# Patient Record
Sex: Male | Born: 1980
Health system: Southern US, Community
[De-identification: ages and names within clinical notes are randomized; demographics above are authoritative.]

## PROBLEM LIST (undated history)

## (undated) DIAGNOSIS — L409 Psoriasis, unspecified: Secondary | ICD-10-CM

## (undated) DIAGNOSIS — G8929 Other chronic pain: Secondary | ICD-10-CM

## (undated) DIAGNOSIS — D509 Iron deficiency anemia, unspecified: Secondary | ICD-10-CM

## (undated) DIAGNOSIS — D696 Thrombocytopenia, unspecified: Secondary | ICD-10-CM

## (undated) DIAGNOSIS — F419 Anxiety disorder, unspecified: Secondary | ICD-10-CM

## (undated) HISTORY — PX: GYNECOMASTIA EXCISION: SHX5170

## (undated) HISTORY — DX: Anxiety disorder, unspecified: F41.9

## (undated) HISTORY — PX: HERNIA REPAIR: SHX51

---

## 2013-07-20 ENCOUNTER — Encounter: Payer: Self-pay | Admitting: Internal Medicine

## 2013-07-20 ENCOUNTER — Ambulatory Visit (INDEPENDENT_AMBULATORY_CARE_PROVIDER_SITE_OTHER): Payer: 59 | Admitting: Internal Medicine

## 2013-07-20 VITALS — BP 120/80 | HR 84 | Temp 97.9°F | Ht 71.0 in | Wt 209.0 lb

## 2013-07-20 DIAGNOSIS — Z0000 Encounter for general adult medical examination without abnormal findings: Secondary | ICD-10-CM

## 2013-07-20 DIAGNOSIS — L723 Sebaceous cyst: Secondary | ICD-10-CM | POA: Insufficient documentation

## 2013-07-20 DIAGNOSIS — B354 Tinea corporis: Secondary | ICD-10-CM | POA: Insufficient documentation

## 2013-07-20 MED ORDER — KETOCONAZOLE 2 % EX FOAM: 1.0000 "application " | Freq: Every day | CUTANEOUS | Status: DC

## 2013-07-20 NOTE — Assessment & Plan Note (Signed)
eRx for ketaconazole cream

## 2013-07-20 NOTE — Patient Instructions (Signed)
 Preventive Care for Adults, Male A healthy lifestyle and preventive care can promote health and wellness. Preventive health guidelines for men include the following key practices:  A routine yearly physical is a good way to check with your caregiver about your health and preventative screening. It is a chance to share any concerns and updates on your health, and to receive a thorough exam.  Visit your dentist for a routine exam and preventative care every 6 months. Brush your teeth twice a day and floss once a day. Good oral hygiene prevents tooth decay and gum disease.  The frequency of eye exams is based on your age, health, family medical history, use of contact lenses, and other factors. Follow your caregiver's recommendations for frequency of eye exams.  Eat a healthy diet. Foods like vegetables, fruits, whole grains, low-fat dairy products, and lean protein foods contain the nutrients you need without too many calories. Decrease your intake of foods high in solid fats, added sugars, and salt. Eat the right amount of calories for you.Get information about a proper diet from your caregiver, if necessary.  Regular physical exercise is one of the most important things you can do for your health. Most adults should get at least 150 minutes of moderate-intensity exercise (any activity that increases your heart rate and causes you to sweat) each week. In addition, most adults need muscle-strengthening exercises on 2 or more days a week.  Maintain a healthy weight. The body mass index (BMI) is a screening tool to identify possible weight problems. It provides an estimate of body fat based on height and weight. Your caregiver can help determine your BMI, and can help you achieve or maintain a healthy weight.For adults 20 years and older:  A BMI below 18.5 is considered underweight.  A BMI of 18.5 to 24.9 is normal.  A BMI of 25 to 29.9 is considered overweight.  A BMI of 30 and above is  considered obese.  Maintain normal blood lipids and cholesterol levels by exercising and minimizing your intake of saturated fat. Eat a balanced diet with plenty of fruit and vegetables. Blood tests for lipids and cholesterol should begin at age 20 and be repeated every 5 years. If your lipid or cholesterol levels are high, you are over 50, or you are a high risk for heart disease, you may need your cholesterol levels checked more frequently.Ongoing high lipid and cholesterol levels should be treated with medicines if diet and exercise are not effective.  If you smoke, find out from your caregiver how to quit. If you do not use tobacco, do not start.  Lung cancer screening is recommended for adults aged 55 80 years who are at high risk for developing lung cancer because of a history of smoking. Yearly low-dose computed tomography (CT) is recommended for people who have at least a 30-pack-year history of smoking and are a current smoker or have quit within the past 15 years. A pack year of smoking is smoking an average of 1 pack of cigarettes a day for 1 year (for example: 1 pack a day for 30 years or 2 packs a day for 15 years). Yearly screening should continue until the smoker has stopped smoking for at least 15 years. Yearly screening should also be stopped for people who develop a health problem that would prevent them from having lung cancer treatment.  If you choose to drink alcohol, do not exceed 2 drinks per day. One drink is considered to be 12   ounces (355 mL) of beer, 5 ounces (148 mL) of wine, or 1.5 ounces (44 mL) of liquor.  Avoid use of street drugs. Do not share needles with anyone. Ask for help if you need support or instructions about stopping the use of drugs.  High blood pressure causes heart disease and increases the risk of stroke. Your blood pressure should be checked at least every 1 to 2 years. Ongoing high blood pressure should be treated with medicines, if weight loss and  exercise are not effective.  If you are 45 to 31 years old, ask your caregiver if you should take aspirin to prevent heart disease.  Diabetes screening involves taking a blood sample to check your fasting blood sugar level. This should be done once every 3 years, after age 45, if you are within normal weight and without risk factors for diabetes. Testing should be considered at a younger age or be carried out more frequently if you are overweight and have at least 1 risk factor for diabetes.  Colorectal cancer can be detected and often prevented. Most routine colorectal cancer screening begins at the age of 50 and continues through age 75. However, your caregiver may recommend screening at an earlier age if you have risk factors for colon cancer. On a yearly basis, your caregiver may provide home test kits to check for hidden blood in the stool. Use of a small camera at the end of a tube, to directly examine the colon (sigmoidoscopy or colonoscopy), can detect the earliest forms of colorectal cancer. Talk to your caregiver about this at age 50, when routine screening begins. Direct examination of the colon should be repeated every 5 to 10 years through age 75, unless early forms of pre-cancerous polyps or small growths are found.  Hepatitis C blood testing is recommended for all people born from 1945 through 1965 and any individual with known risks for hepatitis C.  Practice safe sex. Use condoms and avoid high-risk sexual practices to reduce the spread of sexually transmitted infections (STIs). STIs include gonorrhea, chlamydia, syphilis, trichomonas, herpes, HPV, and human immunodeficiency virus (HIV). Herpes, HIV, and HPV are viral illnesses that have no cure. They can result in disability, cancer, and death.  A one-time screening for abdominal aortic aneurysm (AAA) and surgical repair of large AAAs by sound wave imaging (ultrasonography) is recommended for ages 65 to 75 years who are current or  former smokers.  Healthy men should no longer receive prostate-specific antigen (PSA) blood tests as part of routine cancer screening. Consult with your caregiver about prostate cancer screening.  Testicular cancer screening is not recommended for adult males who have no symptoms. Screening includes self-exam, caregiver exam, and other screening tests. Consult with your caregiver about any symptoms you have or any concerns you have about testicular cancer.  Use sunscreen. Apply sunscreen liberally and repeatedly throughout the day. You should seek shade when your shadow is shorter than you. Protect yourself by wearing long sleeves, pants, a wide-brimmed hat, and sunglasses year round, whenever you are outdoors.  Once a month, do a whole body skin exam, using a mirror to look at the skin on your back. Notify your caregiver of new moles, moles that have irregular borders, moles that are larger than a pencil eraser, or moles that have changed in shape or color.  Stay current with required immunizations.  Influenza vaccine. All adults should be immunized every year.  Tetanus, diphtheria, and acellular pertussis (Td, Tdap) vaccine. An adult who has not   previously received Tdap or who does not know his vaccine status should receive 1 dose of Tdap. This initial dose should be followed by tetanus and diphtheria toxoids (Td) booster doses every 10 years. Adults with an unknown or incomplete history of completing a 3-dose immunization series with Td-containing vaccines should begin or complete a primary immunization series including a Tdap dose. Adults should receive a Td booster every 10 years.  Varicella vaccine. An adult without evidence of immunity to varicella should receive 2 doses or a second dose if he has previously received 1 dose.  Human papillomavirus (HPV) vaccine. Males aged 13 21 years who have not received the vaccine previously should receive the 3-dose series. Males aged 22 26 years may be  immunized. Immunization is recommended through the age of 26 years for any male who has sex with males and did not get any or all doses earlier. Immunization is recommended for any person with an immunocompromised condition through the age of 26 years if he did not get any or all doses earlier. During the 3-dose series, the second dose should be obtained 4 8 weeks after the first dose. The third dose should be obtained 24 weeks after the first dose and 16 weeks after the second dose.  Zoster vaccine. One dose is recommended for adults aged 60 years or older unless certain conditions are present.  Measles, mumps, and rubella (MMR) vaccine. Adults born before 1957 generally are considered immune to measles and mumps. Adults born in 1957 or later should have 1 or more doses of MMR vaccine unless there is a contraindication to the vaccine or there is laboratory evidence of immunity to each of the three diseases. A routine second dose of MMR vaccine should be obtained at least 28 days after the first dose for students attending postsecondary schools, health care workers, or international travelers. People who received inactivated measles vaccine or an unknown type of measles vaccine during 1963 1967 should receive 2 doses of MMR vaccine. People who received inactivated mumps vaccine or an unknown type of mumps vaccine before 1979 and are at high risk for mumps infection should consider immunization with 2 doses of MMR vaccine. Unvaccinated health care workers born before 1957 who lack laboratory evidence of measles, mumps, or rubella immunity or laboratory confirmation of disease should consider measles and mumps immunization with 2 doses of MMR vaccine or rubella immunization with 1 dose of MMR vaccine.  Pneumococcal 13-valent conjugate (PCV13) vaccine. When indicated, a person who is uncertain of his immunization history and has no record of immunization should receive the PCV13 vaccine. An adult aged 19 years or  older who has certain medical conditions and has not been previously immunized should receive 1 dose of PCV13 vaccine. This PCV13 should be followed with a dose of pneumococcal polysaccharide (PPSV23) vaccine. The PPSV23 vaccine dose should be obtained at least 8 weeks after the dose of PCV13 vaccine. An adult aged 19 years or older who has certain medical conditions and previously received 1 or more doses of PPSV23 vaccine should receive 1 dose of PCV13. The PCV13 vaccine dose should be obtained 1 or more years after the last PPSV23 vaccine dose.  Pneumococcal polysaccharide (PPSV23) vaccine. When PCV13 is also indicated, PCV13 should be obtained first. All adults aged 65 years and older should be immunized. An adult younger than age 65 years who has certain medical conditions should be immunized. Any person who resides in a nursing home or long-term care facility should be   immunized. An adult smoker should be immunized. People with an immunocompromised condition and certain other conditions should receive both PCV13 and PPSV23 vaccines. People with human immunodeficiency virus (HIV) infection should be immunized as soon as possible after diagnosis. Immunization during chemotherapy or radiation therapy should be avoided. Routine use of PPSV23 vaccine is not recommended for American Indians, [place] Natives, or people younger than 65 years unless there are medical conditions that require PPSV23 vaccine. When indicated, people who have unknown immunization and have no record of immunization should receive PPSV23 vaccine. One-time revaccination 5 years after the first dose of PPSV23 is recommended for people aged 19 64 years who have chronic kidney failure, nephrotic syndrome, asplenia, or immunocompromised conditions. People who received 1 2 doses of PPSV23 before age 65 years should receive another dose of PPSV23 vaccine at age 65 years or later if at least 5 years have passed since the previous dose. Doses of  PPSV23 are not needed for people immunized with PPSV23 at or after age 65 years.  Meningococcal vaccine. Adults with asplenia or persistent complement component deficiencies should receive 2 doses of quadrivalent meningococcal conjugate (MenACWY-D) vaccine. The doses should be obtained at least 2 months apart. Microbiologists working with certain meningococcal bacteria, military recruits, people at risk during an outbreak, and people who travel to or live in countries with a high rate of meningitis should be immunized. A first-year college student up through age 21 years who is living in a residence hall should receive a dose if he did not receive a dose on or after his 16th birthday. Adults who have certain high-risk conditions should receive one or more doses of vaccine.  Hepatitis A vaccine. Adults who wish to be protected from this disease, have certain high-risk conditions, work with hepatitis A-infected animals, work in hepatitis A research labs, or travel to or work in countries with a high rate of hepatitis A should be immunized. Adults who were previously unvaccinated and who anticipate close contact with an international adoptee during the first 60 days after arrival in the [place] from a country with a high rate of hepatitis A should be immunized.  Hepatitis B vaccine. Adults who wish to be protected from this disease, have certain high-risk conditions, may be exposed to blood or other infectious body fluids, are household contacts or sex partners of hepatitis B positive people, are clients or workers in certain care facilities, or travel to or work in countries with a high rate of hepatitis B should be immunized.  Haemophilus influenzae type b (Hib) vaccine. A previously unvaccinated person with asplenia or sickle cell disease or having a scheduled splenectomy should receive 1 dose of Hib vaccine. Regardless of previous immunization, a recipient of a hematopoietic stem cell transplant  should receive a 3-dose series 6 12 months after his successful transplant. Hib vaccine is not recommended for adults with HIV infection. Preventive Service / Frequency Ages 19 to 39  Blood pressure check.** / Every 1 to 2 years.  Lipid and cholesterol check.** / Every 5 years beginning at age 20.  Hepatitis C blood test.** / For any individual with known risks for hepatitis C.  Skin self-exam. / Monthly.  Influenza vaccine. / Every year.  Tetanus, diphtheria, and acellular pertussis (Tdap, Td) vaccine.** / Consult your caregiver. 1 dose of Td every 10 years.  Varicella vaccine.** / Consult your caregiver.  HPV vaccine. / 3 doses over 6 months, if 26 and younger.  Measles, mumps, rubella (MMR) vaccine.** /   You need at least 1 dose of MMR if you were born in 1957 or later. You may also need a 2nd dose.  Pneumococcal 13-valent conjugate (PCV13) vaccine.** / Consult your caregiver.  Pneumococcal polysaccharide (PPSV23) vaccine.** / 1 to 2 doses if you smoke cigarettes or if you have certain conditions.  Meningococcal vaccine.** / 1 dose if you are age 19 to 21 years and a first-year college student living in a residence hall, or have one of several medical conditions, you need to get vaccinated against meningococcal disease. You may also need additional booster doses.  Hepatitis A vaccine.** / Consult your caregiver.  Hepatitis B vaccine.** / Consult your caregiver.  Haemophilus influenzae type b (Hib) vaccine.** / Consult your caregiver. Ages 40 to 64  Blood pressure check.** / Every 1 to 2 years.  Lipid and cholesterol check.** / Every 5 years beginning at age 20.  Lung cancer screening. / Every year if you are aged 55 80 years and have a 30-pack-year history of smoking and currently smoke or have quit within the past 15 years. Yearly screening is stopped once you have quit smoking for at least 15 years or develop a health problem that would prevent you from having lung cancer  treatment.  Fecal occult blood test (FOBT) of stool. / Every year beginning at age 50 and continuing until age 75. You may not have to do this test if you get colonoscopy every 10 years.  Flexible sigmoidoscopy** or colonoscopy.** / Every 5 years for a flexible sigmoidoscopy or every 10 years for a colonoscopy beginning at age 50 and continuing until age 75.  Hepatitis C blood test.** / For all people born from 1945 through 1965 and any individual with known risks for hepatitis C.  Skin self-exam. / Monthly.  Influenza vaccine. / Every year.  Tetanus, diphtheria, and acellular pertussis (Tdap/Td) vaccine.** / Consult your caregiver. 1 dose of Td every 10 years.  Varicella vaccine.** / Consult your caregiver.  Zoster vaccine.** / 1 dose for adults aged 60 years or older.  Measles, mumps, rubella (MMR) vaccine.** / You need at least 1 dose of MMR if you were born in 1957 or later. You may also need a 2nd dose.  Pneumococcal 13-valent conjugate (PCV13) vaccine.** / Consult your caregiver.  Pneumococcal polysaccharide (PPSV23) vaccine.** / 1 to 2 doses if you smoke cigarettes or if you have certain conditions.  Meningococcal vaccine.** / Consult your caregiver.  Hepatitis A vaccine.** / Consult your caregiver.  Hepatitis B vaccine.** / Consult your caregiver.  Haemophilus influenzae type b (Hib) vaccine.** / Consult your caregiver. Ages 65 and over  Blood pressure check.** / Every 1 to 2 years.  Lipid and cholesterol check.**/ Every 5 years beginning at age 20.  Lung cancer screening. / Every year if you are aged 55 80 years and have a 30-pack-year history of smoking and currently smoke or have quit within the past 15 years. Yearly screening is stopped once you have quit smoking for at least 15 years or develop a health problem that would prevent you from having lung cancer treatment.  Fecal occult blood test (FOBT) of stool. / Every year beginning at age 50 and continuing until  age 75. You may not have to do this test if you get colonoscopy every 10 years.  Flexible sigmoidoscopy** or colonoscopy.** / Every 5 years for a flexible sigmoidoscopy or every 10 years for a colonoscopy beginning at age 50 and continuing until age 75.  Hepatitis C blood   test.** / For all people born from 1945 through 1965 and any individual with known risks for hepatitis C.  Abdominal aortic aneurysm (AAA) screening.** / A one-time screening for ages 65 to 75 years who are current or former smokers.  Skin self-exam. / Monthly.  Influenza vaccine. / Every year.  Tetanus, diphtheria, and acellular pertussis (Tdap/Td) vaccine.** / 1 dose of Td every 10 years.  Varicella vaccine.** / Consult your caregiver.  Zoster vaccine.** / 1 dose for adults aged 60 years or older.  Pneumococcal 13-valent conjugate (PCV13) vaccine.** / Consult your caregiver.  Pneumococcal polysaccharide (PPSV23) vaccine.** / 1 dose for all adults aged 65 years and older.  Meningococcal vaccine.** / Consult your caregiver.  Hepatitis A vaccine.** / Consult your caregiver.  Hepatitis B vaccine.** / Consult your caregiver.  Haemophilus influenzae type b (Hib) vaccine.** / Consult your caregiver. **Family history and personal history of risk and conditions may change your caregiver's recommendations. Document Released: 09/24/2001 Document Revised: 11/23/2012 Document Reviewed: 12/24/2010 [employer] Patient Information 2014 [employer].  

## 2013-07-20 NOTE — Progress Notes (Signed)
Subjective:    Patient ID: Patrick Hahn, male    DOB: Aug 10, 1981, 32 y.o.   MRN: [id number]  HPI Pt presents to the clinic today to establish care. He does not have a PCP. He does have some concerns today about a rash around his neck. It has been there for a few months. It does not itch or hurt. He also has a lump on his upper left chest. It has been there for years. It does not hurt. He would like to know what it is.  Flu: never Tetanus: around 2008 Dentist: as needed  Review of Systems      History reviewed. No pertinent past medical history.  No current outpatient prescriptions on file.   No current facility-administered medications for this visit.    No Known Allergies  Family History  Problem Relation Age of Onset  . Cancer Maternal Uncle     throat  . Diabetes Neg Hx   . Early death Neg Hx   . Heart disease Neg Hx   . Stroke Neg Hx     History   Social History  . Marital Status: Single    Spouse Name: N/A    Number of Children: N/A  . Years of Education: N/A   Occupational History  . Not on file.   Social History Main Topics  . Smoking status: Never Smoker   . Smokeless tobacco: Never Used  . Alcohol Use: 0.6 oz/week    1 Shots of liquor per week  . Drug Use: No  . Sexual Activity: Yes   Other Topics Concern  . Not on file   Social History Narrative  . No narrative on file     Constitutional: Denies fever, malaise, fatigue, headache or abrupt weight changes.  HEENT: Denies eye pain, eye redness, ear pain, ringing in the ears, wax buildup, runny nose, nasal congestion, bloody nose, or sore throat. Respiratory: Denies difficulty breathing, shortness of breath, cough or sputum production.   Cardiovascular: Denies chest pain, chest tightness, palpitations or swelling in the hands or feet.  Gastrointestinal: Denies abdominal pain, bloating, constipation, diarrhea or blood in the stool.  GU: Denies urgency, frequency, pain with urination, burning  sensation, blood in urine, odor or discharge. Musculoskeletal: Denies decrease in range of motion, difficulty with gait, muscle pain or joint pain and swelling.  Skin: Denies redness, lesions or ulcercations.  Neurological: Denies dizziness, difficulty with memory, difficulty with speech or problems with balance and coordination.   No other specific complaints in a complete review of systems (except as listed in HPI above).  Objective:   Physical Exam   BP 120/80  Pulse 84  Temp(Src) 97.9 F (36.6 C) (Tympanic)  Ht 5\' 11"  (1.803 m)  Wt 209 lb (94.802 kg)  BMI 29.16 kg/m2  SpO2 99% Wt Readings from Last 3 Encounters:  07/20/13 209 lb (94.802 kg)    General: Appears his stated age, well developed, well nourished in NAD. Skin: Warm, dry and intact. Fungal infection noted around neck, dime sized sebaceous cyst noted on left upper chest. HEENT: Head: normal shape and size; Eyes: sclera white, no icterus, conjunctiva pink, PERRLA and EOMs intact; Ears: Tm's gray and intact, normal light reflex; Nose: mucosa pink and moist, septum midline; Throat/Mouth: Teeth present, mucosa pink and moist, no exudate, lesions or ulcerations noted.  Neck: Normal range of motion. Neck supple, trachea midline. No massses, lumps or thyromegaly present.  Cardiovascular: Normal rate and rhythm. S1,S2 noted.  No murmur, rubs  or gallops noted. No JVD or BLE edema. No carotid bruits noted. Pulmonary/Chest: Normal effort and positive vesicular breath sounds. No respiratory distress. No wheezes, rales or ronchi noted.  Abdomen: Soft and nontender. Normal bowel sounds, no bruits noted. No distention or masses noted. Liver, spleen and kidneys non palpable. Musculoskeletal: Normal range of motion. No signs of joint swelling. No difficulty with gait.  Neurological: Alert and oriented. Cranial nerves II-XII intact. Coordination normal. +DTRs bilaterally. Psychiatric: Mood and affect normal. Behavior is normal. Judgment and  thought content normal.         Assessment & Plan:   Preventative Health Maintenance:  Pt declines flu vaccine today Will obtain basic screening labs Pt advised to work on diet and exercise

## 2013-07-20 NOTE — Progress Notes (Signed)
Pre-visit discussion using our clinic review tool. No additional management support is needed unless otherwise documented below in the visit note.  

## 2013-07-20 NOTE — Assessment & Plan Note (Signed)
Not infected Continue to monitor

## 2013-07-21 LAB — COMPREHENSIVE METABOLIC PANEL
Alkaline Phosphatase: 57 U/L (ref 39–117)
BUN: 11 mg/dL (ref 6–23)
Creatinine, Ser: 0.9 mg/dL (ref 0.4–1.5)
Glucose, Bld: 93 mg/dL (ref 70–99)
Sodium: 139 mEq/L (ref 135–145)
Total Bilirubin: 1.4 mg/dL — ABNORMAL HIGH (ref 0.3–1.2)

## 2013-07-21 LAB — LIPID PANEL
Cholesterol: 103 mg/dL (ref 0–200)
HDL: 40.1 mg/dL (ref >39.00)
LDL Cholesterol: 53 mg/dL (ref 0–99)
Triglycerides: 50 mg/dL (ref 0.0–149.0)
VLDL: 10 mg/dL (ref 0.0–40.0)

## 2013-07-21 LAB — CBC
HCT: 36.3 % — ABNORMAL LOW (ref 39.0–52.0)
MCV: 86.4 fl (ref 78.0–100.0)
Platelets: 153 10*3/uL (ref 150.0–400.0)
RBC: 4.2 Mil/uL — ABNORMAL LOW (ref 4.22–5.81)
RDW: 16.5 % — ABNORMAL HIGH (ref 11.5–14.6)

## 2013-07-23 ENCOUNTER — Encounter: Payer: Self-pay | Admitting: *Deleted

## 2013-09-09 ENCOUNTER — Ambulatory Visit (INDEPENDENT_AMBULATORY_CARE_PROVIDER_SITE_OTHER): Payer: 59 | Admitting: Internal Medicine

## 2013-09-09 ENCOUNTER — Encounter: Payer: Self-pay | Admitting: Internal Medicine

## 2013-09-09 VITALS — BP 129/80 | HR 91 | Temp 97.9°F | Wt 205.2 lb

## 2013-09-09 DIAGNOSIS — IMO0002 Reserved for concepts with insufficient information to code with codable children: Secondary | ICD-10-CM

## 2013-09-09 DIAGNOSIS — M7051 Other bursitis of knee, right knee: Secondary | ICD-10-CM

## 2013-09-09 MED ORDER — KETOCONAZOLE 2 % EX CREA
1.0000 | TOPICAL_CREAM | Freq: Every day | CUTANEOUS | Status: DC
Start: 2013-09-09 — End: 2014-11-29

## 2013-09-09 NOTE — Patient Instructions (Signed)
Bursitis Bursitis is a swelling and soreness (inflammation) of a fluid-filled sac (bursa) that overlies and protects a joint. It can be caused by injury, overuse of the joint, arthritis or infection. The joints most likely to be affected are the elbows, shoulders, hips and knees. HOME CARE INSTRUCTIONS   Apply ice to the affected area for 15-20 minutes each hour while awake for 2 days. Put the ice in a plastic bag and place a towel between the bag of ice and your skin.  Rest the injured joint as much as possible, but continue to put the joint through a full range of motion, 4 times per day. (The shoulder joint especially becomes rapidly "frozen" if not used.) When the pain lessens, begin normal slow movements and usual activities.  Only take over-the-counter or prescription medicines for pain, discomfort or fever as directed by your caregiver.  Your caregiver may recommend draining the bursa and injecting medicine into the bursa. This may help the healing process.  Follow all instructions for follow-up with your caregiver. This includes any orthopedic referrals, physical therapy and rehabilitation. Any delay in obtaining necessary care could result in a delay or failure of the bursitis to heal and chronic pain. SEEK IMMEDIATE MEDICAL CARE IF:   Your pain increases even during treatment.  You develop an oral temperature above 102 F (38.9 C) and have heat and inflammation over the involved bursa. MAKE SURE YOU:   Understand these instructions.  Will watch your condition.  Will get help right away if you are not doing well or get worse. Document Released: 07/26/2000 Document Revised: 10/21/2011 Document Reviewed: 06/30/2009 Exit[employer] Patient Information 2014 [employer].  

## 2013-09-09 NOTE — Progress Notes (Signed)
Pre-visit discussion using our clinic review tool. No additional management support is needed unless otherwise documented below in the visit note.  

## 2013-09-09 NOTE — Progress Notes (Signed)
Subjective:    Patient ID: Patrick Hahn, male    DOB: 08-29-1980, 33 y.o.   MRN: [id number]  HPI  Pt presents to the clinic today with c/o right knee pain. He reports this started 1 week ago. He did start working out at [employer]. He had been run/walking on the treadmill and playing basketball at that time. He did notice some soreness at that time. Then yesterday his pain had significantly increased. He did have pain with bending the knee. He denies any specific injury. He did use icy hot without much relief. He denies numbness or tingling in the leg, redness or swelling.  Review of Systems      History reviewed. No pertinent past medical history.  Current Outpatient Prescriptions  Medication Sig Dispense Refill  . Ketoconazole 2 % FOAM Apply 1 application topically daily.  100 g  0   No current facility-administered medications for this visit.    No Known Allergies  Family History  Problem Relation Age of Onset  . Cancer Maternal Uncle     throat  . Diabetes Neg Hx   . Early death Neg Hx   . Heart disease Neg Hx   . Stroke Neg Hx     History   Social History  . Marital Status: Single    Spouse Name: N/A    Number of Children: N/A  . Years of Education: N/A   Occupational History  . Not on file.   Social History Main Topics  . Smoking status: Never Smoker   . Smokeless tobacco: Never Used  . Alcohol Use: 0.6 oz/week    1 Shots of liquor per week  . Drug Use: No  . Sexual Activity: Yes   Other Topics Concern  . Not on file   Social History Narrative  . No narrative on file     Constitutional: Denies fever, malaise, fatigue, headache or abrupt weight changes.  Musculoskeletal: Pt reports right knee pain. Denies muscle pain or joint pain and swelling.  Skin: Denies redness, rashes, lesions or ulcercations.    No other specific complaints in a complete review of systems (except as listed in HPI above).  Objective:   Physical Exam   BP 129/80   Pulse 91  Temp(Src) 97.9 F (36.6 C) (Oral)  Wt 205 lb 4 oz (93.101 kg)  SpO2 98% Wt Readings from Last 3 Encounters:  09/09/13 205 lb 4 oz (93.101 kg)  07/20/13 209 lb (94.802 kg)    General: Appears his stated age, well developed, well nourished in NAD. Cardiovascular: Normal rate and rhythm. S1,S2 noted.  No murmur, rubs or gallops noted. No JVD or BLE edema. No carotid bruits noted. Pulmonary/Chest: Normal effort and positive vesicular breath sounds. No respiratory distress. No wheezes, rales or ronchi noted.  Musculoskeletal: Normal flexion and extension of the right knee. Pain noted at the medial bursa of the right knee. Strength 5/5 BLE. Negative Lachman's, negative Mcmurry.  BMET    Component Value Date/Time   NA 139 07/20/2013 1539   K 3.7 07/20/2013 1539   CL 105 07/20/2013 1539   CO2 26 07/20/2013 1539   GLUCOSE 93 07/20/2013 1539   BUN 11 07/20/2013 1539   CREATININE 0.9 07/20/2013 1539   CALCIUM 8.8 07/20/2013 1539    Lipid Panel     Component Value Date/Time   CHOL 103 07/20/2013 1539   TRIG 50.0 07/20/2013 1539   HDL 40.10 07/20/2013 1539   CHOLHDL 3 07/20/2013 1539  VLDL 10.0 07/20/2013 1539   LDLCALC 53 07/20/2013 1539    CBC    Component Value Date/Time   WBC 5.7 07/20/2013 1539   RBC 4.20* 07/20/2013 1539   HGB 12.2* 07/20/2013 1539   HCT 36.3* 07/20/2013 1539   PLT 153.0 07/20/2013 1539   MCV 86.4 07/20/2013 1539   MCHC 33.6 07/20/2013 1539   RDW 16.5* 07/20/2013 1539    Hgb A1C No results found for this basename: HGBA1C        Assessment & Plan:   Pes Bursitis of right knee:  Discussed the different options He would like to try 600 mg Ibuprofen BID x 1 week- take with food Advised him to not walk/run on the treadmill this week If no improvement, will consider steroid injection  RTC as needed or if symptoms persist or worsen

## 2014-01-25 ENCOUNTER — Encounter: Payer: Self-pay | Admitting: Internal Medicine

## 2014-01-25 ENCOUNTER — Ambulatory Visit (INDEPENDENT_AMBULATORY_CARE_PROVIDER_SITE_OTHER): Payer: 59 | Admitting: Internal Medicine

## 2014-01-25 ENCOUNTER — Encounter (INDEPENDENT_AMBULATORY_CARE_PROVIDER_SITE_OTHER): Payer: Self-pay

## 2014-01-25 VITALS — BP 118/76 | HR 89 | Temp 98.1°F | Wt 207.0 lb

## 2014-01-25 DIAGNOSIS — M545 Low back pain, unspecified: Secondary | ICD-10-CM

## 2014-01-25 MED ORDER — MELOXICAM 15 MG PO TABS: 15.0000 mg | ORAL_TABLET | Freq: Every day | ORAL | Status: DC

## 2014-01-25 NOTE — Patient Instructions (Addendum)
Back Exercises These exercises may help you when beginning to rehabilitate your injury. Your symptoms may resolve with or without further involvement from your physician, physical therapist or athletic trainer. While completing these exercises, remember:   Restoring tissue flexibility helps normal motion to return to the joints. This allows healthier, less painful movement and activity.  An effective stretch should be held for at least 30 seconds.  A stretch should never be painful. You should only feel a gentle lengthening or release in the stretched tissue. STRETCH  Extension, Prone on Elbows   Lie on your stomach on the floor, a bed will be too soft. Place your palms about shoulder width apart and at the height of your head.  Place your elbows under your shoulders. If this is too painful, stack pillows under your chest.  Allow your body to relax so that your hips drop lower and make contact more completely with the floor.  Hold this position for __________ seconds.  Slowly return to lying flat on the floor. Repeat __________ times. Complete this exercise __________ times per day.  RANGE OF MOTION  Extension, Prone Press Ups   Lie on your stomach on the floor, a bed will be too soft. Place your palms about shoulder width apart and at the height of your head.  Keeping your back as relaxed as possible, slowly straighten your elbows while keeping your hips on the floor. You may adjust the placement of your hands to maximize your comfort. As you gain motion, your hands will come more underneath your shoulders.  Hold this position __________ seconds.  Slowly return to lying flat on the floor. Repeat __________ times. Complete this exercise __________ times per day.  RANGE OF MOTION- Quadruped, Neutral Spine   Assume a hands and knees position on a firm surface. Keep your hands under your shoulders and your knees under your hips. You may place padding under your knees for comfort.  Drop  your head and point your tail bone toward the ground below you. This will round out your low back like an angry cat. Hold this position for __________ seconds.  Slowly lift your head and release your tail bone so that your back sags into a large arch, like an old horse.  Hold this position for __________ seconds.  Repeat this until you feel limber in your low back.  Now, find your "sweet spot." This will be the most comfortable position somewhere between the two previous positions. This is your neutral spine. Once you have found this position, tense your stomach muscles to support your low back.  Hold this position for __________ seconds. Repeat __________ times. Complete this exercise __________ times per day.  STRETCH  Flexion, Single Knee to Chest   Lie on a firm bed or floor with both legs extended in front of you.  Keeping one leg in contact with the floor, bring your opposite knee to your chest. Hold your leg in place by either grabbing behind your thigh or at your knee.  Pull until you feel a gentle stretch in your low back. Hold __________ seconds.  Slowly release your grasp and repeat the exercise with the opposite side. Repeat __________ times. Complete this exercise __________ times per day.  STRETCH - Hamstrings, Standing  Stand or sit and extend your right / left leg, placing your foot on a chair or foot stool  Keeping a slight arch in your low back and your hips straight forward.  Lead with your chest and   lean forward at the waist until you feel a gentle stretch in the back of your right / left knee or thigh. (When done correctly, this exercise requires leaning only a small distance.)  Hold this position for __________ seconds. Repeat __________ times. Complete this stretch __________ times per day. STRENGTHENING  Deep Abdominals, Pelvic Tilt   Lie on a firm bed or floor. Keeping your legs in front of you, bend your knees so they are both pointed toward the ceiling and  your feet are flat on the floor.  Tense your lower abdominal muscles to press your low back into the floor. This motion will rotate your pelvis so that your tail bone is scooping upwards rather than pointing at your feet or into the floor.  With a gentle tension and even breathing, hold this position for __________ seconds. Repeat __________ times. Complete this exercise __________ times per day.  STRENGTHENING  Abdominals, Crunches   Lie on a firm bed or floor. Keeping your legs in front of you, bend your knees so they are both pointed toward the ceiling and your feet are flat on the floor. Cross your arms over your chest.  Slightly tip your chin down without bending your neck.  Tense your abdominals and slowly lift your trunk high enough to just clear your shoulder blades. Lifting higher can put excessive stress on the low back and does not further strengthen your abdominal muscles.  Control your return to the starting position. Repeat __________ times. Complete this exercise __________ times per day.  STRENGTHENING  Quadruped, Opposite UE/LE Lift   Assume a hands and knees position on a firm surface. Keep your hands under your shoulders and your knees under your hips. You may place padding under your knees for comfort.  Find your neutral spine and gently tense your abdominal muscles so that you can maintain this position. Your shoulders and hips should form a rectangle that is parallel with the floor and is not twisted.  Keeping your trunk steady, lift your right hand no higher than your shoulder and then your left leg no higher than your hip. Make sure you are not holding your breath. Hold this position __________ seconds.  Continuing to keep your abdominal muscles tense and your back steady, slowly return to your starting position. Repeat with the opposite arm and leg. Repeat __________ times. Complete this exercise __________ times per day. Document Released: 08/16/2005 Document  Revised: 10/21/2011 Document Reviewed: 11/10/2008 Exit[employer] Patient Information 2014 [employer].  

## 2014-01-25 NOTE — Progress Notes (Signed)
Subjective:    Patient ID: Patrick Hahn, male    DOB: 01/23/1981, 33 y.o.   MRN: [id number]  HPI  Pt presents to the clinic today with c/o low back pain. He reports this started 4 days ago. It has been intermittent over the last year. He describes the pain as sharp and stiff. It does not radiate. He denies any specific injury to the area. He was playing basketball when it occurred. He has tried OTC tylenol and tried a heating pad with some relief. He denies loss of bowel or bladder.  Review of Systems      No past medical history on file.  Current Outpatient Prescriptions  Medication Sig Dispense Refill  . ketoconazole (NIZORAL) 2 % cream Apply 1 application topically daily.  15 g  0   No current facility-administered medications for this visit.    No Known Allergies  Family History  Problem Relation Age of Onset  . Cancer Maternal Uncle     throat  . Diabetes Neg Hx   . Early death Neg Hx   . Heart disease Neg Hx   . Stroke Neg Hx     History   Social History  . Marital Status: Single    Spouse Name: N/A    Number of Children: N/A  . Years of Education: N/A   Occupational History  . Not on file.   Social History Main Topics  . Smoking status: Never Smoker   . Smokeless tobacco: Never Used  . Alcohol Use: 0.6 oz/week    1 Shots of liquor per week     Comment: occasional  . Drug Use: No  . Sexual Activity: Yes   Other Topics Concern  . Not on file   Social History Narrative  . No narrative on file     Constitutional: Denies fever, malaise, fatigue, headache or abrupt weight changes.  Musculoskeletal: Pt reports low back pain. Denies decrease in range of motion, difficulty with gait,  or joint pain and swelling.   Neurological: Denies  problems with balance and coordination.   No other specific complaints in a complete review of systems (except as listed in HPI above).  Objective:   Physical Exam  BP 118/76  Pulse 89  Temp(Src) 98.1 F (36.7  C) (Oral)  Wt 207 lb (93.895 kg)  SpO2 98% Wt Readings from Last 3 Encounters:  01/25/14 207 lb (93.895 kg)  09/09/13 205 lb 4 oz (93.101 kg)  07/20/13 209 lb (94.802 kg)    General: Appears his stated age, well developed, well nourished in NAD. Cardiovascular: Normal rate and rhythm. S1,S2 noted.  No murmur, rubs or gallops noted. No JVD or BLE edema. No carotid bruits noted. Pulmonary/Chest: Normal effort and positive vesicular breath sounds. No respiratory distress. No wheezes, rales or ronchi noted.  Musculoskeletal: Normal flexion, extension and lateral rotation. Strength 5/5 BLE. Tender to palpation on the right lower back. Negative straight leg raise. Neurological: Alert and oriented.  Coordination normal. +DTRs bilaterally.   BMET    Component Value Date/Time   NA 139 07/20/2013 1539   K 3.7 07/20/2013 1539   CL 105 07/20/2013 1539   CO2 26 07/20/2013 1539   GLUCOSE 93 07/20/2013 1539   BUN 11 07/20/2013 1539   CREATININE 0.9 07/20/2013 1539   CALCIUM 8.8 07/20/2013 1539    Lipid Panel     Component Value Date/Time   CHOL 103 07/20/2013 1539   TRIG 50.0 07/20/2013 1539   HDL 40.10  07/20/2013 1539   CHOLHDL 3 07/20/2013 1539   VLDL 10.0 07/20/2013 1539   LDLCALC 53 07/20/2013 1539    CBC    Component Value Date/Time   WBC 5.7 07/20/2013 1539   RBC 4.20* 07/20/2013 1539   HGB 12.2* 07/20/2013 1539   HCT 36.3* 07/20/2013 1539   PLT 153.0 07/20/2013 1539   MCV 86.4 07/20/2013 1539   MCHC 33.6 07/20/2013 1539   RDW 16.5* 07/20/2013 1539    Hgb A1C No results found for this basename: HGBA1C         Assessment & Plan:   Muscle Strain of back:  eRx for Mobic daily-advised not to take with ibuprofen or aleve Stretching exercises given Continue tylenol and heating pad  RTC as needed or if symptoms persist or worsen

## 2014-01-25 NOTE — Progress Notes (Signed)
Pre visit review using our clinic review tool, if applicable. No additional management support is needed unless otherwise documented below in the visit note. 

## 2014-11-16 ENCOUNTER — Telehealth: Payer: Self-pay | Admitting: Internal Medicine

## 2014-11-16 NOTE — Telephone Encounter (Signed)
Have him cancel lab appt prior to physical appt, will do all labs same day

## 2014-11-16 NOTE — Telephone Encounter (Signed)
Pt called he has appointment 11/23/14 for cpx labs.  He would like to be tested for STD also

## 2014-11-18 ENCOUNTER — Other Ambulatory Visit: Payer: Self-pay | Admitting: Internal Medicine

## 2014-11-18 DIAGNOSIS — Z0000 Encounter for general adult medical examination without abnormal findings: Secondary | ICD-10-CM

## 2014-11-18 NOTE — Telephone Encounter (Signed)
Pt called back Patrick Hahn  Canceled lab appointment

## 2014-11-18 NOTE — Telephone Encounter (Signed)
Left message for pt to call office

## 2014-11-23 ENCOUNTER — Other Ambulatory Visit: Payer: Self-pay

## 2014-11-29 ENCOUNTER — Ambulatory Visit (INDEPENDENT_AMBULATORY_CARE_PROVIDER_SITE_OTHER): Payer: 59 | Admitting: Internal Medicine

## 2014-11-29 ENCOUNTER — Encounter: Payer: Self-pay | Admitting: Internal Medicine

## 2014-11-29 VITALS — BP 116/78 | HR 81 | Temp 98.1°F | Ht 71.0 in | Wt 199.0 lb

## 2014-11-29 DIAGNOSIS — R229 Localized swelling, mass and lump, unspecified: Secondary | ICD-10-CM | POA: Diagnosis not present

## 2014-11-29 DIAGNOSIS — M545 Low back pain, unspecified: Secondary | ICD-10-CM

## 2014-11-29 DIAGNOSIS — Z113 Encounter for screening for infections with a predominantly sexual mode of transmission: Secondary | ICD-10-CM

## 2014-11-29 DIAGNOSIS — Z0000 Encounter for general adult medical examination without abnormal findings: Secondary | ICD-10-CM | POA: Diagnosis not present

## 2014-11-29 DIAGNOSIS — M25511 Pain in right shoulder: Secondary | ICD-10-CM | POA: Diagnosis not present

## 2014-11-29 LAB — COMPREHENSIVE METABOLIC PANEL
ALBUMIN: 4 g/dL (ref 3.5–5.2)
ALK PHOS: 49 U/L (ref 39–117)
ALT: 15 U/L (ref 0–53)
AST: 20 U/L (ref 0–37)
BUN: 9 mg/dL (ref 6–23)
CHLORIDE: 102 meq/L (ref 96–112)
CO2: 30 meq/L (ref 19–32)
Calcium: 9.1 mg/dL (ref 8.4–10.5)
Creatinine, Ser: 0.94 mg/dL (ref 0.40–1.50)
GFR: 119 mL/min (ref >60.00)
Glucose, Bld: 88 mg/dL (ref 70–99)
Potassium: 3.9 mEq/L (ref 3.5–5.1)
SODIUM: 136 meq/L (ref 135–145)
Total Bilirubin: 2.1 mg/dL — ABNORMAL HIGH (ref 0.2–1.2)
Total Protein: 7 g/dL (ref 6.0–8.3)

## 2014-11-29 LAB — LIPID PANEL
Cholesterol: 88 mg/dL (ref 0–200)
HDL: 39.5 mg/dL (ref >39.00)
LDL CALC: 43 mg/dL (ref 0–99)
NonHDL: 48.5
TRIGLYCERIDES: 28 mg/dL (ref 0.0–149.0)
Total CHOL/HDL Ratio: 2
VLDL: 5.6 mg/dL (ref 0.0–40.0)

## 2014-11-29 LAB — CBC
HCT: 35.5 % — ABNORMAL LOW (ref 39.0–52.0)
Hemoglobin: 12 g/dL — ABNORMAL LOW (ref 13.0–17.0)
MCHC: 33.9 g/dL (ref 30.0–36.0)
MCV: 84.8 fl (ref 78.0–100.0)
PLATELETS: 148 10*3/uL — AB (ref 150.0–400.0)
RBC: 4.18 Mil/uL — ABNORMAL LOW (ref 4.22–5.81)
RDW: 16.8 % — AB (ref 11.5–15.5)
WBC: 4.6 10*3/uL (ref 4.0–10.5)

## 2014-11-29 NOTE — Patient Instructions (Signed)

## 2014-11-29 NOTE — Addendum Note (Signed)
Addended by: Alvina ChouWALSH, [place] on: 11/29/2014 09:54 AM   Modules accepted: Orders

## 2014-11-29 NOTE — Progress Notes (Signed)
Pre visit review using our clinic review tool, if applicable. No additional management support is needed unless otherwise documented below in the visit note. 

## 2014-11-29 NOTE — Progress Notes (Signed)
Subjective:    Patient ID: Patrick Hahn, male    DOB: 25-Nov-1980, 34 y.o.   MRN: [id number]  HPI  Pt presents to the clinic today for his annual exam.  Flu: never Tetanus: He thinks it may have been in 2008. Dentist: annually  Diet: He eats cereal for breakfast, yogurt, granola, raisins, salad and water Exercise: He is walking every day of the week. He has a fit bit.  He does have some concerns today about right shoulder pain. This started 4 days ago. He describes the pain as achy. It is worse with certain movements. He was at work when this happened, lifting a heavy box. He denies numbness or tingling is his right arm. He does not feel in weakness in the right arm. He has not tried anything OTC.  He still continues to have low back pain. This started around 2009 when he strained a muscle while playing basketball. Since that time he has had intermittent back pain. It flares occasionally. The pain is worse on the sides, left worse than right. He reports it feels tight. He does have some associated muscle spasms. The pain does not radiate down his legs. He denies numbness or tingling. When it flares up, he is usually out of work for 2 days. He treats with Ibuprofen, heat and stretching and the pain usually eases off after a few days. He has FMLA forms he would like to have filled out in case this flares.  He continues to be concerned about this subcutaneous mass on his left chest. It has not grown in size. It is not painful. He is concerned because he reports cancer runs in his family, although he cannot specify what type. He would like further evaluation today.  Review of Systems      No past medical history on file.  Current Outpatient Prescriptions  Medication Sig Dispense Refill  . ketoconazole (NIZORAL) 2 % cream Apply 1 application topically daily. 15 g 0  . meloxicam (MOBIC) 15 MG tablet Take 1 tablet (15 mg total) by mouth daily. 30 tablet 0   No current facility-administered  medications for this visit.    No Known Allergies  Family History  Problem Relation Age of Onset  . Cancer Maternal Uncle     throat  . Diabetes Neg Hx   . Early death Neg Hx   . Heart disease Neg Hx   . Stroke Neg Hx     History   Social History  . Marital Status: Single    Spouse Name: N/A  . Number of Children: N/A  . Years of Education: N/A   Occupational History  . Not on file.   Social History Main Topics  . Smoking status: Never Smoker   . Smokeless tobacco: Never Used  . Alcohol Use: 0.6 oz/week    1 Shots of liquor per week     Comment: occasional  . Drug Use: No  . Sexual Activity: Yes   Other Topics Concern  . Not on file   Social History Narrative  . No narrative on file     Constitutional: Denies fever, malaise, fatigue, headache or abrupt weight changes.  HEENT: Denies eye pain, eye redness, ear pain, ringing in the ears, wax buildup, runny nose, nasal congestion, bloody nose, or sore throat. Respiratory: Denies difficulty breathing, shortness of breath, cough or sputum production.   Cardiovascular: Denies chest pain, chest tightness, palpitations or swelling in the hands or feet.  Gastrointestinal: Denies abdominal pain,  bloating, constipation, diarrhea or blood in the stool.  GU: Denies urgency, frequency, pain with urination, burning sensation, blood in urine, odor or discharge. Musculoskeletal: Pt reports right shoulder pain. Denies decrease in range of motion, difficulty with gait, muscle pain or joint swelling.  Skin: Pt reports mass of left chest wall. Denies redness, rashes, lesions or ulcercations.  Neurological: Denies dizziness, difficulty with memory, difficulty with speech or problems with balance and coordination.  Psych: Pt denies anxiety, depression, SI/HI.  No other specific complaints in a complete review of systems (except as listed in HPI above).  Objective:   Physical Exam  BP 116/78 mmHg  Pulse 81  Temp(Src) 98.1 F  (36.7 C) (Oral)  Ht  (1.803 m)  Wt 199 lb (90.266 kg)  BMI 27.77 kg/m2  SpO2 99% Wt Readings from Last 3 Encounters:  11/29/14 199 lb (90.266 kg)  01/25/14 207 lb (93.895 kg)  09/09/13 205 lb 4 oz (93.101 kg)    General: Appears his stated age, well developed, well nourished in NAD. Skin: Warm, dry and intact. Small, round 1 cm, mobile subcutaneous mass located about 1 cm below the left clavicle, next to the sternal border. HEENT: Head: normal shape and size; Eyes: sclera white, no icterus, conjunctiva pink, PERRLA and EOMs intact; Ears: Tm's gray and intact, normal light reflex; Nose: mucosa pink and moist, septum midline; Throat/Mouth: Teeth present, mucosa pink and moist, no exudate, lesions or ulcerations noted.  Neck:  Neck supple, trachea midline. No masses, lumps or thyromegaly present.  Cardiovascular: Normal rate and rhythm. S1,S2 noted.  No murmur, rubs or gallops noted. No BLE edema.  Pulmonary/Chest: Normal effort and positive vesicular breath sounds. No respiratory distress. No wheezes, rales or ronchi noted.  Abdomen: Soft and nontender. Normal bowel sounds, no bruits noted. No distention or masses noted. Liver, spleen and kidneys non palpable. Musculoskeletal: Normal internal and external rotation of the right shoulder. No pain with palpation. No joint swelling noted. Negative drop can test. Strength 5/5 BUE. Normal flexion, extension and rotation of the spine. No pain with palpation of the paraspinal muscles. Strength 5/5 BLE. No difficulty with gait. Neurological: Alert and oriented. Cranial nerves II-XII grossly intact. Coordination normal.  Psychiatric: Mood and affect normal. Behavior is normal. Judgment and thought content normal.     BMET    Component Value Date/Time   NA 139 07/20/2013 1539   K 3.7 07/20/2013 1539   CL 105 07/20/2013 1539   CO2 26 07/20/2013 1539   GLUCOSE 93 07/20/2013 1539   BUN 11 07/20/2013 1539   CREATININE 0.9 07/20/2013 1539    CALCIUM 8.8 07/20/2013 1539    Lipid Panel     Component Value Date/Time   CHOL 103 07/20/2013 1539   TRIG 50.0 07/20/2013 1539   HDL 40.10 07/20/2013 1539   CHOLHDL 3 07/20/2013 1539   VLDL 10.0 07/20/2013 1539   LDLCALC 53 07/20/2013 1539    CBC    Component Value Date/Time   WBC 5.7 07/20/2013 1539   RBC 4.20* 07/20/2013 1539   HGB 12.2* 07/20/2013 1539   HCT 36.3* 07/20/2013 1539   PLT 153.0 07/20/2013 1539   MCV 86.4 07/20/2013 1539   MCHC 33.6 07/20/2013 1539   RDW 16.5* 07/20/2013 1539    Hgb A1C No results found for: HGBA1C      Assessment & Plan:   Preventative Health Maintenance:  Will obtain CBC, CMET, and Lipid Profile today Encouraged him to work on diet and exercise He  would like STD screening, HIV, RPR, Hep C, urine GC probe  Right shoulder pain:  Likely strained Exam normal today  Discussed proper lifting mechanics Ibuprofen as needed for joint pain If it flares up again, ice may be helpful  Subcutaneous mass:  Likely a sebaceous cyst He is concerned, will obtain ultrasound of soft tissue RTC in 1 year or sooner if needed  Low back pain:  Muscular in nature Probably related to poor ergonomics Discussed proper lifting mechanics Stretching exercises given when it flares, focus on core strengthening Ibuprofen as needed for pain FMLA forms will be filled out, we will call you when these are ready  RTC in 1 year or sooner if needed

## 2014-11-30 ENCOUNTER — Telehealth: Payer: Self-pay | Admitting: Internal Medicine

## 2014-11-30 DIAGNOSIS — Z7689 Persons encountering health services in other specified circumstances: Secondary | ICD-10-CM

## 2014-11-30 LAB — HEPATITIS C ANTIBODY: HCV Ab: NEGATIVE

## 2014-11-30 LAB — GC/CHLAMYDIA PROBE AMP, URINE
Chlamydia, Swab/Urine, PCR: NEGATIVE
GC Probe Amp, Urine: NEGATIVE

## 2014-11-30 LAB — RPR

## 2014-11-30 LAB — HIV ANTIBODY (ROUTINE TESTING W REFLEX): HIV 1&2 Ab, 4th Generation: NONREACTIVE

## 2014-11-30 NOTE — Telephone Encounter (Signed)
fmla paperwork In [name] IN BOX To be reviewed and signed

## 2014-11-30 NOTE — Telephone Encounter (Signed)
Forms signed and in outbox to give to robin for review

## 2014-11-30 NOTE — Telephone Encounter (Signed)
FMLA forms to be filled out for low back pain, given to Patrick Hahn

## 2014-11-30 NOTE — Telephone Encounter (Signed)
Left message asking pt to call office  °

## 2014-12-01 NOTE — Telephone Encounter (Signed)
Left message for pt to call office.  Please let pt know his FMLA paper work is ready for him to pick up  Copy for patient Copy  For charge Copy for care  Copy for file

## 2014-12-01 NOTE — Telephone Encounter (Signed)
Pt aware paperwork ready for pick up 

## 2014-12-05 ENCOUNTER — Ambulatory Visit
Admit: 2014-12-05 | Disposition: A | Discharge status: Home / Self Care | Payer: Self-pay | Attending: Internal Medicine | Admitting: Internal Medicine

## 2014-12-06 ENCOUNTER — Encounter: Payer: Self-pay | Admitting: Internal Medicine

## 2014-12-15 ENCOUNTER — Other Ambulatory Visit: Payer: Self-pay | Admitting: Internal Medicine

## 2015-04-18 ENCOUNTER — Encounter: Payer: Self-pay | Admitting: Internal Medicine

## 2015-04-18 ENCOUNTER — Ambulatory Visit (INDEPENDENT_AMBULATORY_CARE_PROVIDER_SITE_OTHER): Payer: 59 | Admitting: Internal Medicine

## 2015-04-18 VITALS — BP 118/70 | HR 98 | Temp 98.5°F | Wt 207.0 lb

## 2015-04-18 DIAGNOSIS — M545 Low back pain, unspecified: Secondary | ICD-10-CM

## 2015-04-18 MED ORDER — METHOCARBAMOL 500 MG PO TABS: 500.0000 mg | ORAL_TABLET | Freq: Three times a day (TID) | ORAL | Status: DC

## 2015-04-18 MED ORDER — IBUPROFEN 600 MG PO TABS: 600.0000 mg | ORAL_TABLET | Freq: Three times a day (TID) | ORAL | Status: DC | PRN

## 2015-04-18 NOTE — Progress Notes (Signed)
Subjective:    Patient ID: Patrick Hahn, male    DOB: February 28, 1981, 34 y.o.   MRN: [id number]  HPI  Pt presents to the clinic today with c/o low back pain. This started 2-3 days ago. He describes the pain as tightness. The pain is worse with standing. The pain does not radiate. He denies numbness or tingling. He denies loss of bowel or bladder. He does have an old injury from 5-6 years ago that flares up every now and again. He has tried Tylenol without much relief. He has also been wearing a back brace and been stretching. He is concerned because he has to do a lot of heavy lifting for work.  Review of Systems  No past medical history on file.  No current outpatient prescriptions on file.   No current facility-administered medications for this visit.    No Known Allergies  Family History  Problem Relation Age of Onset  . Cancer Maternal Uncle     throat  . Diabetes Neg Hx   . Early death Neg Hx   . Heart disease Neg Hx   . Stroke Neg Hx     Social History   Social History  . Marital Status: Single    Spouse Name: N/A  . Number of Children: N/A  . Years of Education: N/A   Occupational History  . Not on file.   Social History Main Topics  . Smoking status: Never Smoker   . Smokeless tobacco: Never Used  . Alcohol Use: 0.6 oz/week    1 Shots of liquor per week     Comment: occasional  . Drug Use: No  . Sexual Activity: Yes   Other Topics Concern  . Not on file   Social History Narrative     Constitutional: Denies fever, malaise, fatigue, headache or abrupt weight changes.  Respiratory: Denies difficulty breathing, shortness of breath, cough or sputum production.   Cardiovascular: Denies chest pain, chest tightness, palpitations or swelling in the hands or feet.  Gastrointestinal: Denies abdominal pain, bloating, constipation, diarrhea or blood in the stool.  GU: Denies urgency, frequency, pain with urination, burning sensation, blood in urine, odor or  discharge. Musculoskeletal: Pt reports back pain. Denies difficulty with gait, or joint pain and swelling.   Neurological: Denies dizziness, difficulty with memory, difficulty with speech or problems with balance and coordination.    No other specific complaints in a complete review of systems (except as listed in HPI above).     Objective:   Physical Exam   BP 118/70 mmHg  Pulse 98  Temp(Src) 98.5 F (36.9 C) (Oral)  Wt 207 lb (93.895 kg)  SpO2 98% Wt Readings from Last 3 Encounters:  04/18/15 207 lb (93.895 kg)  11/29/14 199 lb (90.266 kg)  01/25/14 207 lb (93.895 kg)    General: Appears his stated age, well developed, well nourished in NAD. Cardiovascular: Normal rate and rhythm. S1,S2 noted.  No murmur, rubs or gallops noted.  Pulmonary/Chest: Normal effort and positive vesicular breath sounds. No respiratory distress. No wheezes, rales or ronchi noted.  Musculoskeletal: Normal flexion, extension and rotation of the spine. No pain with palpation of the lumbar spine. Pain with palpation of the right paralumbar muscles. Strength 5/5 BLE. No difficulty with gait.  Neurological: Alert and oriented. Sensation intact to BLE.   BMET    Component Value Date/Time   NA 136 11/29/2014 0953   K 3.9 11/29/2014 0953   CL 102 11/29/2014 0953   CO2  30 11/29/2014 0953   GLUCOSE 88 11/29/2014 0953   BUN 9 11/29/2014 0953   CREATININE 0.94 11/29/2014 0953   CALCIUM 9.1 11/29/2014 0953    Lipid Panel     Component Value Date/Time   CHOL 88 11/29/2014 0953   TRIG 28.0 11/29/2014 0953   HDL 39.50 11/29/2014 0953   CHOLHDL 2 11/29/2014 0953   VLDL 5.6 11/29/2014 0953   LDLCALC 43 11/29/2014 0953    CBC    Component Value Date/Time   WBC 4.6 11/29/2014 0953   RBC 4.18* 11/29/2014 0953   HGB 12.0* 11/29/2014 0953   HCT 35.5* 11/29/2014 0953   PLT 148.0* 11/29/2014 0953   MCV 84.8 11/29/2014 0953   MCHC 33.9 11/29/2014 0953   RDW 16.8* 11/29/2014 0953    Hgb A1C No  results found for: HGBA1C      Assessment & Plan:   Low back pain, right side, without sciatica:  Stop Tylenol Start Ibuprofen, RX given eRx for Robaxin 500 mg TID prn Work note to return Monday Handout given for stretching exercises Reassurance that this should resolve with time  RTC as needed or if symptoms persist or worsen

## 2015-04-18 NOTE — Progress Notes (Signed)
Pre visit review using our clinic review tool, if applicable. No additional management support is needed unless otherwise documented below in the visit note. 

## 2015-04-18 NOTE — Patient Instructions (Signed)
Back Exercises These exercises may help you when beginning to rehabilitate your injury. Your symptoms may resolve with or without further involvement from your physician, physical therapist or athletic trainer. While completing these exercises, remember:   Restoring tissue flexibility helps normal motion to return to the joints. This allows healthier, less painful movement and activity.  An effective stretch should be held for at least 30 seconds.  A stretch should never be painful. You should only feel a gentle lengthening or release in the stretched tissue. STRETCH - Extension, Prone on Elbows   Lie on your stomach on the floor, a bed will be too soft. Place your palms about shoulder width apart and at the height of your head.  Place your elbows under your shoulders. If this is too painful, stack pillows under your chest.  Allow your body to relax so that your hips drop lower and make contact more completely with the floor.  Hold this position for __________ seconds.  Slowly return to lying flat on the floor. Repeat __________ times. Complete this exercise __________ times per day.  RANGE OF MOTION - Extension, Prone Press Ups   Lie on your stomach on the floor, a bed will be too soft. Place your palms about shoulder width apart and at the height of your head.  Keeping your back as relaxed as possible, slowly straighten your elbows while keeping your hips on the floor. You may adjust the placement of your hands to maximize your comfort. As you gain motion, your hands will come more underneath your shoulders.  Hold this position __________ seconds.  Slowly return to lying flat on the floor. Repeat __________ times. Complete this exercise __________ times per day.  RANGE OF MOTION- Quadruped, Neutral Spine   Assume a hands and knees position on a firm surface. Keep your hands under your shoulders and your knees under your hips. You may place padding under your knees for  comfort.  Drop your head and point your tail bone toward the ground below you. This will round out your low back like an angry cat. Hold this position for __________ seconds.  Slowly lift your head and release your tail bone so that your back sags into a large arch, like an old horse.  Hold this position for __________ seconds.  Repeat this until you feel limber in your low back.  Now, find your "sweet spot." This will be the most comfortable position somewhere between the two previous positions. This is your neutral spine. Once you have found this position, tense your stomach muscles to support your low back.  Hold this position for __________ seconds. Repeat __________ times. Complete this exercise __________ times per day.  STRETCH - Flexion, Single Knee to Chest   Lie on a firm bed or floor with both legs extended in front of you.  Keeping one leg in contact with the floor, bring your opposite knee to your chest. Hold your leg in place by either grabbing behind your thigh or at your knee.  Pull until you feel a gentle stretch in your low back. Hold __________ seconds.  Slowly release your grasp and repeat the exercise with the opposite side. Repeat __________ times. Complete this exercise __________ times per day.  STRETCH - Hamstrings, Standing  Stand or sit and extend your right / left leg, placing your foot on a chair or foot stool  Keeping a slight arch in your low back and your hips straight forward.  Lead with your chest and   lean forward at the waist until you feel a gentle stretch in the back of your right / left knee or thigh. (When done correctly, this exercise requires leaning only a small distance.)  Hold this position for __________ seconds. Repeat __________ times. Complete this stretch __________ times per day. STRENGTHENING - Deep Abdominals, Pelvic Tilt   Lie on a firm bed or floor. Keeping your legs in front of you, bend your knees so they are both pointed  toward the ceiling and your feet are flat on the floor.  Tense your lower abdominal muscles to press your low back into the floor. This motion will rotate your pelvis so that your tail bone is scooping upwards rather than pointing at your feet or into the floor.  With a gentle tension and even breathing, hold this position for __________ seconds. Repeat __________ times. Complete this exercise __________ times per day.  STRENGTHENING - Abdominals, Crunches   Lie on a firm bed or floor. Keeping your legs in front of you, bend your knees so they are both pointed toward the ceiling and your feet are flat on the floor. Cross your arms over your chest.  Slightly tip your chin down without bending your neck.  Tense your abdominals and slowly lift your trunk high enough to just clear your shoulder blades. Lifting higher can put excessive stress on the low back and does not further strengthen your abdominal muscles.  Control your return to the starting position. Repeat __________ times. Complete this exercise __________ times per day.  STRENGTHENING - Quadruped, Opposite UE/LE Lift   Assume a hands and knees position on a firm surface. Keep your hands under your shoulders and your knees under your hips. You may place padding under your knees for comfort.  Find your neutral spine and gently tense your abdominal muscles so that you can maintain this position. Your shoulders and hips should form a rectangle that is parallel with the floor and is not twisted.  Keeping your trunk steady, lift your right hand no higher than your shoulder and then your left leg no higher than your hip. Make sure you are not holding your breath. Hold this position __________ seconds.  Continuing to keep your abdominal muscles tense and your back steady, slowly return to your starting position. Repeat with the opposite arm and leg. Repeat __________ times. Complete this exercise __________ times per day. Document Released:  08/16/2005 Document Revised: 10/21/2011 Document Reviewed: 11/10/2008 [employer] Patient Information 2015 [employer]. This information is not intended to replace advice given to you by your health care provider. Make sure you discuss any questions you have with your health care provider.  

## 2015-05-24 ENCOUNTER — Ambulatory Visit (INDEPENDENT_AMBULATORY_CARE_PROVIDER_SITE_OTHER): Payer: 59 | Admitting: Internal Medicine

## 2015-05-24 ENCOUNTER — Encounter: Payer: Self-pay | Admitting: Internal Medicine

## 2015-05-24 VITALS — BP 108/64 | HR 100 | Temp 98.0°F | Wt 204.0 lb

## 2015-05-24 DIAGNOSIS — H01003 Unspecified blepharitis right eye, unspecified eyelid: Secondary | ICD-10-CM | POA: Diagnosis not present

## 2015-05-24 NOTE — Progress Notes (Signed)
Subjective:    Patient ID: Patrick Hahn, male    DOB: 1981/04/18, 34 y.o.   MRN: [id number]  HPI  Pt presents to the clinic today with c/o swelling in his right eye. He noticed this yesterday. It is painful. He denies blurred vision or eye drainage. He has not had any recent illness. He has not taken anything OTC. He has not had sick contacts that he is aware of.  Review of Systems      No past medical history on file.  Current Outpatient Prescriptions  Medication Sig Dispense Refill  . ibuprofen (ADVIL,MOTRIN) 600 MG tablet Take 1 tablet (600 mg total) by mouth every 8 (eight) hours as needed. 30 tablet 0  . methocarbamol (ROBAXIN) 500 MG tablet Take 1 tablet (500 mg total) by mouth 3 (three) times daily. 30 tablet 0   No current facility-administered medications for this visit.    No Known Allergies  Family History  Problem Relation Age of Onset  . Cancer Maternal Uncle     throat  . Diabetes Neg Hx   . Early death Neg Hx   . Heart disease Neg Hx   . Stroke Neg Hx     Social History   Social History  . Marital Status: Single    Spouse Name: N/A  . Number of Children: N/A  . Years of Education: N/A   Occupational History  . Not on file.   Social History Main Topics  . Smoking status: Never Smoker   . Smokeless tobacco: Never Used  . Alcohol Use: 0.6 oz/week    1 Shots of liquor per week     Comment: occasional  . Drug Use: No  . Sexual Activity: Yes   Other Topics Concern  . Not on file   Social History Narrative     Constitutional: Denies fever, malaise, fatigue, headache or abrupt weight changes.  HEENT: Pt reports eye swelling. Denies eye pain, eye redness, ear pain, ringing in the ears, wax buildup, runny nose, nasal congestion, bloody nose, or sore throat. Respiratory: Denies difficulty breathing, shortness of breath, cough or sputum production.   Cardiovascular: Denies chest pain, chest tightness, palpitations or swelling in the hands or feet.    No other specific complaints in a complete review of systems (except as listed in HPI above).  Objective:   Physical Exam  BP 108/64 mmHg  Pulse 100  Temp(Src) 98 F (36.7 C) (Oral)  Wt 204 lb (92.534 kg)  SpO2 98% Wt Readings from Last 3 Encounters:  05/24/15 204 lb (92.534 kg)  04/18/15 207 lb (93.895 kg)  11/29/14 199 lb (90.266 kg)    General: Appears his stated age, well developed, well nourished in NAD. Skin: Warm, dry and intact. No rashes, lesions or ulcerations noted. HEENT: Head: normal shape and size; Eyes: sclera white, no icterus, conjunctiva pink, PERRLA and EOMs intact; right lower lid swelling noted. Ears: Tm's gray and intact, normal light reflex;  Neck:  No adenopathy noted. Cardiovascular: Normal rate and rhythm. S1,S2 noted.   Pulmonary/Chest: Normal effort and positive vesicular breath sounds. No respiratory distress. No wheezes, rales or ronchi noted.    BMET    Component Value Date/Time   NA 136 11/29/2014 0953   K 3.9 11/29/2014 0953   CL 102 11/29/2014 0953   CO2 30 11/29/2014 0953   GLUCOSE 88 11/29/2014 0953   BUN 9 11/29/2014 0953   CREATININE 0.94 11/29/2014 0953   CALCIUM 9.1 11/29/2014 0953  Lipid Panel     Component Value Date/Time   CHOL 88 11/29/2014 0953   TRIG 28.0 11/29/2014 0953   HDL 39.50 11/29/2014 0953   CHOLHDL 2 11/29/2014 0953   VLDL 5.6 11/29/2014 0953   LDLCALC 43 11/29/2014 0953    CBC    Component Value Date/Time   WBC 4.6 11/29/2014 0953   RBC 4.18* 11/29/2014 0953   HGB 12.0* 11/29/2014 0953   HCT 35.5* 11/29/2014 0953   PLT 148.0* 11/29/2014 0953   MCV 84.8 11/29/2014 0953   MCHC 33.9 11/29/2014 0953   RDW 16.8* 11/29/2014 0953    Hgb A1C No results found for: HGBA1C       Assessment & Plan:   Blepharitis of right lower lid:  Reassurance given that this is self limiting and should resolve on its own Warm compresses TID Ibuprofen as needed for pain and swelling Return precautions  given  RTC as needed or if symptoms persist or worsen

## 2015-05-24 NOTE — Patient Instructions (Signed)
Blepharitis Blepharitis is inflammation of the eyelids. Blepharitis may happen with:  Reddish, scaly skin around the scalp and eyebrows.  Burning or itching of the eyelids.  Eye discharge at night that causes the eyelashes to stick together in the morning.  Eyelashes that fall out.  Sensitivity to light. HOME CARE INSTRUCTIONS Pay attention to any changes in how you look or feel. Follow these instructions to help with your condition: Keeping Clean  Wash your hands often.  Wash your eyelids with warm water or with warm water that is mixed with a small amount of baby shampoo. Do this two times per day or as often as needed.  Wash your face and eyebrows at least once a day.  Use a clean towel each time you dry your eyelids. Do not use this towel to clean or dry other areas of your body. Do not share your towel with anyone. General Instructions  Avoid wearing makeup until you get better. Do not share makeup with anyone.  Avoid rubbing your eyes.  Apply warm compresses to your eyes 2 times per day for 10 minutes at a time, or as told by your health care provider.  If you were prescribed an antibiotic ointment or steroid drops, apply or use the medicine as told by your health care provider. Do not stop using the medicine even if you feel better.  Keep all follow-up visits as told by your health care provider. This is important. SEEK MEDICAL CARE IF:  Your eyelids feel hot.  You have blisters or a rash on your eyelids.  The condition does not go away in 2-4 days.  The inflammation gets worse. SEEK IMMEDIATE MEDICAL CARE IF:  You have pain or redness that gets worse or spreads to other parts of your face.  Your vision changes.  You have pain when looking at lights or moving objects.  You have a fever.   This information is not intended to replace advice given to you by your health care provider. Make sure you discuss any questions you have with your health care  provider.   Document Released: 07/26/2000 Document Revised: 04/19/2015 Document Reviewed: 11/21/2014 Elsevier Interactive Patient Education 2016 [employer]  

## 2015-05-24 NOTE — Progress Notes (Signed)
Pre visit review using our clinic review tool, if applicable. No additional management support is needed unless otherwise documented below in the visit note. 

## 2015-07-27 ENCOUNTER — Ambulatory Visit (INDEPENDENT_AMBULATORY_CARE_PROVIDER_SITE_OTHER): Payer: 59 | Admitting: Internal Medicine

## 2015-07-27 ENCOUNTER — Encounter: Payer: Self-pay | Admitting: Internal Medicine

## 2015-07-27 VITALS — BP 120/70 | HR 86 | Temp 97.9°F | Wt 203.0 lb

## 2015-07-27 DIAGNOSIS — M545 Low back pain, unspecified: Secondary | ICD-10-CM

## 2015-07-27 MED ORDER — METHOCARBAMOL 500 MG PO TABS: 500.0000 mg | ORAL_TABLET | Freq: Three times a day (TID) | ORAL | Status: DC

## 2015-07-27 NOTE — Progress Notes (Signed)
Subjective:    Patient ID: Patrick Hahn, male    DOB: 1981-03-26, 34 y.o.   MRN: [id number]  HPI  Pt presents to the clinic today with c/o low back pain. This started 2-3 days ago. He describes the pain as tightness. The pain is worse with standing. The pain does not radiate. He denies numbness or tingling. He denies loss of bowel or bladder. He does have an old injury from 5-6 years ago that flares up every now and again. He has tried Tylenol and Robaxin without good relief. He is concerned because he has to do a lot of heavy lifting for work.  Review of Systems  History reviewed. No pertinent past medical history.  Current Outpatient Prescriptions  Medication Sig Dispense Refill  . ibuprofen (ADVIL,MOTRIN) 600 MG tablet Take 1 tablet (600 mg total) by mouth every 8 (eight) hours as needed. 30 tablet 0  . methocarbamol (ROBAXIN) 500 MG tablet Take 1 tablet (500 mg total) by mouth 3 (three) times daily. 30 tablet 0   No current facility-administered medications for this visit.    No Known Allergies  Family History  Problem Relation Age of Onset  . Cancer Maternal Uncle     throat  . Diabetes Neg Hx   . Early death Neg Hx   . Heart disease Neg Hx   . Stroke Neg Hx     Social History   Social History  . Marital Status: Single    Spouse Name: N/A  . Number of Children: N/A  . Years of Education: N/A   Occupational History  . Not on file.   Social History Main Topics  . Smoking status: Never Smoker   . Smokeless tobacco: Never Used  . Alcohol Use: 0.6 oz/week    1 Shots of liquor per week     Comment: occasional  . Drug Use: No  . Sexual Activity: Yes   Other Topics Concern  . Not on file   Social History Narrative     Constitutional: Denies fever, malaise, fatigue, headache or abrupt weight changes.  Respiratory: Denies difficulty breathing, shortness of breath, cough or sputum production.   Cardiovascular: Denies chest pain, chest tightness, palpitations  or swelling in the hands or feet.  Gastrointestinal: Denies abdominal pain, bloating, constipation, diarrhea or blood in the stool.  GU: Denies urgency, frequency, pain with urination, burning sensation, blood in urine, odor or discharge. Musculoskeletal: Pt reports back pain. Denies difficulty with gait, or joint pain and swelling.   Neurological: Denies dizziness, difficulty with memory, difficulty with speech or problems with balance and coordination.    No other specific complaints in a complete review of systems (except as listed in HPI above).     Objective:   Physical Exam   BP 120/70 mmHg  Pulse 86  Temp(Src) 97.9 F (36.6 C) (Oral)  Wt 203 lb (92.08 kg)  SpO2 98% Wt Readings from Last 3 Encounters:  07/27/15 203 lb (92.08 kg)  05/24/15 204 lb (92.534 kg)  04/18/15 207 lb (93.895 kg)    General: Appears his stated age, well developed, well nourished in NAD. Cardiovascular: Normal rate and rhythm. S1,S2 noted.  No murmur, rubs or gallops noted.  Pulmonary/Chest: Normal effort and positive vesicular breath sounds. No respiratory distress. No wheezes, rales or ronchi noted.  Musculoskeletal: Normal flexion, extension and rotation of the spine. No pain with palpation of the lumbar spine. Pain with palpation of the right paralumbar muscles. Strength 5/5 BLE. No difficulty with  gait.  Neurological: Alert and oriented. Sensation intact to BLE.   BMET    Component Value Date/Time   NA 136 11/29/2014 0953   K 3.9 11/29/2014 0953   CL 102 11/29/2014 0953   CO2 30 11/29/2014 0953   GLUCOSE 88 11/29/2014 0953   BUN 9 11/29/2014 0953   CREATININE 0.94 11/29/2014 0953   CALCIUM 9.1 11/29/2014 0953    Lipid Panel     Component Value Date/Time   CHOL 88 11/29/2014 0953   TRIG 28.0 11/29/2014 0953   HDL 39.50 11/29/2014 0953   CHOLHDL 2 11/29/2014 0953   VLDL 5.6 11/29/2014 0953   LDLCALC 43 11/29/2014 0953    CBC    Component Value Date/Time   WBC 4.6 11/29/2014  0953   RBC 4.18* 11/29/2014 0953   HGB 12.0* 11/29/2014 0953   HCT 35.5* 11/29/2014 0953   PLT 148.0* 11/29/2014 0953   MCV 84.8 11/29/2014 0953   MCHC 33.9 11/29/2014 0953   RDW 16.8* 11/29/2014 0953    Hgb A1C No results found for: HGBA1C      Assessment & Plan:   Low back pain, right side, without sciatica:  Stop Tylenol Start Ibuprofen and Robaxin Work note to return Monday Handout given for stretching exercises   RTC as needed or if symptoms persist or worsen

## 2015-07-27 NOTE — Patient Instructions (Signed)

## 2015-07-27 NOTE — Progress Notes (Signed)
Pre visit review using our clinic review tool, if applicable. No additional management support is needed unless otherwise documented below in the visit note. 

## 2015-08-30 ENCOUNTER — Encounter: Payer: Self-pay | Admitting: Primary Care

## 2015-08-30 ENCOUNTER — Ambulatory Visit (INDEPENDENT_AMBULATORY_CARE_PROVIDER_SITE_OTHER): Payer: 59 | Admitting: Primary Care

## 2015-08-30 VITALS — BP 116/76 | HR 106 | Temp 97.9°F | Ht 71.0 in | Wt 200.8 lb

## 2015-08-30 DIAGNOSIS — R05 Cough: Secondary | ICD-10-CM | POA: Diagnosis not present

## 2015-08-30 DIAGNOSIS — R059 Cough, unspecified: Secondary | ICD-10-CM

## 2015-08-30 MED ORDER — BENZONATATE 200 MG PO CAPS: 200.0000 mg | ORAL_CAPSULE | Freq: Three times a day (TID) | ORAL | Status: DC | PRN

## 2015-08-30 NOTE — Patient Instructions (Signed)
You may take Benzonatate capsules for cough. Take 1 capsule by mouth three times daily as needed for cough.  Chest Congestion: Continue Mucinex for congestion.  Nasal Congestion: Try using Flonase (fluticasone) nasal spray. Instill 2 sprays in each nostril once daily.   Increase consumption of fluids and rest.  Please call me Tuesday next week if no improvement or if symptoms become worse.   It was a pleasure meeting you!  Upper Respiratory Infection, Adult Most upper respiratory infections (URIs) are a viral infection of the air passages leading to the lungs. A URI affects the nose, throat, and upper air passages. The most common type of URI is nasopharyngitis and is typically referred to as "the common cold." URIs run their course and usually go away on their own. Most of the time, a URI does not require medical attention, but sometimes a bacterial infection in the upper airways can follow a viral infection. This is called a secondary infection. Sinus and middle ear infections are common types of secondary upper respiratory infections. Bacterial pneumonia can also complicate a URI. A URI can worsen asthma and chronic obstructive pulmonary disease (COPD). Sometimes, these complications can require emergency medical care and may be life threatening.  CAUSES Almost all URIs are caused by viruses. A virus is a type of germ and can spread from one person to another.  RISKS FACTORS You may be at risk for a URI if:   You smoke.   You have chronic heart or lung disease.  You have a weakened defense (immune) system.   You are very young or very old.   You have nasal allergies or asthma.  You work in crowded or poorly ventilated areas.  You work in health care facilities or schools. SIGNS AND SYMPTOMS  Symptoms typically develop 2-3 days after you come in contact with a cold virus. Most viral URIs last 7-10 days. However, viral URIs from the influenza virus (flu virus) can last 14-18  days and are typically more severe. Symptoms may include:   Runny or stuffy (congested) nose.   Sneezing.   Cough.   Sore throat.   Headache.   Fatigue.   Fever.   Loss of appetite.   Pain in your forehead, behind your eyes, and over your cheekbones (sinus pain).  Muscle aches.  DIAGNOSIS  Your health care provider may diagnose a URI by:  Physical exam.  Tests to check that your symptoms are not due to another condition such as:  Strep throat.  Sinusitis.  Pneumonia.  Asthma. TREATMENT  A URI goes away on its own with time. It cannot be cured with medicines, but medicines may be prescribed or recommended to relieve symptoms. Medicines may help:  Reduce your fever.  Reduce your cough.  Relieve nasal congestion. HOME CARE INSTRUCTIONS   Take medicines only as directed by your health care provider.   Gargle warm saltwater or take cough drops to comfort your throat as directed by your health care provider.  Use a warm mist humidifier or inhale steam from a shower to increase air moisture. This may make it easier to breathe.  Drink enough fluid to keep your urine clear or pale yellow.   Eat soups and other clear broths and maintain good nutrition.   Rest as needed.   Return to work when your temperature has returned to normal or as your health care provider advises. You may need to stay home longer to avoid infecting others. You can also use a face  mask and careful hand washing to prevent spread of the virus.  Increase the usage of your inhaler if you have asthma.   Do not use any tobacco products, including cigarettes, chewing tobacco, or electronic cigarettes. If you need help quitting, ask your health care provider. PREVENTION  The best way to protect yourself from getting a cold is to practice good hygiene.   Avoid oral or hand contact with people with cold symptoms.   Wash your hands often if contact occurs.  There is no clear  evidence that vitamin C, vitamin E, echinacea, or exercise reduces the chance of developing a cold. However, it is always recommended to get plenty of rest, exercise, and practice good nutrition.  SEEK MEDICAL CARE IF:   You are getting worse rather than better.   Your symptoms are not controlled by medicine.   You have chills.  You have worsening shortness of breath.  You have brown or red mucus.  You have yellow or brown nasal discharge.  You have pain in your face, especially when you bend forward.  You have a fever.  You have swollen neck glands.  You have pain while swallowing.  You have white areas in the back of your throat. SEEK IMMEDIATE MEDICAL CARE IF:   You have severe or persistent:  Headache.  Ear pain.  Sinus pain.  Chest pain.  You have chronic lung disease and any of the following:  Wheezing.  Prolonged cough.  Coughing up blood.  A change in your usual mucus.  You have a stiff neck.  You have changes in your:  Vision.  Hearing.  Thinking.  Mood. MAKE SURE YOU:   Understand these instructions.  Will watch your condition.  Will get help right away if you are not doing well or get worse.   This information is not intended to replace advice given to you by your health care provider. Make sure you discuss any questions you have with your health care provider.   Document Released: 01/22/2001 Document Revised: 12/13/2014 Document Reviewed: 11/03/2013 Elsevier Interactive Patient Education [employer]

## 2015-08-30 NOTE — Progress Notes (Signed)
   Subjective:    Patient ID: Patrick Hahn, male    DOB: Jan 28, 1981, 35 y.o.   MRN: [id number]  HPI  Patrick Hahn is a 35 year old male who presents today with a chief complaint of cough. He also reports fever on Monday, headache, nausea, chills. His symptoms have been present since Monday this week. He's taken Mucinex without relief. He denies fevers today, sick contacts.   Review of Systems  Constitutional: Positive for fever. Negative for chills and fatigue.  HENT: Positive for congestion. Negative for ear pain, sinus pressure and sore throat.   Respiratory: Positive for cough.   Gastrointestinal: Positive for nausea. Negative for vomiting.       No past medical history on file.  Social History   Social History  . Marital Status: Single    Spouse Name: N/A  . Number of Children: N/A  . Years of Education: N/A   Occupational History  . Not on file.   Social History Main Topics  . Smoking status: Never Smoker   . Smokeless tobacco: Never Used  . Alcohol Use: 0.6 oz/week    1 Shots of liquor per week     Comment: occasional  . Drug Use: No  . Sexual Activity: Yes   Other Topics Concern  . Not on file   Social History Narrative    No past surgical history on file.  Family History  Problem Relation Age of Onset  . Cancer Maternal Uncle     throat  . Diabetes Neg Hx   . Early death Neg Hx   . Heart disease Neg Hx   . Stroke Neg Hx     No Known Allergies  Current Outpatient Prescriptions on File Prior to Visit  Medication Sig Dispense Refill  . ibuprofen (ADVIL,MOTRIN) 600 MG tablet Take 1 tablet (600 mg total) by mouth every 8 (eight) hours as needed. (Patient not taking: Reported on 08/30/2015) 30 tablet 0  . methocarbamol (ROBAXIN) 500 MG tablet Take 1 tablet (500 mg total) by mouth 3 (three) times daily. (Patient not taking: Reported on 08/30/2015) 30 tablet 0   No current facility-administered medications on file prior to visit.    BP 116/76 mmHg  Pulse  106  Temp(Src) 97.9 F (36.6 C) (Oral)  Ht  (1.803 m)  Wt 200 lb 12.8 oz (91.082 kg)  BMI 28.02 kg/m2  SpO2 98%    Objective:   Physical Exam  Constitutional: He appears well-nourished.  HENT:  Right Ear: Tympanic membrane and ear canal normal.  Left Ear: Tympanic membrane and ear canal normal.  Nose: Nose normal. Right sinus exhibits no maxillary sinus tenderness and no frontal sinus tenderness. Left sinus exhibits no maxillary sinus tenderness and no frontal sinus tenderness.  Mouth/Throat: Oropharynx is clear and moist.  Eyes: Conjunctivae are normal.  Neck: Neck supple.  Cardiovascular: Normal rate and regular rhythm.   Pulmonary/Chest: Effort normal and breath sounds normal. He has no wheezes. He has no rales.  Lymphadenopathy:    He has no cervical adenopathy.  Skin: Skin is warm and dry.          Assessment & Plan:  URI:  Cough, nasal congestion, chills, low grade fever.  Symptoms began 2 days ago, no fevers today. Exam with clear lungs and normal HENT, unremarkable. Suspect viral URI at this point and will treat with supportive measures. Tessalon pearls, Mucinex, flonase, fluids, rest. Work note provided.  Return precautions provided.

## 2015-10-23 ENCOUNTER — Encounter: Payer: Self-pay | Admitting: Internal Medicine

## 2015-10-23 ENCOUNTER — Ambulatory Visit (INDEPENDENT_AMBULATORY_CARE_PROVIDER_SITE_OTHER)
Admission: RE | Admit: 2015-10-23 | Discharge: 2015-10-23 | Disposition: A | Discharge status: Home / Self Care | Payer: 59 | Source: Ambulatory Visit | Attending: Internal Medicine | Admitting: Internal Medicine

## 2015-10-23 ENCOUNTER — Ambulatory Visit (INDEPENDENT_AMBULATORY_CARE_PROVIDER_SITE_OTHER): Payer: 59 | Admitting: Internal Medicine

## 2015-10-23 VITALS — BP 116/68 | HR 76 | Temp 97.7°F | Wt 205.5 lb

## 2015-10-23 DIAGNOSIS — M545 Low back pain, unspecified: Secondary | ICD-10-CM

## 2015-10-23 MED ORDER — TRAMADOL HCL 50 MG PO TABS: 50.0000 mg | ORAL_TABLET | Freq: Three times a day (TID) | ORAL | Status: DC | PRN

## 2015-10-23 NOTE — Patient Instructions (Signed)

## 2015-10-23 NOTE — Progress Notes (Signed)
Pre visit review using our clinic review tool, if applicable. No additional management support is needed unless otherwise documented below in the visit note. 

## 2015-10-23 NOTE — Progress Notes (Signed)
Subjective:    Patient ID: Patrick Hahn, male    DOB: Apr 18, 1981, 35 y.o.   MRN: [id number]  HPI  Pt presents to the clinic today with c/o back pain. He reports this started 1 week ago. The pain is located in the mid lower back and radiates to the right side. He describes the pain as sharp. It is intermittent. The pain does not radiate into his leg. He denies numbness, tingling, loss of bowel or bladder. He was playing basketball when he noticed the pain. He has tried Ibuprofen, Robaxin, heat and stretching with minimal relief. He reports this pain is different from his usual back pain.  Review of Systems      History reviewed. No pertinent past medical history.  Current Outpatient Prescriptions  Medication Sig Dispense Refill  . ibuprofen (ADVIL,MOTRIN) 600 MG tablet Take 1 tablet (600 mg total) by mouth every 8 (eight) hours as needed. 30 tablet 0  . methocarbamol (ROBAXIN) 500 MG tablet Take 1 tablet (500 mg total) by mouth 3 (three) times daily. 30 tablet 0  . traMADol (ULTRAM) 50 MG tablet Take 1 tablet (50 mg total) by mouth every 8 (eight) hours as needed. 30 tablet 0   No current facility-administered medications for this visit.    No Known Allergies  Family History  Problem Relation Age of Onset  . Cancer Maternal Uncle     throat  . Diabetes Neg Hx   . Early death Neg Hx   . Heart disease Neg Hx   . Stroke Neg Hx     Social History   Social History  . Marital Status: Single    Spouse Name: N/A  . Number of Children: N/A  . Years of Education: N/A   Occupational History  . Not on file.   Social History Main Topics  . Smoking status: Never Smoker   . Smokeless tobacco: Never Used  . Alcohol Use: 0.6 oz/week    1 Shots of liquor per week     Comment: occasional  . Drug Use: No  . Sexual Activity: Yes   Other Topics Concern  . Not on file   Social History Narrative     Constitutional: Denies fever, malaise, fatigue, headache or abrupt weight  changes.  Respiratory: Denies difficulty breathing, shortness of breath, cough or sputum production.   Cardiovascular: Denies chest pain, chest tightness, palpitations or swelling in the hands or feet.  Gastrointestinal: Denies abdominal pain, bloating, constipation, diarrhea or blood in the stool.  GU: Denies urgency, frequency, pain with urination, burning sensation, blood in urine, odor or discharge. Musculoskeletal: Pt reports back pain. Denies decrease in range of motion, difficulty with gait, or joint swelling.  Skin: Denies redness, rashes, lesions or ulcercations.  Neurological: Denies dizziness, difficulty with memory, difficulty with speech or problems with balance and coordination.   No other specific complaints in a complete review of systems (except as listed in HPI above).  Objective:   Physical Exam  BP 116/68 mmHg  Pulse 76  Temp(Src) 97.7 F (36.5 C) (Oral)  Wt 205 lb 8 oz (93.214 kg)  SpO2 98% Wt Readings from Last 3 Encounters:  10/23/15 205 lb 8 oz (93.214 kg)  08/30/15 200 lb 12.8 oz (91.082 kg)  07/27/15 203 lb (92.08 kg)    General: Appears his stated age, well developed, well nourished in NAD. Cardiovascular: Normal rate and rhythm. S1,S2 noted.  No murmur, rubs or gallops noted.  Pulmonary/Chest: Normal effort and positive vesicular breath  sounds. No respiratory distress. No wheezes, rales or ronchi noted.  Musculoskeletal: Normal flexion, extension and rotation of the cervical spine. Bony tenderness noted over the lumbar spine. Strength 5/5 BLE. He is very stiff when walking, unable to straighten up completely. Neurological: Alert and oriented. Sensation intact to BLE.  BMET    Component Value Date/Time   NA 136 11/29/2014 0953   K 3.9 11/29/2014 0953   CL 102 11/29/2014 0953   CO2 30 11/29/2014 0953   GLUCOSE 88 11/29/2014 0953   BUN 9 11/29/2014 0953   CREATININE 0.94 11/29/2014 0953   CALCIUM 9.1 11/29/2014 0953    Lipid Panel     Component  Value Date/Time   CHOL 88 11/29/2014 0953   TRIG 28.0 11/29/2014 0953   HDL 39.50 11/29/2014 0953   CHOLHDL 2 11/29/2014 0953   VLDL 5.6 11/29/2014 0953   LDLCALC 43 11/29/2014 0953    CBC    Component Value Date/Time   WBC 4.6 11/29/2014 0953   RBC 4.18* 11/29/2014 0953   HGB 12.0* 11/29/2014 0953   HCT 35.5* 11/29/2014 0953   PLT 148.0* 11/29/2014 0953   MCV 84.8 11/29/2014 0953   MCHC 33.9 11/29/2014 0953   RDW 16.8* 11/29/2014 0953    Hgb A1C No results found for: HGBA1C       Assessment & Plan:   Low back pain:  Continue Ibuprofen, Robaxin, heat and stretching Will obtain xray of lumbar spine today RX for Tramadol for severe pain  Will follow up after xray, return precautions provided

## 2015-10-25 ENCOUNTER — Other Ambulatory Visit: Payer: Self-pay | Admitting: Internal Medicine

## 2015-10-25 DIAGNOSIS — M545 Low back pain, unspecified: Secondary | ICD-10-CM

## 2016-01-19 ENCOUNTER — Encounter: Payer: Self-pay | Admitting: Internal Medicine

## 2016-01-19 ENCOUNTER — Ambulatory Visit (INDEPENDENT_AMBULATORY_CARE_PROVIDER_SITE_OTHER): Payer: 59 | Admitting: Internal Medicine

## 2016-01-19 VITALS — BP 102/58 | HR 97 | Temp 98.3°F | Wt 206.0 lb

## 2016-01-19 DIAGNOSIS — B354 Tinea corporis: Secondary | ICD-10-CM | POA: Diagnosis not present

## 2016-01-19 MED ORDER — CICLOPIROX 0.77 % EX GEL: 1.0000 "application " | Freq: Two times a day (BID) | CUTANEOUS | Status: DC

## 2016-01-19 NOTE — Progress Notes (Signed)
Subjective:    Patient ID: Patrick Hahn, male    DOB: Aug 29, 1980, 35 y.o.   MRN: [id number]  HPI  Pt presents to the clinic today with c/o a rash on his arms. He noticed this months ago, but he reports it has gotten worse. It has not spread onto his body. It is itchy. He has tried Hydrocortisone cream without any relief. No one in the home has a similar rash. There are no pets in the home. He is not sure if there has been a change in soap, lotions or detergents.   Review of Systems      No past medical history on file.  Current Outpatient Prescriptions  Medication Sig Dispense Refill  . ibuprofen (ADVIL,MOTRIN) 600 MG tablet Take 1 tablet (600 mg total) by mouth every 8 (eight) hours as needed. 30 tablet 0  . methocarbamol (ROBAXIN) 500 MG tablet Take 1 tablet (500 mg total) by mouth 3 (three) times daily. 30 tablet 0  . traMADol (ULTRAM) 50 MG tablet Take 1 tablet (50 mg total) by mouth every 8 (eight) hours as needed. 30 tablet 0   No current facility-administered medications for this visit.    No Known Allergies  Family History  Problem Relation Age of Onset  . Cancer Maternal Uncle     throat  . Diabetes Neg Hx   . Early death Neg Hx   . Heart disease Neg Hx   . Stroke Neg Hx     Social History   Social History  . Marital Status: Single    Spouse Name: N/A  . Number of Children: N/A  . Years of Education: N/A   Occupational History  . Not on file.   Social History Main Topics  . Smoking status: Never Smoker   . Smokeless tobacco: Never Used  . Alcohol Use: 0.6 oz/week    1 Shots of liquor per week     Comment: occasional  . Drug Use: No  . Sexual Activity: Yes   Other Topics Concern  . Not on file   Social History Narrative     Constitutional: Denies fever, malaise, fatigue, headache or abrupt weight changes.  Skin: Pt reports rash. Denies lesions or ulcercations.    No other specific complaints in a complete review of systems (except as listed  in HPI above).  Objective:   Physical Exam  BP 102/58 mmHg  Pulse 97  Temp(Src) 98.3 F (36.8 C) (Oral)  Wt 206 lb (93.441 kg)  SpO2 97% Wt Readings from Last 3 Encounters:  01/19/16 206 lb (93.441 kg)  10/23/15 205 lb 8 oz (93.214 kg)  08/30/15 200 lb 12.8 oz (91.082 kg)    General: Appears his stated age, well developed, well nourished in NAD. Skin: 3 distinct round, scaly lesions, with central clearing noted on bilateral forearms.  BMET    Component Value Date/Time   NA 136 11/29/2014 0953   K 3.9 11/29/2014 0953   CL 102 11/29/2014 0953   CO2 30 11/29/2014 0953   GLUCOSE 88 11/29/2014 0953   BUN 9 11/29/2014 0953   CREATININE 0.94 11/29/2014 0953   CALCIUM 9.1 11/29/2014 0953    Lipid Panel     Component Value Date/Time   CHOL 88 11/29/2014 0953   TRIG 28.0 11/29/2014 0953   HDL 39.50 11/29/2014 0953   CHOLHDL 2 11/29/2014 0953   VLDL 5.6 11/29/2014 0953   LDLCALC 43 11/29/2014 0953    CBC    Component Value Date/Time  WBC 4.6 11/29/2014 0953   RBC 4.18* 11/29/2014 0953   HGB 12.0* 11/29/2014 0953   HCT 35.5* 11/29/2014 0953   PLT 148.0* 11/29/2014 0953   MCV 84.8 11/29/2014 0953   MCHC 33.9 11/29/2014 0953   RDW 16.8* 11/29/2014 0953    Hgb A1C No results found for: HGBA1C       Assessment & Plan:   Tinea Corporis:  eRx for Ciclopirox cream BID x 2-4 weeks  Make an appt for your annual exam

## 2016-01-19 NOTE — Patient Instructions (Signed)

## 2016-01-19 NOTE — Progress Notes (Signed)
Pre visit review using our clinic review tool, if applicable. No additional management support is needed unless otherwise documented below in the visit note. 

## 2016-01-24 ENCOUNTER — Telehealth: Payer: Self-pay | Admitting: Internal Medicine

## 2016-01-24 DIAGNOSIS — Z7689 Persons encountering health services in other specified circumstances: Secondary | ICD-10-CM

## 2016-01-24 NOTE — Telephone Encounter (Signed)
Done, given to Robin 

## 2016-01-24 NOTE — Telephone Encounter (Signed)
Pt dropped off [facility] paperwork Same as last year In [facility] in box for review and signature

## 2016-01-24 NOTE — Telephone Encounter (Signed)
Pt aware paperwork ready for pick up Pt aware he will need to turn in paperwork himself  Copy for pt Copy for scan Copy for file Copy for billing

## 2016-02-12 ENCOUNTER — Ambulatory Visit (INDEPENDENT_AMBULATORY_CARE_PROVIDER_SITE_OTHER): Payer: 59 | Admitting: Family Medicine

## 2016-02-12 ENCOUNTER — Encounter: Payer: Self-pay | Admitting: Family Medicine

## 2016-02-12 VITALS — BP 102/60 | HR 84 | Temp 98.3°F | Wt 202.2 lb

## 2016-02-12 DIAGNOSIS — M545 Low back pain, unspecified: Secondary | ICD-10-CM

## 2016-02-12 NOTE — Patient Instructions (Addendum)
You should get better with the medicine, heat, stretching and rest.   Take care.  Glad to see you.

## 2016-02-12 NOTE — Progress Notes (Signed)
Pre visit review using our clinic review tool, if applicable. No additional management support is needed unless otherwise documented below in the visit note.  Back pain.  Had been playing basketball, then had pain afterward.  Tried his baseline meds, with stretching and heat.  Out of work 6/28 and 6/29, 7/2 and 7/3.  No specific trauma.    L lower back pain.  Not midline pain.  No radiation into the legs.  No other trauma.  H/o similar back pain flares in the past.  No B/B sx.  No leg weakness.    832 month old son at home.  I offered congrats.    Meds, vitals, and allergies reviewed.   ROS: Per HPI unless specifically indicated in ROS section   nad ncat Neck supple rrr ctab Abd soft, back not ttp in midlinle but L lower back ttp w/o bruising or rash.  No SI pain on testing.  SLR neg B.  S/s grossly wnl BLE

## 2016-02-14 DIAGNOSIS — M549 Dorsalgia, unspecified: Secondary | ICD-10-CM | POA: Insufficient documentation

## 2016-02-14 NOTE — Assessment & Plan Note (Signed)
Some better.  Work note given.  Likely benign strain.  Should resolve with baseline meds, heat, stretching and rest.  He agrees.

## 2016-02-23 ENCOUNTER — Ambulatory Visit (INDEPENDENT_AMBULATORY_CARE_PROVIDER_SITE_OTHER): Payer: 59 | Admitting: Internal Medicine

## 2016-02-23 ENCOUNTER — Encounter: Payer: Self-pay | Admitting: Internal Medicine

## 2016-02-23 VITALS — BP 106/70 | HR 82 | Temp 98.2°F | Ht 71.0 in | Wt 204.0 lb

## 2016-02-23 DIAGNOSIS — Z113 Encounter for screening for infections with a predominantly sexual mode of transmission: Secondary | ICD-10-CM

## 2016-02-23 DIAGNOSIS — Z0000 Encounter for general adult medical examination without abnormal findings: Secondary | ICD-10-CM

## 2016-02-23 DIAGNOSIS — Z23 Encounter for immunization: Secondary | ICD-10-CM | POA: Diagnosis not present

## 2016-02-23 LAB — CBC
HEMATOCRIT: 36 % — AB (ref 39.0–52.0)
HEMOGLOBIN: 11.9 g/dL — AB (ref 13.0–17.0)
MCHC: 33.1 g/dL (ref 30.0–36.0)
MCV: 85.4 fl (ref 78.0–100.0)
PLATELETS: 159 10*3/uL (ref 150.0–400.0)
RBC: 4.22 Mil/uL (ref 4.22–5.81)
RDW: 15.9 % — ABNORMAL HIGH (ref 11.5–15.5)
WBC: 5.3 10*3/uL (ref 4.0–10.5)

## 2016-02-23 LAB — COMPREHENSIVE METABOLIC PANEL
ALBUMIN: 4.2 g/dL (ref 3.5–5.2)
ALT: 17 U/L (ref 0–53)
AST: 19 U/L (ref 0–37)
Alkaline Phosphatase: 57 U/L (ref 39–117)
BUN: 13 mg/dL (ref 6–23)
CALCIUM: 9 mg/dL (ref 8.4–10.5)
CHLORIDE: 105 meq/L (ref 96–112)
CO2: 27 meq/L (ref 19–32)
CREATININE: 0.99 mg/dL (ref 0.40–1.50)
GFR: 111.26 mL/min (ref >60.00)
Glucose, Bld: 89 mg/dL (ref 70–99)
POTASSIUM: 4.1 meq/L (ref 3.5–5.1)
Sodium: 139 mEq/L (ref 135–145)
Total Bilirubin: 1.5 mg/dL — ABNORMAL HIGH (ref 0.2–1.2)
Total Protein: 7.6 g/dL (ref 6.0–8.3)

## 2016-02-23 LAB — LIPID PANEL
CHOL/HDL RATIO: 2
CHOLESTEROL: 93 mg/dL (ref 0–200)
HDL: 39.5 mg/dL (ref >39.00)
LDL CALC: 44 mg/dL (ref 0–99)
NonHDL: 53
TRIGLYCERIDES: 45 mg/dL (ref 0.0–149.0)
VLDL: 9 mg/dL (ref 0.0–40.0)

## 2016-02-23 NOTE — Addendum Note (Signed)
Addended by: Roena MaladyEVONTENNO, Tegan Burnside Y on: 02/23/2016 04:41 PM   Modules accepted: Orders, SmartSet

## 2016-02-23 NOTE — Progress Notes (Signed)
Subjective:    Patient ID: Patrick Hahn, male    DOB: 1981/07/17, 35 y.o.   MRN: [id number]  HPI  Pt presents to the clinic today for his annual exam.  Flu: never Tetanus: ?  2008 Dentist: annually  Diet:  He eats mostly fast food.  He does eat meat, some fruits and veggies. He does eat some fried food. He drinks water throughout the day. Exercise: Basketball once every other week   Review of Systems      No past medical history on file.  Current Outpatient Prescriptions  Medication Sig Dispense Refill  . Ciclopirox 0.77 % gel Apply 1 application topically 2 (two) times daily. 45 g 0  . ibuprofen (ADVIL,MOTRIN) 600 MG tablet Take 1 tablet (600 mg total) by mouth every 8 (eight) hours as needed. 30 tablet 0  . methocarbamol (ROBAXIN) 500 MG tablet Take 1 tablet (500 mg total) by mouth 3 (three) times daily. 30 tablet 0  . traMADol (ULTRAM) 50 MG tablet Take 1 tablet (50 mg total) by mouth every 8 (eight) hours as needed. 30 tablet 0   No current facility-administered medications for this visit.    No Known Allergies  Family History  Problem Relation Age of Onset  . Cancer Maternal Uncle     throat  . Diabetes Neg Hx   . Early death Neg Hx   . Heart disease Neg Hx   . Stroke Neg Hx     Social History   Social History  . Marital Status: Single    Spouse Name: N/A  . Number of Children: N/A  . Years of Education: N/A   Occupational History  . Not on file.   Social History Main Topics  . Smoking status: Never Smoker   . Smokeless tobacco: Never Used  . Alcohol Use: 0.6 oz/week    1 Shots of liquor per week     Comment: occasional  . Drug Use: No  . Sexual Activity: Yes   Other Topics Concern  . Not on file   Social History Narrative     Constitutional: Denies fever, malaise, fatigue, headache or abrupt weight changes.  HEENT: Denies eye pain, eye redness, ear pain, ringing in the ears, wax buildup, runny nose, nasal congestion, bloody nose, or sore  throat. Respiratory: Denies difficulty breathing, shortness of breath, cough or sputum production.   Cardiovascular: Denies chest pain, chest tightness, palpitations or swelling in the hands or feet.  Gastrointestinal: Denies abdominal pain, bloating, constipation, diarrhea or blood in the stool.  GU: Denies urgency, frequency, pain with urination, burning sensation, blood in urine, odor or discharge. Musculoskeletal: Denies decrease in range of motion, difficulty with gait, muscle pain or joint pain and swelling.  Skin: Pt reports skin lesion of right hand. Denies redness, rashes, or ulcercations.  Neurological: Denies dizziness, difficulty with memory, difficulty with speech or problems with balance and coordination.  Psych: Denies anxiety, depression, SI/HI.  No other specific complaints in a complete review of systems (except as listed in HPI above).  Objective:   Physical Exam  BP 106/70 mmHg  Pulse 82  Temp(Src) 98.2 F (36.8 C) (Oral)  Ht  (1.803 m)  Wt 204 lb (92.534 kg)  BMI 28.46 kg/m2  SpO2 98% Wt Readings from Last 3 Encounters:  02/23/16 204 lb (92.534 kg)  02/12/16 202 lb 4 oz (91.74 kg)  01/19/16 206 lb (93.441 kg)    General: Appears his stated age, well developed, well nourished in NAD.  Skin: Warm, dry and intact. Small callous at base of right middle finger. HEENT: Head: normal shape and size; Eyes: sclera white, no icterus, conjunctiva pink, PERRLA and EOMs intact; Ears: Tm's gray and intact, normal light reflex; Throat/Mouth: Teeth present, mucosa pink and moist, no exudate, lesions or ulcerations noted.  Neck:  Neck supple, trachea midline. No masses, lumps or thyromegaly present.  Cardiovascular: Normal rate and rhythm. S1,S2 noted.  No murmur, rubs or gallops noted. No JVD or BLE edema. Pulmonary/Chest: Normal effort and positive vesicular breath sounds. No respiratory distress. No wheezes, rales or ronchi noted.  Abdomen: Soft and nontender. Normal  bowel sounds. No distention or masses noted. Liver, spleen and kidneys non palpable. Musculoskeletal: Normal range of motion. Strength 5/5 BUE/BLE. No difficulty with gait.  Neurological: Alert and oriented. Cranial nerves II-XII grossly intact. Coordination normal.  Psychiatric: Mood and affect normal. Behavior is normal. Judgment and thought content normal.    BMET    Component Value Date/Time   NA 136 11/29/2014 0953   K 3.9 11/29/2014 0953   CL 102 11/29/2014 0953   CO2 30 11/29/2014 0953   GLUCOSE 88 11/29/2014 0953   BUN 9 11/29/2014 0953   CREATININE 0.94 11/29/2014 0953   CALCIUM 9.1 11/29/2014 0953    Lipid Panel     Component Value Date/Time   CHOL 88 11/29/2014 0953   TRIG 28.0 11/29/2014 0953   HDL 39.50 11/29/2014 0953   CHOLHDL 2 11/29/2014 0953   VLDL 5.6 11/29/2014 0953   LDLCALC 43 11/29/2014 0953    CBC    Component Value Date/Time   WBC 4.6 11/29/2014 0953   RBC 4.18* 11/29/2014 0953   HGB 12.0* 11/29/2014 0953   HCT 35.5* 11/29/2014 0953   PLT 148.0* 11/29/2014 0953   MCV 84.8 11/29/2014 0953   MCHC 33.9 11/29/2014 0953   RDW 16.8* 11/29/2014 0953    Hgb A1C No results found for: HGBA1C       Assessment & Plan:   Preventative Health Maintenance:  Encouraged him to get a flu shot in the fall Tetanus booster today Encouraged him to see a dentist annually Encouraged him to consume a balanced diet and exercise regimen Will check CBC, CMET, Lipid, HIV, RPR, HSV today Will check urine GC today  RTC in 1 year, sooner if needed Nicki ReaperBAITY, Matylda Fehring, NP

## 2016-02-23 NOTE — Progress Notes (Signed)
Pre visit review using our clinic review tool, if applicable. No additional management support is needed unless otherwise documented below in the visit note. 

## 2016-02-23 NOTE — Patient Instructions (Signed)

## 2016-02-24 LAB — HIV ANTIBODY (ROUTINE TESTING W REFLEX): HIV: NONREACTIVE

## 2016-02-24 LAB — RPR

## 2016-02-26 LAB — HSV(HERPES SMPLX)ABS-I+II(IGG+IGM)-BLD
HSV 1 Glycoprotein G Ab, IgG: <0.9 Index (ref <0.90)
HSV 2 Glycoprotein G Ab, IgG: <0.9 Index (ref <0.90)
Herpes Simplex Vrs I&II-IgM Ab (EIA): 0.72 INDEX

## 2016-02-26 LAB — GC/CHLAMYDIA PROBE AMP
CT Probe RNA: NOT DETECTED
GC Probe RNA: NOT DETECTED

## 2016-06-07 ENCOUNTER — Encounter: Payer: Self-pay | Admitting: Internal Medicine

## 2016-06-07 ENCOUNTER — Ambulatory Visit (INDEPENDENT_AMBULATORY_CARE_PROVIDER_SITE_OTHER): Payer: 59 | Admitting: Internal Medicine

## 2016-06-07 VITALS — BP 110/74 | HR 76 | Temp 98.2°F | Wt 213.0 lb

## 2016-06-07 DIAGNOSIS — M25561 Pain in right knee: Secondary | ICD-10-CM

## 2016-06-07 NOTE — Patient Instructions (Signed)
Generic Knee Exercises EXERCISES RANGE OF MOTION (ROM) AND STRETCHING EXERCISES These exercises may help you when beginning to rehabilitate your injury. Your symptoms may resolve with or without further involvement from your physician, physical therapist, or athletic trainer. While completing these exercises, remember:   Restoring tissue flexibility helps normal motion to return to the joints. This allows healthier, less painful movement and activity.  An effective stretch should be held for at least 30 seconds.  A stretch should never be painful. You should only feel a gentle lengthening or release in the stretched tissue. STRETCH - Knee Extension, Prone  Lie on your stomach on a firm surface, such as a bed or countertop. Place your right / left knee and leg just beyond the edge of the surface. You may wish to place a towel under the far end of your right / left thigh for comfort.  Relax your leg muscles and allow gravity to straighten your knee. Your clinician may advise you to add an ankle weight if more resistance is helpful for you.  You should feel a stretch in the back of your right / left knee. Hold this position for __________ seconds. Repeat __________ times. Complete this stretch __________ times per day. * Your physician, physical therapist, or athletic trainer may ask you to add ankle weight to enhance your stretch.  RANGE OF MOTION - Knee Flexion, Active  Lie on your back with both knees straight. (If this causes back discomfort, bend your opposite knee, placing your foot flat on the floor.)  Slowly slide your heel back toward your buttocks until you feel a gentle stretch in the front of your knee or thigh.  Hold for __________ seconds. Slowly slide your heel back to the starting position. Repeat __________ times. Complete this exercise __________ times per day.  STRETCH - Quadriceps, Prone   Lie on your stomach on a firm surface, such as a bed or padded floor.  Bend your  right / left knee and grasp your ankle. If you are unable to reach your ankle or pant leg, use a belt around your foot to lengthen your reach.  Gently pull your heel toward your buttocks. Your knee should not slide out to the side. You should feel a stretch in the front of your thigh and/or knee.  Hold this position for __________ seconds. Repeat __________ times. Complete this stretch __________ times per day.  STRETCH - Hamstrings, Supine   Lie on your back. Loop a belt or towel over the ball of your right / left foot.  Straighten your right / left knee and slowly pull on the belt to raise your leg. Do not allow the right / left knee to bend. Keep your opposite leg flat on the floor.  Raise the leg until you feel a gentle stretch behind your right / left knee or thigh. Hold this position for __________ seconds. Repeat __________ times. Complete this stretch __________ times per day.  STRENGTHENING EXERCISES These exercises may help you when beginning to rehabilitate your injury. They may resolve your symptoms with or without further involvement from your physician, physical therapist, or athletic trainer. While completing these exercises, remember:   Muscles can gain both the endurance and the strength needed for everyday activities through controlled exercises.  Complete these exercises as instructed by your physician, physical therapist, or athletic trainer. Progress the resistance and repetitions only as guided.  You may experience muscle soreness or fatigue, but the pain or discomfort you are trying to   eliminate should never worsen during these exercises. If this pain does worsen, stop and make certain you are following the directions exactly. If the pain is still present after adjustments, discontinue the exercise until you can discuss the trouble with your clinician. STRENGTH - Quadriceps, Isometrics  Lie on your back with your right / left leg extended and your opposite knee  bent.  Gradually tense the muscles in the front of your right / left thigh. You should see either your knee cap slide up toward your hip or increased dimpling just above the knee. This motion will push the back of the knee down toward the floor/mat/bed on which you are lying.  Hold the muscle as tight as you can without increasing your pain for __________ seconds.  Relax the muscles slowly and completely in between each repetition. Repeat __________ times. Complete this exercise __________ times per day.  STRENGTH - Quadriceps, Short Arcs   Lie on your back. Place a __________ inch towel roll under your knee so that the knee slightly bends.  Raise only your lower leg by tightening the muscles in the front of your thigh. Do not allow your thigh to rise.  Hold this position for __________ seconds. Repeat __________ times. Complete this exercise __________ times per day.  OPTIONAL ANKLE WEIGHTS: Begin with ____________________, but DO NOT exceed ____________________. Increase in 1 pound/0.5 kilogram increments.  STRENGTH - Quadriceps, Straight Leg Raises  Quality counts! Watch for signs that the quadriceps muscle is working to insure you are strengthening the correct muscles and not "cheating" by substituting with healthier muscles.  Lay on your back with your right / left leg extended and your opposite knee bent.  Tense the muscles in the front of your right / left thigh. You should see either your knee cap slide up or increased dimpling just above the knee. Your thigh may even quiver.  Tighten these muscles even more and raise your leg 4 to 6 inches off the floor. Hold for __________ seconds.  Keeping these muscles tense, lower your leg.  Relax the muscles slowly and completely in between each repetition. Repeat __________ times. Complete this exercise __________ times per day.  STRENGTH - Hamstring, Curls  Lay on your stomach with your legs extended. (If you lay on a bed, your feet  may hang over the edge.)  Tighten the muscles in the back of your thigh to bend your right / left knee up to 90 degrees. Keep your hips flat on the bed/floor.  Hold this position for __________ seconds.  Slowly lower your leg back to the starting position. Repeat __________ times. Complete this exercise __________ times per day.  OPTIONAL ANKLE WEIGHTS: Begin with ____________________, but DO NOT exceed ____________________. Increase in 1 pound/0.5 kilogram increments.  STRENGTH - Quadriceps, Squats  Stand in a door frame so that your feet and knees are in line with the frame.  Use your hands for balance, not support, on the frame.  Slowly lower your weight, bending at the hips and knees. Keep your lower legs upright so that they are parallel with the door frame. Squat only within the range that does not increase your knee pain. Never let your hips drop below your knees.  Slowly return upright, pushing with your legs, not pulling with your hands. Repeat __________ times. Complete this exercise __________ times per day.  STRENGTH - Quadriceps, Wall Slides  Follow guidelines for form closely. Increased knee pain often results from poorly placed feet or knees.    Lean against a smooth wall or door and walk your feet out 18-24 inches. Place your feet hip-width apart.  Slowly slide down the wall or door until your knees bend __________ degrees.* Keep your knees over your heels, not your toes, and in line with your hips, not falling to either side.  Hold for __________ seconds. Stand up to rest for __________ seconds in between each repetition. Repeat __________ times. Complete this exercise __________ times per day. * Your physician, physical therapist, or athletic trainer will alter this angle based on your symptoms and progress.   This information is not intended to replace advice given to you by your health care provider. Make sure you discuss any questions you have with your health care  provider.   Document Released: 06/12/2005 Document Revised: 08/19/2014 Document Reviewed: 11/10/2008 Elsevier Interactive Patient Education 2016 [employer] Inc.  

## 2016-06-10 ENCOUNTER — Encounter: Payer: Self-pay | Admitting: Internal Medicine

## 2016-06-10 NOTE — Progress Notes (Signed)
Subjective:    Patient ID: Patrick Hahn, male    DOB: [birth year], 35 y.o.   MRN: [id number]  HPI  Pt presents to the clinic today with c/o right knee pain. He reports this started 1 week ago. The pain is located in the kneecap. He describes the pain as sharp and stabbing. It only hurts when direct pressure is applied to it. He denies numbness, tingling, bruising or swelling. He denies any injury to the area. He has put ice on it but has not tried anything OTC.  Review of Systems      History reviewed. No pertinent past medical history.  No current outpatient prescriptions on file.   No current facility-administered medications for this visit.     No Known Allergies  Family History  Problem Relation Age of Onset  . Cancer Maternal Uncle     throat  . Diabetes Neg Hx   . Early death Neg Hx   . Heart disease Neg Hx   . Stroke Neg Hx     Social History   Social History  . Marital status: Single    Spouse name: N/A  . Number of children: N/A  . Years of education: N/A   Occupational History  . Not on file.   Social History Main Topics  . Smoking status: Never Smoker  . Smokeless tobacco: Never Used  . Alcohol use 0.6 oz/week    1 Shots of liquor per week     Comment: occasional  . Drug use: No  . Sexual activity: Yes   Other Topics Concern  . Not on file   Social History Narrative  . No narrative on file     Constitutional: Denies fever, malaise, fatigue, headache or abrupt weight changes.  Musculoskeletal: Pt reports right knee pain. Denies decrease in range of motion, difficulty with gait, muscle pain or joint swelling.  Skin: Denies redness, rashes, lesions or ulcercations.  Neurological: Denies dizziness, difficulty with memory, difficulty with speech or problems with balance and coordination.    No other specific complaints in a complete review of systems (except as listed in HPI above).  Objective:   Physical Exam  BP 110/74   Pulse 76    Temp 98.2 F (36.8 C) (Oral)   Wt 213 lb (96.6 kg)   SpO2 98%   BMI 29.71 kg/m  Wt Readings from Last 3 Encounters:  06/07/16 213 lb (96.6 kg)  02/23/16 204 lb (92.5 kg)  02/12/16 202 lb 4 oz (91.7 kg)    General: Appears his stated age, well developed, well nourished in NAD. Skin: Warm, dry and intact.  Musculoskeletal: Normal flexion, extension of the knee. No joint swelling noted. Pain with palpation over the inferior aspect of the patella. No pain with palpation of the tendons, joint space or bursa. No difficulty with gait.  BMET    Component Value Date/Time   NA 139 02/23/2016 1420   K 4.1 02/23/2016 1420   CL 105 02/23/2016 1420   CO2 27 02/23/2016 1420   GLUCOSE 89 02/23/2016 1420   BUN 13 02/23/2016 1420   CREATININE 0.99 02/23/2016 1420   CALCIUM 9.0 02/23/2016 1420    Lipid Panel     Component Value Date/Time   CHOL 93 02/23/2016 1420   TRIG 45.0 02/23/2016 1420   HDL 39.50 02/23/2016 1420   CHOLHDL 2 02/23/2016 1420   VLDL 9.0 02/23/2016 1420   LDLCALC 44 02/23/2016 1420    CBC    Component Value  Date/Time   WBC 5.3 02/23/2016 1420   RBC 4.22 02/23/2016 1420   HGB 11.9 (L) 02/23/2016 1420   HCT 36.0 (L) 02/23/2016 1420   PLT 159.0 02/23/2016 1420   MCV 85.4 02/23/2016 1420   MCHC 33.1 02/23/2016 1420   RDW 15.9 (H) 02/23/2016 1420    Hgb A1C No results found for: HGBA1C      Assessment & Plan:   Right knee pain:  ? If he has a small tear in the patellar-tibial tendon Advised him to try Ibuprofen OTC Ice as needed He can wear a knee brace for support If pain persist or worsens, can workup further with xray, MRI vs physical therapy Knee exercised given  RTC as needed or if symptoms persist or worsen. Nicki ReaperBAITY, Patrick Ausley, NP

## 2017-01-22 ENCOUNTER — Ambulatory Visit (INDEPENDENT_AMBULATORY_CARE_PROVIDER_SITE_OTHER): Payer: 59 | Admitting: Internal Medicine

## 2017-01-22 ENCOUNTER — Ambulatory Visit: Payer: 59 | Admitting: Internal Medicine

## 2017-01-22 ENCOUNTER — Encounter: Payer: Self-pay | Admitting: Internal Medicine

## 2017-01-22 VITALS — BP 108/68 | HR 97 | Temp 98.2°F | Wt 197.8 lb

## 2017-01-22 DIAGNOSIS — L731 Pseudofolliculitis barbae: Secondary | ICD-10-CM | POA: Diagnosis not present

## 2017-01-22 DIAGNOSIS — F419 Anxiety disorder, unspecified: Secondary | ICD-10-CM | POA: Diagnosis not present

## 2017-01-22 DIAGNOSIS — L309 Dermatitis, unspecified: Secondary | ICD-10-CM | POA: Diagnosis not present

## 2017-01-22 MED ORDER — TRIAMCINOLONE ACETONIDE 0.1 % EX CREA: 1.0000 "application " | TOPICAL_CREAM | Freq: Two times a day (BID) | CUTANEOUS | 0 refills | Status: DC

## 2017-01-22 NOTE — Patient Instructions (Signed)

## 2017-01-22 NOTE — Progress Notes (Signed)
Subjective:    Patient ID: Patrick Hahn, male    DOB: 1980-10-08, 36 y.o.   MRN: [id number]  HPI  Pt presents to the clinic today with c/o a rash. He reports he noticed this 1 year ago. The rash looks like little white scaly bumps. It is located on his elbows, knees, stomach. It can be itchy at times. He has tried Vaseline lotion with minimal relief.  He also c/o a lump in his left axillary region. He noticed this 1 month ago. He reports it has actually gotten smaller in size. It is not tender, red or warm to the touch. He has not noticed any drainage in the area. He has tried warm compresses with good relief.  He also c/o a feeling of jitteriness, nausea and paranoia. He reports this occurs while on planes, riding in cars, in movie theaters and restaurants. He is not sure what is causing this. He thinks it may be anxiety but is not sure. He has never had anxiety in the past.  Review of Systems      No past medical history on file.  No current outpatient prescriptions on file.   No current facility-administered medications for this visit.     No Known Allergies  Family History  Problem Relation Age of Onset  . Cancer Maternal Uncle        throat  . Diabetes Neg Hx   . Early death Neg Hx   . Heart disease Neg Hx   . Stroke Neg Hx     Social History   Social History  . Marital status: Single    Spouse name: N/A  . Number of children: N/A  . Years of education: N/A   Occupational History  . Not on file.   Social History Main Topics  . Smoking status: Never Smoker  . Smokeless tobacco: Never Used  . Alcohol use 0.6 oz/week    1 Shots of liquor per week     Comment: occasional  . Drug use: No  . Sexual activity: Yes   Other Topics Concern  . Not on file   Social History Narrative  . No narrative on file     Constitutional: Denies fever, malaise, fatigue, headache or abrupt weight changes.   Skin: Pt reports rash and mass in left armpit. Denies redness or  ulcercations.  Psych: Pt reports anxiety. Denies depression, SI/HI.  No other specific complaints in a complete review of systems (except as listed in HPI above).  Objective:   Physical Exam   Pulse 97   Temp 98.2 F (36.8 C) (Oral)   Wt 197 lb 12 oz (89.7 kg)   SpO2 98%   BMI 27.58 kg/m  Wt Readings from Last 3 Encounters:  01/22/17 197 lb 12 oz (89.7 kg)  06/07/16 213 lb (96.6 kg)  02/23/16 204 lb (92.5 kg)    General: Appears his stated age, well developed, well nourished in NAD. Skin: Eczema noted on bilateral elbows and knees. Ingrown hair noted in left armpit. Psych: Pt is slightly anxious appearing today.   BMET    Component Value Date/Time   NA 139 02/23/2016 1420   K 4.1 02/23/2016 1420   CL 105 02/23/2016 1420   CO2 27 02/23/2016 1420   GLUCOSE 89 02/23/2016 1420   BUN 13 02/23/2016 1420   CREATININE 0.99 02/23/2016 1420   CALCIUM 9.0 02/23/2016 1420    Lipid Panel     Component Value Date/Time   CHOL 93  02/23/2016 1420   TRIG 45.0 02/23/2016 1420   HDL 39.50 02/23/2016 1420   CHOLHDL 2 02/23/2016 1420   VLDL 9.0 02/23/2016 1420   LDLCALC 44 02/23/2016 1420    CBC    Component Value Date/Time   WBC 5.3 02/23/2016 1420   RBC 4.22 02/23/2016 1420   HGB 11.9 (L) 02/23/2016 1420   HCT 36.0 (L) 02/23/2016 1420   PLT 159.0 02/23/2016 1420   MCV 85.4 02/23/2016 1420   MCHC 33.1 02/23/2016 1420   RDW 15.9 (H) 02/23/2016 1420    Hgb A1C No results found for: HGBA1C         Assessment & Plan:   Eczema:  Avoid hot showers eRx for Triamcinolone cream BID  Ingrown Hair:  Continue warm compresses Avoid wearing deodorant for now  Anxiety:  Support offered today He declines referral for psych for CBT at this time  Return precautions discussed Nicki Reaper, NP

## 2017-02-25 ENCOUNTER — Ambulatory Visit (INDEPENDENT_AMBULATORY_CARE_PROVIDER_SITE_OTHER): Payer: 59 | Admitting: Internal Medicine

## 2017-02-25 ENCOUNTER — Encounter: Payer: Self-pay | Admitting: Internal Medicine

## 2017-02-25 VITALS — BP 110/78 | HR 87 | Temp 98.1°F | Wt 192.0 lb

## 2017-02-25 DIAGNOSIS — K5900 Constipation, unspecified: Secondary | ICD-10-CM

## 2017-02-25 NOTE — Progress Notes (Signed)
Subjective:    Patient ID: Patrick Hahn, male    DOB: 11-01-1980, 36 y.o.   MRN: [id number]  HPI  Pt presents to the clinic today with c/o constipation. He reports this started 3 days ago. He had been having abdominal bloating and gas but no BM. He reports he just had a BM right before the appt and he feels much better. He did not see any blood in his stool. He denies nausea or vomiting. He did not taken anything OTC. He reports he recently has been eating a lot of cheese and not drinking a lot of water. He has not really had any issues with constipation in the past.  Review of Systems      No past medical history on file.  Current Outpatient Prescriptions  Medication Sig Dispense Refill  . triamcinolone cream (KENALOG) 0.1 % Apply 1 application topically 2 (two) times daily. 30 g 0   No current facility-administered medications for this visit.     No Known Allergies  Family History  Problem Relation Age of Onset  . Cancer Maternal Uncle        throat  . Diabetes Neg Hx   . Early death Neg Hx   . Heart disease Neg Hx   . Stroke Neg Hx     Social History   Social History  . Marital status: Single    Spouse name: N/A  . Number of children: N/A  . Years of education: N/A   Occupational History  . Not on file.   Social History Main Topics  . Smoking status: Never Smoker  . Smokeless tobacco: Never Used  . Alcohol use 0.6 oz/week    1 Shots of liquor per week     Comment: occasional  . Drug use: No  . Sexual activity: Yes   Other Topics Concern  . Not on file   Social History Narrative  . No narrative on file     Constitutional: Denies fever, malaise, fatigue, headache or abrupt weight changes.  Gastrointestinal: Pt reports constipation. Denies abdominal pain, bloating, diarrhea or blood in the stool.    No other specific complaints in a complete review of systems (except as listed in HPI above).  Objective:   Physical Exam   BP 110/78   Pulse 87    Temp 98.1 F (36.7 C) (Oral)   Wt 192 lb (87.1 kg)   SpO2 98%   BMI 26.78 kg/m  Wt Readings from Last 3 Encounters:  02/25/17 192 lb (87.1 kg)  01/22/17 197 lb 12 oz (89.7 kg)  06/07/16 213 lb (96.6 kg)    General: Appears his stated age, well developed, well nourished in NAD. Abdomen: Soft and nontender. Normal bowel sounds. No distention or masses noted.   BMET    Component Value Date/Time   NA 139 02/23/2016 1420   K 4.1 02/23/2016 1420   CL 105 02/23/2016 1420   CO2 27 02/23/2016 1420   GLUCOSE 89 02/23/2016 1420   BUN 13 02/23/2016 1420   CREATININE 0.99 02/23/2016 1420   CALCIUM 9.0 02/23/2016 1420    Lipid Panel     Component Value Date/Time   CHOL 93 02/23/2016 1420   TRIG 45.0 02/23/2016 1420   HDL 39.50 02/23/2016 1420   CHOLHDL 2 02/23/2016 1420   VLDL 9.0 02/23/2016 1420   LDLCALC 44 02/23/2016 1420    CBC    Component Value Date/Time   WBC 5.3 02/23/2016 1420   RBC 4.22 02/23/2016  1420   HGB 11.9 (L) 02/23/2016 1420   HCT 36.0 (L) 02/23/2016 1420   PLT 159.0 02/23/2016 1420   MCV 85.4 02/23/2016 1420   MCHC 33.1 02/23/2016 1420   RDW 15.9 (H) 02/23/2016 1420    Hgb A1C No results found for: HGBA1C         Assessment & Plan:   Constipation:  Resolved right before his appt Advised him to drink plenty of water Consume a high fiber diet Can take Mirilax OTC as needed  Return precautions discussed Nicki ReaperBAITY, [name], NP

## 2017-02-25 NOTE — Patient Instructions (Signed)
Constipation, Adult °Constipation is when a person: °· Poops (has a bowel movement) fewer times in a week than normal. °· Has a hard time pooping. °· Has poop that is dry, hard, or bigger than normal. ° °Follow these instructions at home: °Eating and drinking ° °· Eat foods that have a lot of fiber, such as: °? Fresh fruits and vegetables. °? Whole grains. °? Beans. °· Eat less of foods that are high in fat, low in fiber, or overly processed, such as: °? French fries. °? Hamburgers. °? Cookies. °? Candy. °? Soda. °· Drink enough fluid to keep your pee (urine) clear or pale yellow. °General instructions °· Exercise regularly or as told by your doctor. °· Go to the restroom when you feel like you need to poop. Do not hold it in. °· Take over-the-counter and prescription medicines only as told by your doctor. These include any fiber supplements. °· Do pelvic floor retraining exercises, such as: °? Doing deep breathing while relaxing your lower belly (abdomen). °? Relaxing your pelvic floor while pooping. °· Watch your condition for any changes. °· Keep all follow-up visits as told by your doctor. This is important. °Contact a doctor if: °· You have pain that gets worse. °· You have a fever. °· You have not pooped for 4 days. °· You throw up (vomit). °· You are not hungry. °· You lose weight. °· You are bleeding from the anus. °· You have thin, pencil-like poop (stool). °Get help right away if: °· You have a fever, and your symptoms suddenly get worse. °· You leak poop or have blood in your poop. °· Your belly feels hard or bigger than normal (is bloated). °· You have very bad belly pain. °· You feel dizzy or you faint. °This information is not intended to replace advice given to you by your health care provider. Make sure you discuss any questions you have with your health care provider. °Document Released: 01/15/2008 Document Revised: 02/16/2016 Document Reviewed: 01/17/2016 °Elsevier Interactive Patient Education ©  2017 [employer] Inc. ° °

## 2017-03-24 ENCOUNTER — Ambulatory Visit (INDEPENDENT_AMBULATORY_CARE_PROVIDER_SITE_OTHER): Payer: 59 | Admitting: Internal Medicine

## 2017-03-24 ENCOUNTER — Encounter: Payer: Self-pay | Admitting: Internal Medicine

## 2017-03-24 VITALS — BP 116/78 | HR 92 | Temp 97.8°F | Wt 198.0 lb

## 2017-03-24 DIAGNOSIS — F419 Anxiety disorder, unspecified: Secondary | ICD-10-CM

## 2017-03-24 MED ORDER — TRIAMCINOLONE ACETONIDE 0.1 % EX CREA: 1.0000 "application " | TOPICAL_CREAM | Freq: Two times a day (BID) | CUTANEOUS | 1 refills | Status: DC

## 2017-03-24 MED ORDER — LORAZEPAM 0.5 MG PO TABS: 0.5000 mg | ORAL_TABLET | Freq: Every day | ORAL | 0 refills | Status: DC | PRN

## 2017-03-24 NOTE — Progress Notes (Signed)
Subjective:    Patient ID: Patrick Hahn, male    DOB: 1981-01-13, 36 y.o.   MRN: [id number]  HPI  Pt presents to the clinic today to follow up anxiety. He was seen 2 months ago for the same. He has noticed that it seems to be triggered by tight spaces and social situations. He declined referral to psych for CBT or treatment with medication at that time. He reports he is traveling to [facility] on Thursday and is having a lot of airplane anxiety. He would like to have something to help him just while he travels.  Review of Systems      No past medical history on file.  Current Outpatient Prescriptions  Medication Sig Dispense Refill  . triamcinolone cream (KENALOG) 0.1 % Apply 1 application topically 2 (two) times daily. 30 g 0   No current facility-administered medications for this visit.     No Known Allergies  Family History  Problem Relation Age of Onset  . Cancer Maternal Uncle        throat  . Diabetes Neg Hx   . Early death Neg Hx   . Heart disease Neg Hx   . Stroke Neg Hx     Social History   Social History  . Marital status: Single    Spouse name: N/A  . Number of children: N/A  . Years of education: N/A   Occupational History  . Not on file.   Social History Main Topics  . Smoking status: Never Smoker  . Smokeless tobacco: Never Used  . Alcohol use 0.6 oz/week    1 Shots of liquor per week     Comment: occasional  . Drug use: No  . Sexual activity: Yes   Other Topics Concern  . Not on file   Social History Narrative  . No narrative on file     Constitutional: Denies fever, malaise, fatigue, headache or abrupt weight changes.  Psych: Pt reports anxiety. Denies depression, SI/HI.  No other specific complaints in a complete review of systems (except as listed in HPI above).  Objective:   Physical Exam   BP 116/78   Pulse 92   Temp 97.8 F (36.6 C) (Oral)   Wt 198 lb (89.8 kg)   SpO2 99%   BMI 27.62 kg/m  Wt Readings from Last 3  Encounters:  03/24/17 198 lb (89.8 kg)  02/25/17 192 lb (87.1 kg)  01/22/17 197 lb 12 oz (89.7 kg)    General: Appears his stated age, well developed, well nourished in NAD. Neurological: Alert and oriented.  Psychiatric: He is mildly anxious appearing. Judgment and thought content normal.     BMET    Component Value Date/Time   NA 139 02/23/2016 1420   K 4.1 02/23/2016 1420   CL 105 02/23/2016 1420   CO2 27 02/23/2016 1420   GLUCOSE 89 02/23/2016 1420   BUN 13 02/23/2016 1420   CREATININE 0.99 02/23/2016 1420   CALCIUM 9.0 02/23/2016 1420    Lipid Panel     Component Value Date/Time   CHOL 93 02/23/2016 1420   TRIG 45.0 02/23/2016 1420   HDL 39.50 02/23/2016 1420   CHOLHDL 2 02/23/2016 1420   VLDL 9.0 02/23/2016 1420   LDLCALC 44 02/23/2016 1420    CBC    Component Value Date/Time   WBC 5.3 02/23/2016 1420   RBC 4.22 02/23/2016 1420   HGB 11.9 (L) 02/23/2016 1420   HCT 36.0 (L) 02/23/2016 1420   PLT  159.0 02/23/2016 1420   MCV 85.4 02/23/2016 1420   MCHC 33.1 02/23/2016 1420   RDW 15.9 (H) 02/23/2016 1420    Hgb A1C No results found for: HGBA1C         Assessment & Plan:   Travel Anxiety:  eRx for Ativan 0.5 mg daily prn Advised him to take 30 minutes before boarding Advised him no to take on a daily basis, if he is feeling anxious that often, we need to use a SSRI  Return precautions discussed Nicki Reaper, NP

## 2017-03-24 NOTE — Patient Instructions (Signed)

## 2017-05-05 ENCOUNTER — Ambulatory Visit (INDEPENDENT_AMBULATORY_CARE_PROVIDER_SITE_OTHER): Payer: 59 | Admitting: Internal Medicine

## 2017-05-05 ENCOUNTER — Encounter: Payer: Self-pay | Admitting: Internal Medicine

## 2017-05-05 ENCOUNTER — Encounter: Payer: 59 | Admitting: Internal Medicine

## 2017-05-05 VITALS — BP 120/78 | HR 98 | Temp 97.8°F | Ht 71.0 in | Wt 201.5 lb

## 2017-05-05 DIAGNOSIS — M549 Dorsalgia, unspecified: Secondary | ICD-10-CM

## 2017-05-05 DIAGNOSIS — G8929 Other chronic pain: Secondary | ICD-10-CM

## 2017-05-05 DIAGNOSIS — M545 Low back pain, unspecified: Secondary | ICD-10-CM

## 2017-05-05 DIAGNOSIS — Z0001 Encounter for general adult medical examination with abnormal findings: Secondary | ICD-10-CM

## 2017-05-05 DIAGNOSIS — Z113 Encounter for screening for infections with a predominantly sexual mode of transmission: Secondary | ICD-10-CM

## 2017-05-05 DIAGNOSIS — Z0000 Encounter for general adult medical examination without abnormal findings: Secondary | ICD-10-CM

## 2017-05-05 LAB — COMPREHENSIVE METABOLIC PANEL
ALK PHOS: 54 U/L (ref 39–117)
ALT: 17 U/L (ref 0–53)
AST: 19 U/L (ref 0–37)
Albumin: 4.2 g/dL (ref 3.5–5.2)
BILIRUBIN TOTAL: 1.8 mg/dL — AB (ref 0.2–1.2)
BUN: 9 mg/dL (ref 6–23)
CALCIUM: 9.2 mg/dL (ref 8.4–10.5)
CO2: 30 mEq/L (ref 19–32)
Chloride: 105 mEq/L (ref 96–112)
Creatinine, Ser: 0.9 mg/dL (ref 0.40–1.50)
GFR: 123.33 mL/min (ref >60.00)
GLUCOSE: 91 mg/dL (ref 70–99)
Potassium: 3.7 mEq/L (ref 3.5–5.1)
Sodium: 141 mEq/L (ref 135–145)
TOTAL PROTEIN: 7.6 g/dL (ref 6.0–8.3)

## 2017-05-05 LAB — CBC
HCT: 38 % — ABNORMAL LOW (ref 39.0–52.0)
Hemoglobin: 13.2 g/dL (ref 13.0–17.0)
MCHC: 34.7 g/dL (ref 30.0–36.0)
MCV: 82.8 fl (ref 78.0–100.0)
Platelets: 135 10*3/uL — ABNORMAL LOW (ref 150.0–400.0)
RBC: 4.58 Mil/uL (ref 4.22–5.81)
RDW: 16.1 % — ABNORMAL HIGH (ref 11.5–15.5)
WBC: 4.7 10*3/uL (ref 4.0–10.5)

## 2017-05-05 LAB — LIPID PANEL
CHOLESTEROL: 97 mg/dL (ref 0–200)
HDL: 39.8 mg/dL (ref >39.00)
LDL Cholesterol: 42 mg/dL (ref 0–99)
NONHDL: 56.85
Total CHOL/HDL Ratio: 2
Triglycerides: 72 mg/dL (ref 0.0–149.0)
VLDL: 14.4 mg/dL (ref 0.0–40.0)

## 2017-05-05 NOTE — Assessment & Plan Note (Signed)
Refilled Tramadol, Robaxin and Meloxicam today

## 2017-05-05 NOTE — Patient Instructions (Signed)

## 2017-05-05 NOTE — Progress Notes (Signed)
Subjective:    Patient ID: Patrick Hahn, male    DOB: 1981/04/22, 36 y.o.   MRN: [id number]  HPI  Pt presents to the clinic today for his annual exam. He is also due to follow up chronic conditions.  Chronic Back Pain: Intermittent. He would like refills of his Tramadol, Meloxicam and Robaxin to take if needed in case his back pain flares up.  Flu: never Tetanus: 02/2016 Dentist: annually  Diet: He does eat meat. He consumes fruits and veggies. He does eat fried foods. He drinks mostly juice and sweet tea. Exercise: None  Review of Systems  No past medical history on file.  Current Outpatient Prescriptions  Medication Sig Dispense Refill  . LORazepam (ATIVAN) 0.5 MG tablet Take 1 tablet (0.5 mg total) by mouth daily as needed for anxiety. 10 tablet 0  . triamcinolone cream (KENALOG) 0.1 % Apply 1 application topically 2 (two) times daily. 45 g 1   No current facility-administered medications for this visit.     No Known Allergies  Family History  Problem Relation Age of Onset  . Cancer Maternal Uncle        throat  . Diabetes Neg Hx   . Early death Neg Hx   . Heart disease Neg Hx   . Stroke Neg Hx     Social History   Social History  . Marital status: Single    Spouse name: N/A  . Number of children: N/A  . Years of education: N/A   Occupational History  . Not on file.   Social History Main Topics  . Smoking status: Never Smoker  . Smokeless tobacco: Never Used  . Alcohol use 0.6 oz/week    1 Shots of liquor per week     Comment: occasional  . Drug use: No  . Sexual activity: Yes   Other Topics Concern  . Not on file   Social History Narrative  . No narrative on file     Constitutional: Denies fever, malaise, fatigue, headache or abrupt weight changes.  HEENT: Denies eye pain, eye redness, ear pain, ringing in the ears, wax buildup, runny nose, nasal congestion, bloody nose, or sore throat. Respiratory: Denies difficulty breathing, shortness of  breath, cough or sputum production.   Cardiovascular: Denies chest pain, chest tightness, palpitations or swelling in the hands or feet.  Gastrointestinal: Denies abdominal pain, bloating, constipation, diarrhea or blood in the stool.  GU: Denies urgency, frequency, pain with urination, burning sensation, blood in urine, odor or discharge. Musculoskeletal: Pt reports intermittent low back pain. Denies decrease in range of motion, difficulty with gait, or joint  swelling.  Skin: Denies redness, rashes, lesions or ulcercations.  Neurological: Denies dizziness, difficulty with memory, difficulty with speech or problems with balance and coordination.  Psych: Denies anxiety, depression, SI/HI.  No other specific complaints in a complete review of systems (except as listed in HPI above).     Objective:   Physical Exam  BP 120/78   Pulse 98   Temp 97.8 F (36.6 C) (Oral)   Ht  (1.803 m)   Wt 201 lb 8 oz (91.4 kg)   SpO2 98%   BMI 28.10 kg/m  Wt Readings from Last 3 Encounters:  05/05/17 201 lb 8 oz (91.4 kg)  03/24/17 198 lb (89.8 kg)  02/25/17 192 lb (87.1 kg)    General: Appears his stated age, well developed, well nourished in NAD. Skin: Warm, very dry and intact.  HEENT: Head: normal shape  and size; Eyes: sclera white, no icterus, conjunctiva pink, PERRLA and EOMs intact; Ears: Tm's gray and intact, normal light reflex; Throat/Mouth: Teeth present, mucosa pink and moist, no exudate, lesions or ulcerations noted.  Neck:  Neck supple, trachea midline. No masses, lumps or thyromegaly present.  Cardiovascular: Normal rate and rhythm. S1,S2 noted.  No murmur, rubs or gallops noted. No JVD or BLE edema.  Pulmonary/Chest: Normal effort and positive vesicular breath sounds. No respiratory distress. No wheezes, rales or ronchi noted.  Abdomen: Soft and nontender. Normal bowel sounds. No distention or masses noted. Liver, spleen and kidneys non palpable. Musculoskeletal: Strength 5/5  BUE/BLE. No difficulty with gait.  Neurological: Alert and oriented. Cranial nerves II-XII grossly intact. Coordination normal.  Psychiatric: Mood and affect normal. Behavior is normal. Judgment and thought content normal.    BMET    Component Value Date/Time   NA 139 02/23/2016 1420   K 4.1 02/23/2016 1420   CL 105 02/23/2016 1420   CO2 27 02/23/2016 1420   GLUCOSE 89 02/23/2016 1420   BUN 13 02/23/2016 1420   CREATININE 0.99 02/23/2016 1420   CALCIUM 9.0 02/23/2016 1420    Lipid Panel     Component Value Date/Time   CHOL 93 02/23/2016 1420   TRIG 45.0 02/23/2016 1420   HDL 39.50 02/23/2016 1420   CHOLHDL 2 02/23/2016 1420   VLDL 9.0 02/23/2016 1420   LDLCALC 44 02/23/2016 1420    CBC    Component Value Date/Time   WBC 5.3 02/23/2016 1420   RBC 4.22 02/23/2016 1420   HGB 11.9 (L) 02/23/2016 1420   HCT 36.0 (L) 02/23/2016 1420   PLT 159.0 02/23/2016 1420   MCV 85.4 02/23/2016 1420   MCHC 33.1 02/23/2016 1420   RDW 15.9 (H) 02/23/2016 1420    Hgb A1C No results found for: HGBA1C          Assessment & Plan:   Preventative Health Maitenance:  Encouraged him to get a flu shot in the fall Tetanus UTD Encouraged him to consume a balanced diet and exercise regimen Advised him to see an eye doctor and dentist annually Will check CBC, CMET, Lipid, HIV, RPR and urine gonorrhea, chlamydia and trichomonas  RTC in 1 year, sooner if needed Nicki Reaper, NP

## 2017-05-06 LAB — C. TRACHOMATIS/N. GONORRHOEAE RNA
C. trachomatis RNA, TMA: NOT DETECTED
N. GONORRHOEAE RNA, TMA: NOT DETECTED

## 2017-05-06 LAB — HIV ANTIBODY (ROUTINE TESTING W REFLEX): HIV: NONREACTIVE

## 2017-05-06 LAB — RPR: RPR: NONREACTIVE

## 2017-06-18 ENCOUNTER — Encounter: Payer: Self-pay | Admitting: Internal Medicine

## 2017-06-18 ENCOUNTER — Ambulatory Visit (INDEPENDENT_AMBULATORY_CARE_PROVIDER_SITE_OTHER): Payer: 59 | Admitting: Internal Medicine

## 2017-06-18 VITALS — BP 122/78 | HR 91 | Temp 98.2°F | Wt 205.0 lb

## 2017-06-18 DIAGNOSIS — A084 Viral intestinal infection, unspecified: Secondary | ICD-10-CM

## 2017-06-18 NOTE — Patient Instructions (Signed)
Food Choices to Help Relieve Diarrhea, Adult When you have diarrhea, the foods you eat and your eating habits are very important. Choosing the right foods and drinks can help:  Relieve diarrhea.  Replace lost fluids and nutrients.  Prevent dehydration.  What general guidelines should I follow? Relieving diarrhea  Choose foods with less than 2 g or .07 oz. of fiber per serving.  Limit fats to less than 8 tsp (38 g or 1.34 oz.) a day.  Avoid the following: ? Foods and beverages sweetened with high-fructose corn syrup, honey, or sugar alcohols such as xylitol, sorbitol, and mannitol. ? Foods that contain a lot of fat or sugar. ? Fried, greasy, or spicy foods. ? High-fiber grains, breads, and cereals. ? Raw fruits and vegetables.  Eat foods that are rich in probiotics. These foods include dairy products such as yogurt and fermented milk products. They help increase healthy bacteria in the stomach and intestines (gastrointestinal tract, or GI tract).  If you have lactose intolerance, avoid dairy products. These may make your diarrhea worse.  Take medicine to help stop diarrhea (antidiarrheal medicine) only as told by your health care provider. Replacing nutrients  Eat small meals or snacks every 3-4 hours.  Eat bland foods, such as white rice, toast, or baked potato, until your diarrhea starts to get better. Gradually reintroduce nutrient-rich foods as tolerated or as told by your health care provider. This includes: ? Well-cooked protein foods. ? Peeled, seeded, and soft-cooked fruits and vegetables. ? Low-fat dairy products.  Take vitamin and mineral supplements as told by your health care provider. Preventing dehydration   Start by sipping water or a special solution to prevent dehydration (oral rehydration solution, ORS). Urine that is clear or pale yellow means that you are getting enough fluid.  Try to drink at least 8-10 cups of fluid each day to help replace lost  fluids.  You may add other liquids in addition to water, such as clear juice or decaffeinated sports drinks, as tolerated or as told by your health care provider.  Avoid drinks with caffeine, such as coffee, tea, or soft drinks.  Avoid alcohol. What foods are recommended? The items listed may not be a complete list. Talk with your health care provider about what dietary choices are best for you. Grains White rice. White, French, or pita breads (fresh or toasted), including plain rolls, buns, or bagels. White pasta. Saltine, soda, or graham crackers. Pretzels. Low-fiber cereal. Cooked cereals made with water (such as cornmeal, farina, or cream cereals). Plain muffins. Matzo. Melba toast. Zwieback. Vegetables Potatoes (without the skin). Most well-cooked and canned vegetables without skins or seeds. Tender lettuce. Fruits Apple sauce. Fruits canned in juice. Cooked apricots, cherries, grapefruit, peaches, pears, or plums. Fresh bananas and cantaloupe. Meats and other protein foods Baked or boiled chicken. Eggs. Tofu. Fish. Seafood. Smooth nut butters. Ground or well-cooked tender beef, ham, veal, lamb, pork, or poultry. Dairy Plain yogurt, kefir, and unsweetened liquid yogurt. Lactose-free milk, buttermilk, skim milk, or soy milk. Low-fat or nonfat hard cheese. Beverages Water. Low-calorie sports drinks. Fruit juices without pulp. Strained tomato and vegetable juices. Decaffeinated teas. Sugar-free beverages not sweetened with sugar alcohols. Oral rehydration solutions, if approved by your health care provider. Seasoning and other foods Bouillon, broth, or soups made from recommended foods. What foods are not recommended? The items listed may not be a complete list. Talk with your health care provider about what dietary choices are best for you. Grains Whole grain, whole wheat,   bran, or rye breads, rolls, pastas, and crackers. Wild or brown rice. Whole grain or bran cereals. Barley. Oats and  oatmeal. Corn tortillas or taco shells. Granola. Popcorn. Vegetables Raw vegetables. Fried vegetables. Cabbage, broccoli, Brussels sprouts, artichokes, baked beans, beet greens, corn, kale, legumes, peas, sweet potatoes, and yams. Potato skins. Cooked spinach and cabbage. Fruits Dried fruit, including raisins and dates. Raw fruits. Stewed or dried prunes. Canned fruits with syrup. Meat and other protein foods Fried or fatty meats. Deli meats. Chunky nut butters. Nuts and seeds. Beans and lentils. Bacon. Hot dogs. Sausage. Dairy High-fat cheeses. Whole milk, chocolate milk, and beverages made with milk, such as milk shakes. Half-and-half. Cream. sour cream. Ice cream. Beverages Caffeinated beverages (such as coffee, tea, soda, or energy drinks). Alcoholic beverages. Fruit juices with pulp. Prune juice. Soft drinks sweetened with high-fructose corn syrup or sugar alcohols. High-calorie sports drinks. Fats and oils Butter. Cream sauces. Margarine. Salad oils. Plain salad dressings. Olives. Avocados. Mayonnaise. Sweets and desserts Sweet rolls, doughnuts, and sweet breads. Sugar-free desserts sweetened with sugar alcohols such as xylitol and sorbitol. Seasoning and other foods Honey. Hot sauce. Chili powder. Gravy. Cream-based or milk-based soups. Pancakes and waffles. Summary  When you have diarrhea, the foods you eat and your eating habits are very important.  Make sure you get at least 8-10 cups of fluid each day, or enough to keep your urine clear or pale yellow.  Eat bland foods and gradually reintroduce healthy, nutrient-rich foods as tolerated, or as told by your health care provider.  Avoid high-fiber, fried, greasy, or spicy foods. This information is not intended to replace advice given to you by your health care provider. Make sure you discuss any questions you have with your health care provider. Document Released: 10/19/2003 Document Revised: 07/26/2016 Document Reviewed:  07/26/2016 Elsevier Interactive Patient Education  2017 [employer]  

## 2017-06-18 NOTE — Progress Notes (Signed)
Subjective:    Patient ID: Patrick Hahn, male    DOB: May 31, 1981, 36 y.o.   MRN: [id number]  HPI  Pt presents to the clinic today with c/o nausea, vomiting and diarrhea. He reports this occurred 2 days ago. He has not had any symptoms today. He denies fever, chills or body aches. He has had contacts with people with similar symptoms. He reports his son has the same thing and he needs a work note to be off the next 2 days so that he can take care of his son.  Review of Systems      No past medical history on file.  Current Outpatient Medications  Medication Sig Dispense Refill  . LORazepam (ATIVAN) 0.5 MG tablet Take 1 tablet (0.5 mg total) by mouth daily as needed for anxiety. (Patient not taking: Reported on 05/05/2017) 10 tablet 0  . triamcinolone cream (KENALOG) 0.1 % Apply 1 application topically 2 (two) times daily. 45 g 1   No current facility-administered medications for this visit.     No Known Allergies  Family History  Problem Relation Age of Onset  . Cancer Maternal Uncle        throat  . Diabetes Neg Hx   . Early death Neg Hx   . Heart disease Neg Hx   . Stroke Neg Hx     Social History   Socioeconomic History  . Marital status: Single    Spouse name: Not on file  . Number of children: Not on file  . Years of education: Not on file  . Highest education level: Not on file  Social Needs  . Financial resource strain: Not on file  . Food insecurity - worry: Not on file  . Food insecurity - inability: Not on file  . Transportation needs - medical: Not on file  . Transportation needs - non-medical: Not on file  Occupational History  . Not on file  Tobacco Use  . Smoking status: Never Smoker  . Smokeless tobacco: Never Used  Substance and Sexual Activity  . Alcohol use: Yes    Alcohol/week: 0.6 oz    Types: 1 Shots of liquor per week    Comment: occasional  . Drug use: No  . Sexual activity: Yes  Other Topics Concern  . Not on file  Social History  Narrative  . Not on file     Constitutional: Denies fever, malaise, fatigue, headache or abrupt weight changes.  Gastrointestinal: Pt reports nausea, vomiting and diarrhea. Denies abdominal pain, bloating, constipation, or blood in the stool.   No other specific complaints in a complete review of systems (except as listed in HPI above).  Objective:   Physical Exam   BP 122/78   Pulse 91   Temp 98.2 F (36.8 C) (Oral)   Wt 205 lb (93 kg)   SpO2 98%   BMI 28.59 kg/m  Wt Readings from Last 3 Encounters:  06/18/17 205 lb (93 kg)  05/05/17 201 lb 8 oz (91.4 kg)  03/24/17 198 lb (89.8 kg)    General: Appears her stated age, well developed, well nourished in NAD. Abdomen: Soft and nontender. Normal bowel sounds. No distention or masses noted.   BMET    Component Value Date/Time   NA 141 05/05/2017 1215   K 3.7 05/05/2017 1215   CL 105 05/05/2017 1215   CO2 30 05/05/2017 1215   GLUCOSE 91 05/05/2017 1215   BUN 9 05/05/2017 1215   CREATININE 0.90 05/05/2017 1215  CALCIUM 9.2 05/05/2017 1215    Lipid Panel     Component Value Date/Time   CHOL 97 05/05/2017 1215   TRIG 72.0 05/05/2017 1215   HDL 39.80 05/05/2017 1215   CHOLHDL 2 05/05/2017 1215   VLDL 14.4 05/05/2017 1215   LDLCALC 42 05/05/2017 1215    CBC    Component Value Date/Time   WBC 4.7 05/05/2017 1215   RBC 4.58 05/05/2017 1215   HGB 13.2 05/05/2017 1215   HCT 38.0 (L) 05/05/2017 1215   PLT 135.0 (L) 05/05/2017 1215   MCV 82.8 05/05/2017 1215   MCHC 34.7 05/05/2017 1215   RDW 16.1 (H) 05/05/2017 1215    Hgb A1C No results found for: HGBA1C         Assessment & Plan:   Viral Gastroenteritis:  Exam benign He is feeling better No intervention at this time Clear fluids and a bland diet for the next few days Work note provided  Return precautions discussed Nicki ReaperBAITY, Patrick Coats, NP

## 2017-07-14 ENCOUNTER — Emergency Department: Payer: 59

## 2017-07-14 ENCOUNTER — Emergency Department
Admission: EM | Admit: 2017-07-14 | Discharge: 2017-07-14 | Disposition: A | Discharge status: Home / Self Care | Payer: 59 | Attending: Emergency Medicine | Admitting: Emergency Medicine

## 2017-07-14 ENCOUNTER — Encounter: Payer: Self-pay | Admitting: Emergency Medicine

## 2017-07-14 ENCOUNTER — Other Ambulatory Visit: Payer: Self-pay

## 2017-07-14 DIAGNOSIS — W1849XA Other slipping, tripping and stumbling without falling, initial encounter: Secondary | ICD-10-CM | POA: Diagnosis not present

## 2017-07-14 DIAGNOSIS — Y939 Activity, unspecified: Secondary | ICD-10-CM | POA: Diagnosis not present

## 2017-07-14 DIAGNOSIS — S93602A Unspecified sprain of left foot, initial encounter: Secondary | ICD-10-CM | POA: Insufficient documentation

## 2017-07-14 DIAGNOSIS — Y999 Unspecified external cause status: Secondary | ICD-10-CM | POA: Diagnosis not present

## 2017-07-14 DIAGNOSIS — S99922A Unspecified injury of left foot, initial encounter: Secondary | ICD-10-CM | POA: Diagnosis present

## 2017-07-14 DIAGNOSIS — Z79899 Other long term (current) drug therapy: Secondary | ICD-10-CM | POA: Insufficient documentation

## 2017-07-14 DIAGNOSIS — Y92009 Unspecified place in unspecified non-institutional (private) residence as the place of occurrence of the external cause: Secondary | ICD-10-CM | POA: Diagnosis not present

## 2017-07-14 MED ORDER — MELOXICAM 15 MG PO TABS: 15.0000 mg | ORAL_TABLET | Freq: Every day | ORAL | 2 refills | Status: DC

## 2017-07-14 NOTE — ED Notes (Signed)
See triage note  States he tripped this am while going up steps  Having left foot pain  Increased pain with ambulation

## 2017-07-14 NOTE — Discharge Instructions (Signed)
Although with your regular doctor if you're not better in 5-7 days, keep the foot elevated and iced, use the medication as prescribed, use Ace wrap for extra support

## 2017-07-14 NOTE — ED Provider Notes (Signed)
[facility] Emergency Department Provider Note  ____________________________________________   First MD Initiated Contact with Patient 07/14/17 1529     (approximate)  I have reviewed the triage vital signs and the nursing notes.   HISTORY  Chief Complaint Foot Injury and Fall    HPI Patrick Hahn is a 36 y.o. male planes of left foot pain, states he tripped this morning at home, states it didn't hurt too bad at first but now it's really throbbing, increased pain when he bears weight, no numbness or tingling, no broken skin, no other injuries reported   History reviewed. No pertinent past medical history.  Patient Active Problem List   Diagnosis Date Noted  . Chronic back pain 05/05/2017    History reviewed. No pertinent surgical history.  Prior to Admission medications   Medication Sig Start Date End Date Taking? Authorizing Provider  LORazepam (ATIVAN) 0.5 MG tablet Take 1 tablet (0.5 mg total) by mouth daily as needed for anxiety. 03/24/17   Lorre MunroeBaity, [name] W, NP  meloxicam (MOBIC) 15 MG tablet Take 1 tablet (15 mg total) by mouth daily. 07/14/17 07/14/18  Sherrie MustacheFisher, Roselyn BeringSusan W, PA-C  triamcinolone cream (KENALOG) 0.1 % Apply 1 application topically 2 (two) times daily. 03/24/17   Lorre MunroeBaity, [name] W, NP    Allergies Patient has no known allergies.  Family History  Problem Relation Age of Onset  . Cancer Maternal Uncle        throat  . Diabetes Neg Hx   . Early death Neg Hx   . Heart disease Neg Hx   . Stroke Neg Hx     Social History Social History   Tobacco Use  . Smoking status: Never Smoker  . Smokeless tobacco: Never Used  Substance Use Topics  . Alcohol use: Yes    Alcohol/week: 0.6 oz    Types: 1 Shots of liquor per week    Comment: occasional  . Drug use: No    Review of Systems  Constitutional: No fever/chills Eyes: No visual changes. ENT: No sore throat. Respiratory: Denies cough Genitourinary: Negative for  dysuria. Musculoskeletal: Negative for back pain. Positive left foot pain Skin: Negative for rash.    ____________________________________________   PHYSICAL EXAM:  VITAL SIGNS: ED Triage Vitals  Enc Vitals Group     BP 07/14/17 1514 131/90     Pulse Rate 07/14/17 1514 90     Resp 07/14/17 1514 18     Temp --      Temp src --      SpO2 07/14/17 1514 99 %     Weight 07/14/17 1515 200 lb (90.7 kg)     Height 07/14/17 1515 5\' 11"  (1.803 m)     Head Circumference --      Peak Flow --      Pain Score 07/14/17 1514 8     Pain Loc --      Pain Edu? --      Excl. in GC? --     Constitutional: Alert and oriented. Well appearing and in no acute distress. Eyes: Conjunctivae are normal.  Head: Atraumatic. Nose: No congestion/rhinnorhea. Mouth/Throat: Mucous membranes are moist.   Cardiovascular: Normal rate, regular rhythm. Heart sounds are normal Respiratory: Normal respiratory effort.  No retractions, clear to auscultation GU: deferred Musculoskeletal: FROM all extremities, warm and well perfused, foot is tender across the distal metatarsals and across the cuboid area, neurovascular is intact Neurologic:  Normal speech and language.  Skin:  Skin is warm,  dry and intact. No rash noted. Psychiatric: Mood and affect are normal. Speech and behavior are normal.  ____________________________________________   LABS (all labs ordered are listed, but only abnormal results are displayed)  Labs Reviewed - No data to display ____________________________________________   ____________________________________________  RADIOLOGY  X-ray of the left foot is negative  ____________________________________________   PROCEDURES  Procedure(s) performed: No      ____________________________________________   INITIAL IMPRESSION / ASSESSMENT AND PLAN / ED COURSE  Pertinent labs & imaging results that were available during my care of the patient were reviewed by me and  considered in my medical decision making (see chart for details).  Patrick Hahn is a 36 year old male with a sprained left foot, Ace wrap was applied, prescription for meloxicam 15 mg daily was given, patient was instructed to keep the foot elevated and iced he was given a work note, he is to follow-up with his regular doctor if he is not better in 5-7 days, patient states he understands and will follow his treatment plan      ____________________________________________   FINAL CLINICAL IMPRESSION(S) / ED DIAGNOSES  Final diagnoses:  Foot sprain, left, initial encounter      NEW MEDICATIONS STARTED DURING THIS VISIT:  This SmartLink is deprecated. Use AVSMEDLIST instead to display the medication list for a patient.   Note:  This document was prepared using Dragon voice recognition software and may include unintentional dictation errors.    Faythe GheeFisher, Olsen Mccutchan W, PA-C 07/14/17 1611    Jene EveryKinner, Robert, MD 07/16/17 [id number]

## 2017-07-14 NOTE — ED Triage Notes (Signed)
Pt reportss tripped going up steps this morning and injured left foot. Pt limping in triage.

## 2017-07-17 ENCOUNTER — Encounter: Payer: Self-pay | Admitting: Internal Medicine

## 2017-07-17 ENCOUNTER — Ambulatory Visit (INDEPENDENT_AMBULATORY_CARE_PROVIDER_SITE_OTHER): Payer: 59 | Admitting: Internal Medicine

## 2017-07-17 VITALS — BP 122/76 | HR 87 | Temp 97.9°F | Wt 201.0 lb

## 2017-07-17 DIAGNOSIS — S93401D Sprain of unspecified ligament of right ankle, subsequent encounter: Secondary | ICD-10-CM | POA: Diagnosis not present

## 2017-07-22 ENCOUNTER — Encounter: Payer: Self-pay | Admitting: Internal Medicine

## 2017-07-22 NOTE — Progress Notes (Signed)
Subjective:    Patient ID: Patrick Hahn, male    DOB: September 24, 1980, 36 y.o.   MRN: [id number]  HPI  Pt presents to the clinic today for ER followup. He went to th ER 12/3 with c/o right ankle pain. Xray was negative for acute fracture. He was diagnosed with an ankle sprain. He was given a RX for Meloxicam which he reports he never picked up. He has been taking Ibuprofen instead. He still has mild pain with ambulation but denies bruising or swelling. He has been out of work since that time and is requesting a work note today.   Review of Systems  No past medical history on file.  Current Outpatient Medications  Medication Sig Dispense Refill  . LORazepam (ATIVAN) 0.5 MG tablet Take 1 tablet (0.5 mg total) by mouth daily as needed for anxiety. 10 tablet 0  . meloxicam (MOBIC) 15 MG tablet Take 1 tablet (15 mg total) by mouth daily. 30 tablet 2  . triamcinolone cream (KENALOG) 0.1 % Apply 1 application topically 2 (two) times daily. 45 g 1   No current facility-administered medications for this visit.     No Known Allergies  Family History  Problem Relation Age of Onset  . Cancer Maternal Uncle        throat  . Diabetes Neg Hx   . Early death Neg Hx   . Heart disease Neg Hx   . Stroke Neg Hx     Social History   Socioeconomic History  . Marital status: Married    Spouse name: Not on file  . Number of children: Not on file  . Years of education: Not on file  . Highest education level: Not on file  Social Needs  . Financial resource strain: Not on file  . Food insecurity - worry: Not on file  . Food insecurity - inability: Not on file  . Transportation needs - medical: Not on file  . Transportation needs - non-medical: Not on file  Occupational History  . Not on file  Tobacco Use  . Smoking status: Never Smoker  . Smokeless tobacco: Never Used  Substance and Sexual Activity  . Alcohol use: Yes    Alcohol/week: 0.6 oz    Types: 1 Shots of liquor per week    Comment:  occasional  . Drug use: No  . Sexual activity: Yes  Other Topics Concern  . Not on file  Social History Narrative  . Not on file     Constitutional: Denies fever, malaise, fatigue, headache or abrupt weight changes.  Musculoskeletal: Pt reports right ankle pain. Denies decrease in range of motion, difficulty with gait, muscle pain or joint swelling.  Skin: Denies redness, rashes, lesions or ulcercations.    No other specific complaints in a complete review of systems (except as listed in HPI above).     Objective:   Physical Exam    BP 122/76   Pulse 87   Temp 97.9 F (36.6 C) (Oral)   Wt 201 lb (91.2 kg)   SpO2 97%   BMI 28.03 kg/m  Wt Readings from Last 3 Encounters:  07/17/17 201 lb (91.2 kg)  07/14/17 200 lb (90.7 kg)  06/18/17 205 lb (93 kg)    General: Appears his stated age, in NAD. Skin: Warm, dry and intact. No bruising noted. Musculoskeletal: Normal flexion, extension and rotation of the right ankle. Pain posterior to the lateral malleolus. No joint swelling noted. No difficulty with gait.  BMET  Component Value Date/Time   NA 141 05/05/2017 1215   K 3.7 05/05/2017 1215   CL 105 05/05/2017 1215   CO2 30 05/05/2017 1215   GLUCOSE 91 05/05/2017 1215   BUN 9 05/05/2017 1215   CREATININE 0.90 05/05/2017 1215   CALCIUM 9.2 05/05/2017 1215    Lipid Panel     Component Value Date/Time   CHOL 97 05/05/2017 1215   TRIG 72.0 05/05/2017 1215   HDL 39.80 05/05/2017 1215   CHOLHDL 2 05/05/2017 1215   VLDL 14.4 05/05/2017 1215   LDLCALC 42 05/05/2017 1215    CBC    Component Value Date/Time   WBC 4.7 05/05/2017 1215   RBC 4.58 05/05/2017 1215   HGB 13.2 05/05/2017 1215   HCT 38.0 (L) 05/05/2017 1215   PLT 135.0 (L) 05/05/2017 1215   MCV 82.8 05/05/2017 1215   MCHC 34.7 05/05/2017 1215   RDW 16.1 (H) 05/05/2017 1215    Hgb A1C No results found for: HGBA1C        Assessment & Plan:   ER Follow Up for Right Ankle Sprain:  ER notes  and imaging reviewed Advised pt to continue Ibuprofen prn Information given on RICE He does not need crutches or PT aty this time Work note provided  Return  precautions discussed Nicki ReaperBAITY, [name], NP

## 2017-07-22 NOTE — Patient Instructions (Signed)
Ankle Sprain  An ankle sprain is a stretch or tear in one of the tough tissues (ligaments) in your ankle.  Follow these instructions at home:   Rest your ankle.   Take over-the-counter and prescription medicines only as told by your doctor.   For 2-3 days, keep your ankle higher than the level of your heart (elevated) as much as possible.   If directed, put ice on the area:  ? Put ice in a plastic bag.  ? Place a towel between your skin and the bag.  ? Leave the ice on for 20 minutes, 2-3 times a day.   If you were given a brace:  ? Wear it as told.  ? Take it off to shower or bathe.  ? Try not to move your ankle much, but wiggle your toes from time to time. This helps to prevent swelling.   If you were given an elastic bandage (dressing):  ? Take it off when you shower or bathe.  ? Try not to move your ankle much, but wiggle your toes from time to time. This helps to prevent swelling.  ? Adjust the bandage to make it more comfortable if it feels too tight.  ? Loosen the bandage if you lose feeling in your foot, your foot tingles, or your foot gets cold and blue.   If you have crutches, use them as told by your doctor. Continue to use them until you can walk without feeling pain in your ankle.  Contact a doctor if:   Your bruises or swelling are quickly getting worse.   Your pain does not get better after you take medicine.  Get help right away if:   You cannot feel your toes or foot.   Your toes or your foot looks blue.   You have very bad pain that gets worse.  This information is not intended to replace advice given to you by your health care provider. Make sure you discuss any questions you have with your health care provider.  Document Released: 01/15/2008 Document Revised: 01/04/2016 Document Reviewed: 02/28/2015  Elsevier Interactive Patient Education  2018 [employer]

## 2017-10-22 ENCOUNTER — Other Ambulatory Visit: Payer: Self-pay

## 2017-10-22 ENCOUNTER — Encounter: Payer: Self-pay | Admitting: Family Medicine

## 2017-10-22 ENCOUNTER — Ambulatory Visit (INDEPENDENT_AMBULATORY_CARE_PROVIDER_SITE_OTHER): Payer: 59 | Admitting: Family Medicine

## 2017-10-22 VITALS — BP 90/60 | HR 96 | Temp 98.4°F | Ht 71.0 in | Wt 209.5 lb

## 2017-10-22 DIAGNOSIS — M545 Low back pain, unspecified: Secondary | ICD-10-CM

## 2017-10-22 MED ORDER — MELOXICAM 15 MG PO TABS: 15.0000 mg | ORAL_TABLET | Freq: Every day | ORAL | 2 refills | Status: AC

## 2017-10-22 MED ORDER — CYCLOBENZAPRINE HCL 10 MG PO TABS: 10.0000 mg | ORAL_TABLET | Freq: Three times a day (TID) | ORAL | 1 refills | Status: DC | PRN

## 2017-10-22 NOTE — Progress Notes (Signed)
Dr. Karleen HampshireSpencer T. Tocarra Gassen, MD, CAQ [facility] [address] [place], [address] Phone: [phone] Fax: [phone]  10/22/2017  Patient: Patrick KaysClarence Hahn, MRN: [id number], DOB: Apr 16, 1981, 37 y.o.  Primary Physician:  Lorre MunroeBaity, [name] W, NP   Chief Complaint  Patient presents with  . Back Pain   Subjective:   Patrick Hahn is a 37 y.o. very pleasant male patient who presents with the following: Back Pain  ongoing for approximately: 2-3 d The patient has had back pain before. The back pain is localized into the lumbar spine area. They also describe no radiculopathy.  No numbness or tingling. No bowel or bladder incontinence. No focal weakness. Prior interventions: 0 Physical therapy: No Chiropractic manipulations: No Acupuncture: No Osteopathic manipulation: No Heat or cold: Minimal effect  Past Medical History, Surgical History, Family History, Medications, Allergies have been reviewed and updated if relevant.  Patient Active Problem List   Diagnosis Date Noted  . Chronic back pain 05/05/2017    History reviewed. No pertinent past medical history.  History reviewed. No pertinent surgical history.  Social History   Socioeconomic History  . Marital status: Married    Spouse name: Not on file  . Number of children: Not on file  . Years of education: Not on file  . Highest education level: Not on file  Social Needs  . Financial resource strain: Not on file  . Food insecurity - worry: Not on file  . Food insecurity - inability: Not on file  . Transportation needs - medical: Not on file  . Transportation needs - non-medical: Not on file  Occupational History  . Not on file  Tobacco Use  . Smoking status: Never Smoker  . Smokeless tobacco: Never Used  Substance and Sexual Activity  . Alcohol use: Yes    Alcohol/week: 0.6 oz    Types: 1 Shots of liquor per week    Comment: occasional  . Drug use: No  . Sexual activity:  Yes  Other Topics Concern  . Not on file  Social History Narrative  . Not on file    Family History  Problem Relation Age of Onset  . Cancer Maternal Uncle        throat  . Diabetes Neg Hx   . Early death Neg Hx   . Heart disease Neg Hx   . Stroke Neg Hx     No Known Allergies  Medication list reviewed and updated in full in [facility].  GEN: No fevers, chills. Nontoxic. Primarily MSK c/o today. MSK: Detailed in the HPI GI: tolerating PO intake without difficulty Neuro: As above  Otherwise the pertinent positives of the ROS are noted above.    Objective:   Blood pressure 90/60, pulse 96, temperature 98.4 F (36.9 C), temperature source Oral, height 5\' 11"  (1.803 m), weight 209 lb 8 oz (95 kg).  Gen: Well-developed,well-nourished,in no acute distress; alert,appropriate and cooperative throughout examination HEENT: Normocephalic and atraumatic without obvious abnormalities.  Ears, externally no deformities Pulm: Breathing comfortably in no respiratory distress Range of motion at  the waist: Flexion, rotation and lateral bending: full  No echymosis or edema Rises to examination table with no difficulty Gait: minimally antalgic  Inspection/Deformity: No abnormality Paraspinus T:  l4-5  B Ankle Dorsiflexion (L5,4): 5/5 B Great Toe Dorsiflexion (L5,4): 5/5 Heel Walk (L5): WNL Toe Walk (S1): WNL Rise/Squat (L4): WNL, mild pain  SENSORY B Medial Foot (L4): WNL B Dorsum (L5): WNL  B Lateral (S1): WNL Light Touch: WNL Pinprick: WNL  REFLEXES Knee (L4): 2+ Ankle (S1): 2+  B SLR, seated: neg B SLR, supine: neg B FABER: neg B Reverse FABER: neg B Greater Troch: NT B Log Roll: neg B Stork: NT B Sciatic Notch: NT  Radiology: No results found.  Assessment and Plan:   Acute bilateral low back pain without sciatica  Anatomy reviewed. Conservative algorithms for acute back pain generally begin with the following: NSAIDS, Muscle Relaxants, Mild pain  medication  Start with medications, core rehab, and progress from there following low back pain algorithm. No red flags are present.  Follow-up: No Follow-up on file.  Meds ordered this encounter  Medications  . meloxicam (MOBIC) 15 MG tablet    Sig: Take 1 tablet (15 mg total) by mouth daily.    Dispense:  30 tablet    Refill:  2  . cyclobenzaprine (FLEXERIL) 10 MG tablet    Sig: Take 1 tablet (10 mg total) by mouth 3 (three) times daily as needed for muscle spasms.    Dispense:  30 tablet    Refill:  1    Signed,  Jinx Gilden T. Leslee Haueter, MD   Allergies as of 10/22/2017   No Known Allergies     Medication List        Accurate as of 10/22/17 11:59 PM. Always use your most recent med list.          cyclobenzaprine 10 MG tablet Commonly known as:  FLEXERIL Take 1 tablet (10 mg total) by mouth 3 (three) times daily as needed for muscle spasms.   LORazepam 0.5 MG tablet Commonly known as:  ATIVAN Take 1 tablet (0.5 mg total) by mouth daily as needed for anxiety.   meloxicam 15 MG tablet Commonly known as:  MOBIC Take 1 tablet (15 mg total) by mouth daily.   triamcinolone cream 0.1 % Commonly known as:  KENALOG Apply 1 application topically 2 (two) times daily.

## 2018-03-11 ENCOUNTER — Ambulatory Visit (INDEPENDENT_AMBULATORY_CARE_PROVIDER_SITE_OTHER): Payer: 59 | Admitting: Internal Medicine

## 2018-03-11 ENCOUNTER — Encounter: Payer: Self-pay | Admitting: Internal Medicine

## 2018-03-11 VITALS — BP 118/70 | HR 99 | Temp 98.2°F | Wt 228.0 lb

## 2018-03-11 DIAGNOSIS — G8929 Other chronic pain: Secondary | ICD-10-CM | POA: Diagnosis not present

## 2018-03-11 DIAGNOSIS — M545 Low back pain: Secondary | ICD-10-CM

## 2018-03-11 DIAGNOSIS — M549 Dorsalgia, unspecified: Secondary | ICD-10-CM

## 2018-03-11 NOTE — Patient Instructions (Signed)

## 2018-03-11 NOTE — Progress Notes (Signed)
Subjective:    Patient ID: Patrick Hahn, male    DOB: [birth year], 37 y.o.   MRN: [id number]  HPI  Pt presents to the clinic today with c/o upper and lower back pain. He reports the upper back pain started 3-4 days ago. He describes the pain as tight and sore. The pain is worse with movement. The pain does not radiate. He denies any injury to the area. He has tried Ibuprofen and Flexeril with some relief.  He also reports that his low back pain has started to flare back up. This started bothering him yesterday. He denies recurrent injury to the area. Xray of the lumbar spine from 10/2015 showed:  IMPRESSION: Straightening of the lower thoracic and lumbar spine. Mild disc space narrowing T11-12 through L1-2. Moderate L4-5 disc space narrowing.  He takes Flexeril and Ibuprofen as needed for low back pain. He reports his FMLA has expired for his back pain and he would like to get this renewed today.  Review of Systems      No past medical history on file.  Current Outpatient Medications  Medication Sig Dispense Refill  . cyclobenzaprine (FLEXERIL) 10 MG tablet Take 1 tablet (10 mg total) by mouth 3 (three) times daily as needed for muscle spasms. 30 tablet 1  . LORazepam (ATIVAN) 0.5 MG tablet Take 1 tablet (0.5 mg total) by mouth daily as needed for anxiety. 10 tablet 0   No current facility-administered medications for this visit.     No Known Allergies  Family History  Problem Relation Age of Onset  . Cancer Maternal Uncle        throat  . Diabetes Neg Hx   . Early death Neg Hx   . Heart disease Neg Hx   . Stroke Neg Hx     Social History   Socioeconomic History  . Marital status: Married    Spouse name: Not on file  . Number of children: Not on file  . Years of education: Not on file  . Highest education level: Not on file  Occupational History  . Not on file  Social Needs  . Financial resource strain: Not on file  . Food insecurity:    Worry: Not on file   Inability: Not on file  . Transportation needs:    Medical: Not on file    Non-medical: Not on file  Tobacco Use  . Smoking status: Never Smoker  . Smokeless tobacco: Never Used  Substance and Sexual Activity  . Alcohol use: Yes    Alcohol/week: 0.6 oz    Types: 1 Shots of liquor per week    Comment: occasional  . Drug use: No  . Sexual activity: Yes  Lifestyle  . Physical activity:    Days per week: Not on file    Minutes per session: Not on file  . Stress: Not on file  Relationships  . Social connections:    Talks on phone: Not on file    Gets together: Not on file    Attends religious service: Not on file    Active member of club or organization: Not on file    Attends meetings of clubs or organizations: Not on file    Relationship status: Not on file  . Intimate partner violence:    Fear of current or ex partner: Not on file    Emotionally abused: Not on file    Physically abused: Not on file    Forced sexual activity: Not on file  Other Topics Concern  . Not on file  Social History Narrative  . Not on file     Constitutional: Denies fever, malaise, fatigue, headache or abrupt weight changes.  Respiratory: Denies difficulty breathing, shortness of breath, cough or sputum production.   Cardiovascular: Denies chest pain, chest tightness, palpitations or swelling in the hands or feet.  Gastrointestinal: Denies abdominal pain, bloating, constipation, diarrhea or blood in the stool.  GU: Denies urgency, frequency, pain with urination, burning sensation, blood in urine, odor or discharge. Musculoskeletal: Pt reports back pain. Denies decrease in range of motion, difficulty with gait, or joint swelling.   No other specific complaints in a complete review of systems (except as listed in HPI above).  Objective:   Physical Exam  BP 118/70   Pulse 99   Temp 98.2 F (36.8 C) (Oral)   Wt 228 lb (103.4 kg)   SpO2 98%   BMI 31.80 kg/m  Wt Readings from Last 3  Encounters:  03/11/18 228 lb (103.4 kg)  10/22/17 209 lb 8 oz (95 kg)  07/17/17 201 lb (91.2 kg)    General: Appears his stated age, well developed, well nourished in NAD. Musculoskeletal: Normal flexion, extension and rotation of the spine. No bony tenderness noted over the spine. Pain with palpation of the right subscapular region. Strength 5/5 BUE/BLE. No difficulty with gait. Neurological: Alert and oriented.    BMET    Component Value Date/Time   NA 141 05/05/2017 1215   K 3.7 05/05/2017 1215   CL 105 05/05/2017 1215   CO2 30 05/05/2017 1215   GLUCOSE 91 05/05/2017 1215   BUN 9 05/05/2017 1215   CREATININE 0.90 05/05/2017 1215   CALCIUM 9.2 05/05/2017 1215    Lipid Panel     Component Value Date/Time   CHOL 97 05/05/2017 1215   TRIG 72.0 05/05/2017 1215   HDL 39.80 05/05/2017 1215   CHOLHDL 2 05/05/2017 1215   VLDL 14.4 05/05/2017 1215   LDLCALC 42 05/05/2017 1215    CBC    Component Value Date/Time   WBC 4.7 05/05/2017 1215   RBC 4.58 05/05/2017 1215   HGB 13.2 05/05/2017 1215   HCT 38.0 (L) 05/05/2017 1215   PLT 135.0 (L) 05/05/2017 1215   MCV 82.8 05/05/2017 1215   MCHC 34.7 05/05/2017 1215   RDW 16.1 (H) 05/05/2017 1215    Hgb A1C No results found for: HGBA1C          Assessment & Plan:   Right Upper Back Pain:  Likely just muscular Encouraged stretching, heat and massage Continue Ibuprofen and Flexeril as needed  Chronic Low Back Pain:  Encouraged stretching, heat and massage when this flares Can take Ibuprofen and Flexeril as needed Will fill out FMLA forms for 1 episode per month, 2 days per episode  Return precautions discussed Nicki Reaper, NP

## 2018-03-12 ENCOUNTER — Telehealth: Payer: Self-pay | Admitting: Internal Medicine

## 2018-03-12 NOTE — Telephone Encounter (Signed)
Copy for pt  Copy for scan Pt will need to turn in paperwork

## 2018-03-12 NOTE — Telephone Encounter (Signed)
fmla paperwork in regina's in box °

## 2018-03-12 NOTE — Telephone Encounter (Signed)
Done, given back to Robin 

## 2018-03-12 NOTE — Telephone Encounter (Signed)
Left message asking pt to call office  °

## 2018-04-04 IMAGING — DX DG FOOT COMPLETE 3+V*L*
3 series · 3 of 3 positions shown · non-contrast
Comparison: None.

CLINICAL DATA: Left foot pain, injury, swelling

EXAM:
LEFT FOOT - COMPLETE 3+ VIEW

[foot ap]
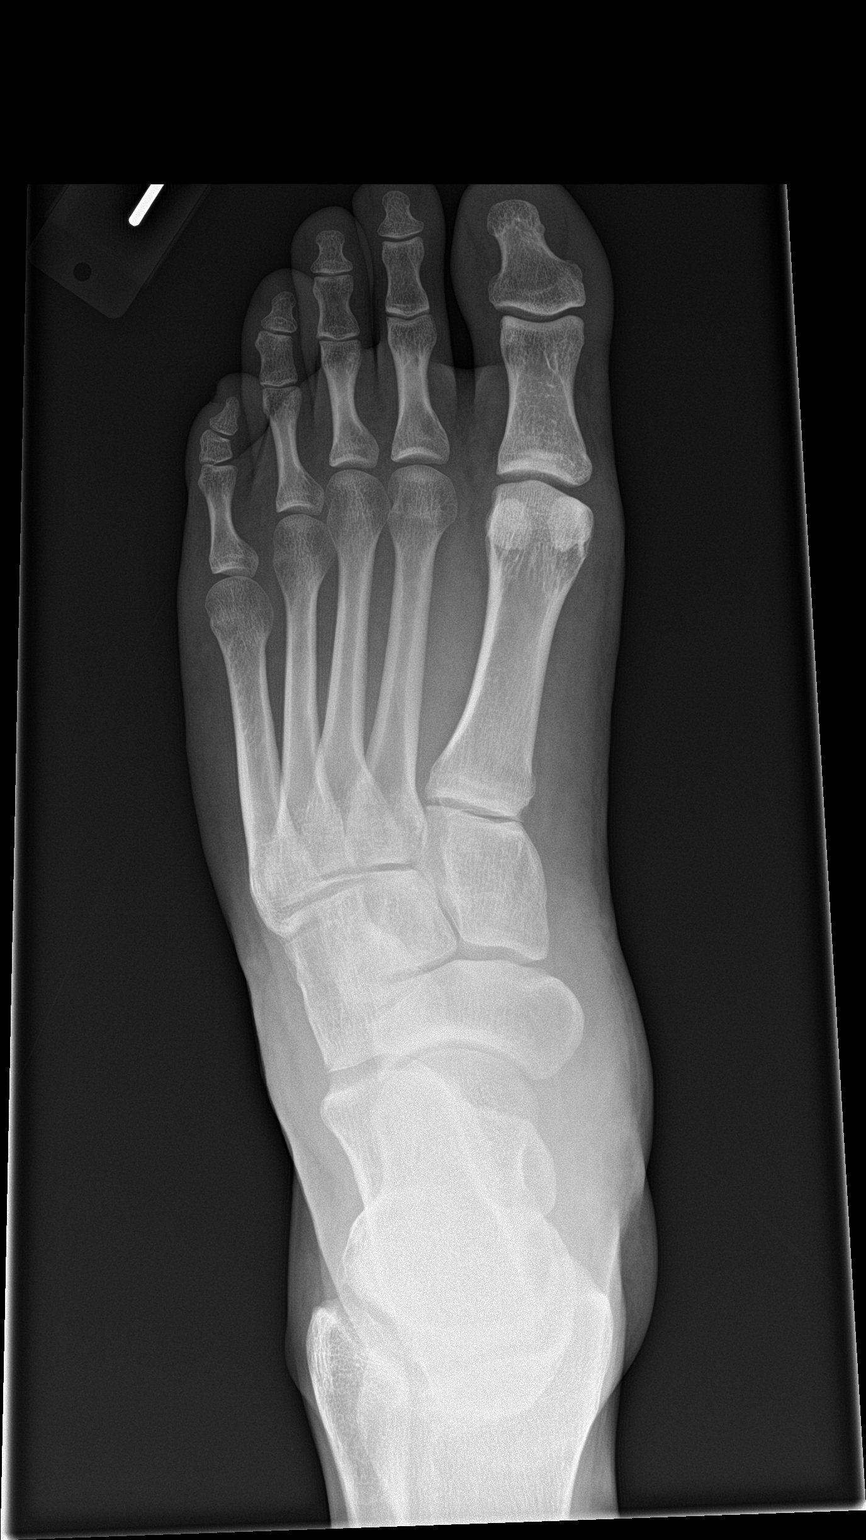

[foot obl]
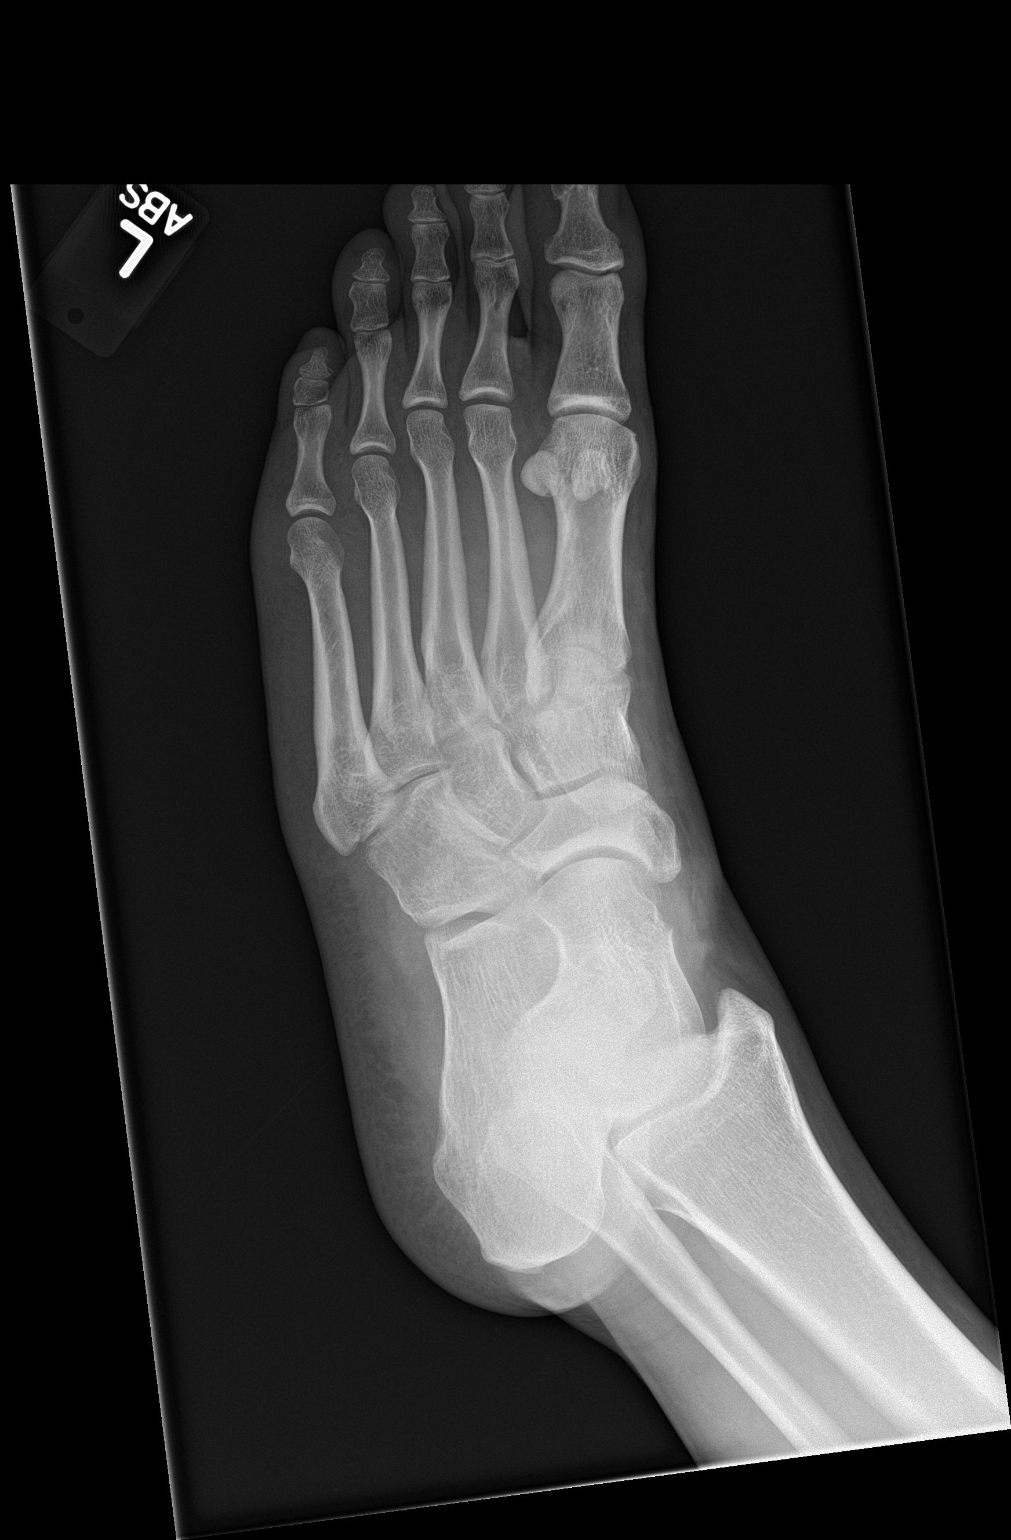

[foot lat]
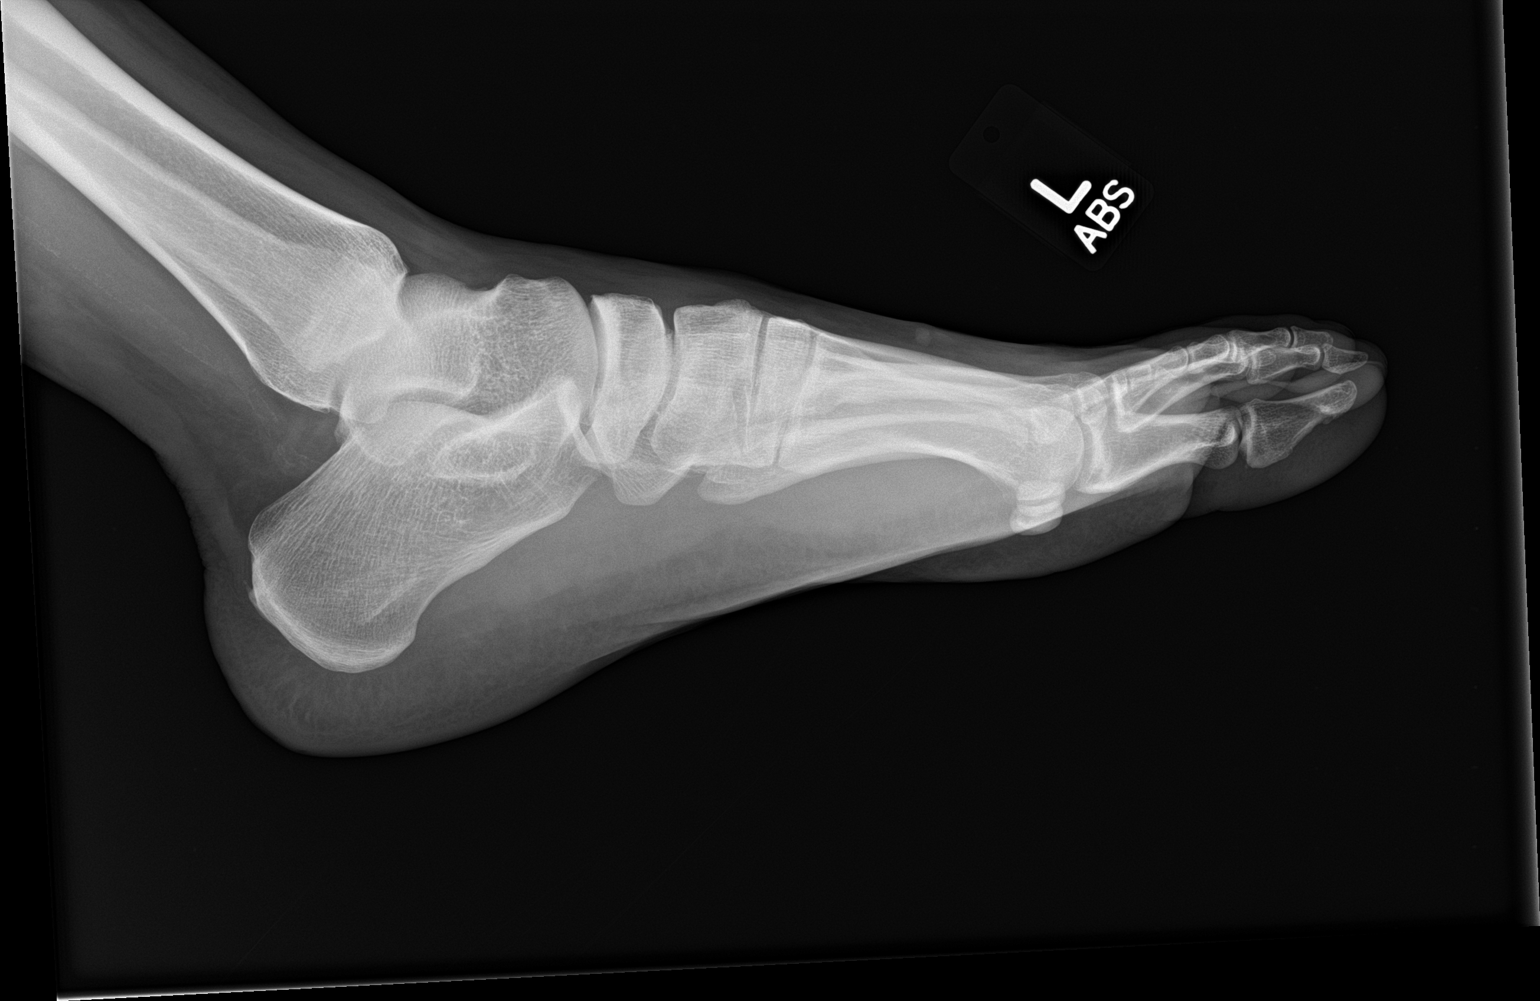

[3 of 3 positions shown; findings below may reference images not displayed]

FINDINGS: There is no evidence of fracture or dislocation. There is no
evidence of arthropathy or other focal bone abnormality. Soft
tissues are unremarkable.
IMPRESSION: No acute osseous injury of the left foot.

## 2018-05-28 ENCOUNTER — Ambulatory Visit (INDEPENDENT_AMBULATORY_CARE_PROVIDER_SITE_OTHER): Payer: 59 | Admitting: Internal Medicine

## 2018-05-28 ENCOUNTER — Encounter: Payer: Self-pay | Admitting: Internal Medicine

## 2018-05-28 VITALS — BP 116/78 | HR 80 | Temp 98.0°F | Ht 71.0 in | Wt 230.0 lb

## 2018-05-28 DIAGNOSIS — M545 Low back pain: Secondary | ICD-10-CM

## 2018-05-28 DIAGNOSIS — L308 Other specified dermatitis: Secondary | ICD-10-CM

## 2018-05-28 DIAGNOSIS — G8929 Other chronic pain: Secondary | ICD-10-CM

## 2018-05-28 DIAGNOSIS — Z0000 Encounter for general adult medical examination without abnormal findings: Secondary | ICD-10-CM | POA: Diagnosis not present

## 2018-05-28 DIAGNOSIS — R7989 Other specified abnormal findings of blood chemistry: Secondary | ICD-10-CM | POA: Diagnosis not present

## 2018-05-28 DIAGNOSIS — L739 Follicular disorder, unspecified: Secondary | ICD-10-CM

## 2018-05-28 LAB — LIPID PANEL
CHOL/HDL RATIO: 3
Cholesterol: 95 mg/dL (ref 0–200)
HDL: 33.2 mg/dL — ABNORMAL LOW (ref >39.00)
LDL CALC: 49 mg/dL (ref 0–99)
NONHDL: 61.68
TRIGLYCERIDES: 62 mg/dL (ref 0.0–149.0)
VLDL: 12.4 mg/dL (ref 0.0–40.0)

## 2018-05-28 LAB — COMPREHENSIVE METABOLIC PANEL
ALBUMIN: 4.1 g/dL (ref 3.5–5.2)
ALT: 22 U/L (ref 0–53)
AST: 19 U/L (ref 0–37)
Alkaline Phosphatase: 62 U/L (ref 39–117)
BUN: 12 mg/dL (ref 6–23)
CALCIUM: 8.8 mg/dL (ref 8.4–10.5)
CHLORIDE: 105 meq/L (ref 96–112)
CO2: 28 meq/L (ref 19–32)
CREATININE: 1.01 mg/dL (ref 0.40–1.50)
GFR: 107.31 mL/min (ref >60.00)
GLUCOSE: 106 mg/dL — AB (ref 70–99)
Potassium: 3.5 mEq/L (ref 3.5–5.1)
SODIUM: 140 meq/L (ref 135–145)
Total Bilirubin: 1.3 mg/dL — ABNORMAL HIGH (ref 0.2–1.2)
Total Protein: 7.4 g/dL (ref 6.0–8.3)

## 2018-05-28 LAB — CBC
HCT: 34.6 % — ABNORMAL LOW (ref 39.0–52.0)
Hemoglobin: 11.9 g/dL — ABNORMAL LOW (ref 13.0–17.0)
MCHC: 34.5 g/dL (ref 30.0–36.0)
MCV: 82.2 fl (ref 78.0–100.0)
Platelets: 165 10*3/uL (ref 150.0–400.0)
RBC: 4.21 Mil/uL — AB (ref 4.22–5.81)
RDW: 16.5 % — ABNORMAL HIGH (ref 11.5–15.5)
WBC: 4.7 10*3/uL (ref 4.0–10.5)

## 2018-05-28 MED ORDER — TRIAMCINOLONE ACETONIDE 0.1 % EX CREA: 1.0000 "application " | TOPICAL_CREAM | Freq: Two times a day (BID) | CUTANEOUS | 0 refills | Status: DC

## 2018-05-28 MED ORDER — MUPIROCIN 2 % EX OINT: 1.0000 "application " | TOPICAL_OINTMENT | Freq: Two times a day (BID) | CUTANEOUS | 0 refills | Status: DC

## 2018-05-28 MED ORDER — CYCLOBENZAPRINE HCL 10 MG PO TABS: 10.0000 mg | ORAL_TABLET | Freq: Three times a day (TID) | ORAL | 0 refills | Status: DC | PRN

## 2018-05-28 NOTE — Assessment & Plan Note (Signed)
Encouraged routine stretching and core strengthening Continue Ibuprofen and Flexeril prn Flexeril refilled today

## 2018-05-28 NOTE — Patient Instructions (Signed)

## 2018-05-28 NOTE — Progress Notes (Signed)
Subjective:    Patient ID: Patrick Hahn, male    DOB: [birth year], 37 y.o.   MRN: [id number]  HPI  Pt presents to the clinic today for his annual exam.  Chronic Back Pain: Flares intermittently. He takes Ibuprofen and Flexeril as needed with good relief. He would like a refill of flexeril today.  Flu: never Tetanus: 02/2016 Dentist: annually  Diet: He does eat meat. He consumes more veggies than fruits. He does eat some fried foods. He drinks mostly water, juice and soda. Exercise: None  Review of Systems      No past medical history on file.  Current Outpatient Medications  Medication Sig Dispense Refill  . cyclobenzaprine (FLEXERIL) 10 MG tablet Take 1 tablet (10 mg total) by mouth 3 (three) times daily as needed for muscle spasms. 30 tablet 1   No current facility-administered medications for this visit.     No Known Allergies  Family History  Problem Relation Age of Onset  . Cancer Maternal Uncle        throat  . Diabetes Neg Hx   . Early death Neg Hx   . Heart disease Neg Hx   . Stroke Neg Hx     Social History   Socioeconomic History  . Marital status: Married    Spouse name: Not on file  . Number of children: Not on file  . Years of education: Not on file  . Highest education level: Not on file  Occupational History  . Not on file  Social Needs  . Financial resource strain: Not on file  . Food insecurity:    Worry: Not on file    Inability: Not on file  . Transportation needs:    Medical: Not on file    Non-medical: Not on file  Tobacco Use  . Smoking status: Never Smoker  . Smokeless tobacco: Never Used  Substance and Sexual Activity  . Alcohol use: Yes    Alcohol/week: 1.0 standard drinks    Types: 1 Shots of liquor per week    Comment: occasional  . Drug use: No  . Sexual activity: Yes  Lifestyle  . Physical activity:    Days per week: Not on file    Minutes per session: Not on file  . Stress: Not on file  Relationships  . Social  connections:    Talks on phone: Not on file    Gets together: Not on file    Attends religious service: Not on file    Active member of club or organization: Not on file    Attends meetings of clubs or organizations: Not on file    Relationship status: Not on file  . Intimate partner violence:    Fear of current or ex partner: Not on file    Emotionally abused: Not on file    Physically abused: Not on file    Forced sexual activity: Not on file  Other Topics Concern  . Not on file  Social History Narrative  . Not on file     Constitutional: Denies fever, malaise, fatigue, headache or abrupt weight changes.  HEENT: Denies eye pain, eye redness, ear pain, ringing in the ears, wax buildup, runny nose, nasal congestion, bloody nose, or sore throat. Respiratory: Denies difficulty breathing, shortness of breath, cough or sputum production.   Cardiovascular: Denies chest pain, chest tightness, palpitations or swelling in the hands or feet.  Gastrointestinal: Denies abdominal pain, bloating, constipation, diarrhea or blood in the stool.  GU:  Denies urgency, frequency, pain with urination, burning sensation, blood in urine, odor or discharge. Musculoskeletal: Denies decrease in range of motion, difficulty with gait, muscle pain or joint pain and swelling.  Skin: Pt reports intermittent eczema, folliculitis of scalp. Denies redness, rashes, lesions or ulcercations.  Neurological: Denies dizziness, difficulty with memory, difficulty with speech or problems with balance and coordination.  Psych: Denies anxiety, depression, SI/HI.  No other specific complaints in a complete review of systems (except as listed in HPI above).  Objective:   Physical Exam   BP 116/78   Pulse 80   Temp 98 F (36.7 C) (Oral)   Ht 5\' 11"  (1.803 m)   Wt 230 lb (104.3 kg)   SpO2 98%   BMI 32.08 kg/m  Wt Readings from Last 3 Encounters:  05/28/18 230 lb (104.3 kg)  03/11/18 228 lb (103.4 kg)  10/22/17 209 lb  8 oz (95 kg)    General: Appears his stated age, obese, in NAD. Skin: Warm, dry and intact.  HEENT: Head: normal shape and size; Eyes: sclera white, no icterus, conjunctiva pink, PERRLA and EOMs intact; Ears: Tm's gray and intact, normal light reflex; Throat/Mouth: Teeth present, mucosa pink and moist, no exudate, lesions or ulcerations noted.  Neck:  Neck supple, trachea midline. No masses, lumps or thyromegaly present.  Cardiovascular: Normal rate and rhythm. S1,S2 noted.  No murmur, rubs or gallops noted. No JVD or BLE edema.  Pulmonary/Chest: Normal effort and positive vesicular breath sounds. No respiratory distress. No wheezes, rales or ronchi noted.  Abdomen: Soft and nontender. Normal bowel sounds. No distention or masses noted. Liver, spleen and kidneys non palpable. Musculoskeletal: Strength 5/5 BUE/BLE. No difficulty with gait.  Neurological: Alert and oriented. Cranial nerves II-XII grossly intact. Coordination normal.  Psychiatric: Mood and affect normal. Behavior is normal. Judgment and thought content normal.     BMET    Component Value Date/Time   NA 141 05/05/2017 1215   K 3.7 05/05/2017 1215   CL 105 05/05/2017 1215   CO2 30 05/05/2017 1215   GLUCOSE 91 05/05/2017 1215   BUN 9 05/05/2017 1215   CREATININE 0.90 05/05/2017 1215   CALCIUM 9.2 05/05/2017 1215    Lipid Panel     Component Value Date/Time   CHOL 97 05/05/2017 1215   TRIG 72.0 05/05/2017 1215   HDL 39.80 05/05/2017 1215   CHOLHDL 2 05/05/2017 1215   VLDL 14.4 05/05/2017 1215   LDLCALC 42 05/05/2017 1215    CBC    Component Value Date/Time   WBC 4.7 05/05/2017 1215   RBC 4.58 05/05/2017 1215   HGB 13.2 05/05/2017 1215   HCT 38.0 (L) 05/05/2017 1215   PLT 135.0 (L) 05/05/2017 1215   MCV 82.8 05/05/2017 1215   MCHC 34.7 05/05/2017 1215   RDW 16.1 (H) 05/05/2017 1215    Hgb A1C No results found for: HGBA1C         Assessment & Plan:   Preventative Health Maintenance:  He  declines flu shot today Tetanus UTD Encouraged him to consume a balanced diet and exercise regimen Advised him to see a dentist annually Will check CBC, CMET and Lipid profile today  Folliculitis:  Discussed avoiding shaving as least as possible Discussed changing razors after every shave eRx for Bactroban BID prn  Eczema:  Encouraged moisturizing 2 x day Avoid hot showers/baths eRx for Triamcinolone 0.1% BID prn  RTC in 1 year, sooner if needed Nicki Reaper, NP

## 2018-06-03 NOTE — Addendum Note (Signed)
Addended by: Roena Malady on: 06/03/2018 11:29 AM   Modules accepted: Orders

## 2018-08-17 ENCOUNTER — Encounter: Payer: Self-pay | Admitting: Family Medicine

## 2018-08-17 ENCOUNTER — Encounter: Payer: Self-pay | Admitting: *Deleted

## 2018-08-17 ENCOUNTER — Ambulatory Visit (INDEPENDENT_AMBULATORY_CARE_PROVIDER_SITE_OTHER): Payer: 59 | Admitting: Family Medicine

## 2018-08-17 VITALS — BP 112/62 | HR 94 | Temp 98.8°F | Ht 71.0 in | Wt 224.0 lb

## 2018-08-17 DIAGNOSIS — J069 Acute upper respiratory infection, unspecified: Secondary | ICD-10-CM

## 2018-08-17 DIAGNOSIS — B9789 Other viral agents as the cause of diseases classified elsewhere: Secondary | ICD-10-CM | POA: Diagnosis not present

## 2018-08-17 NOTE — Progress Notes (Signed)
Subjective:    Patient ID: Patrick Hahn, male    DOB: [birth year], 38 y.o.   MRN: [id number]  HPI This is a 38 yo male who presents today with cough x 4 days. He is accompanied by his young daughter. Had fever to 104, body aches, nausea without vomiting. Some cough with green/yellow phlegm. Family members sick. No history of asthma.  Has taken Theraflu, Mucinex dm, hot liquids with temporary relief. No SOB, no wheeze.  No flu shot this year. 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   No past medical history on file. No past surgical history on file. Family History  Problem Relation Age of Onset  . Cancer Maternal Uncle        throat  . Diabetes Neg Hx   . Early death Neg Hx   . Heart disease Neg Hx   . Stroke Neg Hx    Social History   Tobacco Use  . Smoking status: Never Smoker  . Smokeless tobacco: Never Used  Substance Use Topics  . Alcohol use: Yes    Alcohol/week: 1.0 standard drinks    Types: 1 Shots of liquor per week    Comment: occasional  . Drug use: No      Review of Systems Per HPI    Objective:   Physical Exam Vitals signs reviewed.  Constitutional:      General: He is not in acute distress.    Appearance: Normal appearance. He is ill-appearing. He is not toxic-appearing.  HENT:     Head: Normocephalic and atraumatic.     Right Ear: Tympanic membrane, ear canal and external ear normal.     Left Ear: Tympanic membrane, ear canal and external ear normal.     Nose: Congestion and rhinorrhea present.     Mouth/Throat:     Mouth: Mucous membranes are moist.     Pharynx: Oropharynx is clear.  Eyes:     Conjunctiva/sclera: Conjunctivae normal.  Neck:     Musculoskeletal: Normal range of motion and neck supple.  Cardiovascular:     Rate and Rhythm: Normal rate and regular rhythm.     Heart sounds: Normal heart sounds.  Pulmonary:     Effort: Pulmonary effort is normal.     Breath sounds: Normal breath sounds.  Lymphadenopathy:     Cervical: No  cervical adenopathy.  Skin:    General: Skin is warm and dry.  Neurological:     Mental Status: He is alert and oriented to person, place, and time.  Psychiatric:        Mood and Affect: Mood normal.        Behavior: Behavior normal.        Thought Content: Thought content normal.        Judgment: Judgment normal.       BP 112/62   Pulse 94   Temp 98.8 F (37.1 C) (Oral)   Ht 5\' 11"  (1.803 m)   Wt 224 lb (101.6 kg)   SpO2 97%   BMI 31.24 kg/m  Wt Readings from Last 3 Encounters:  08/17/18 224 lb (101.6 kg)  05/28/18 230 lb (104.3 kg)  03/11/18 228 lb (103.4 kg)       Assessment & Plan:  1. Viral URI with cough - suspect influenza, he is outside treatment window so not tested - Provided written and verbal information regarding diagnosis and treatment. - RTC/ER precautions reviewed - OOW tonight, can extend if he remains febrile - written and  verbal instructions provided for symptomatic relief measures   Olean Reeeborah Denim Start, FNP-BC  [facility], [facility]  08/18/2018 6:57 AM

## 2018-08-17 NOTE — Patient Instructions (Signed)
Good to see you today  I think you have the flu  For nasal congestion you can use Afrin nasal spray for 3 days max, Sudafed, saline nasal spray (generic is fine for all). For cough you can try Delsym. Drink enough fluids to make your urine light yellow. For fever/chill/muscle aches you can take over the counter acetaminophen or ibuprofen.  Please come back in if you are not better in 5-7 days or if you develop wheezing, shortness of breath or persistent vomiting.   Influenza, Adult Influenza is also called "the flu." It is an infection in the lungs, nose, and throat (respiratory tract). It is caused by a virus. The flu causes symptoms that are similar to symptoms of a cold. It also causes a high fever and body aches. The flu spreads easily from person to person (is contagious). Getting a flu shot (influenza vaccination) every year is the best way to prevent the flu. What are the causes? This condition is caused by the influenza virus. You can get the virus by:  Breathing in droplets that are in the air from the cough or sneeze of a person who has the virus.  Touching something that has the virus on it (is contaminated) and then touching your mouth, nose, or eyes. What increases the risk? Certain things may make you more likely to get the flu. These include:  Not washing your hands often.  Having close contact with many people during cold and flu season.  Touching your mouth, eyes, or nose without first washing your hands.  Not getting a flu shot every year. You may have a higher risk for the flu, along with serious problems such as a lung infection (pneumonia), if you:  Are older than 65.  Are pregnant.  Have a weakened disease-fighting system (immune system) because of a disease or taking certain medicines.  Have a long-term (chronic) illness, such as: ? Heart, kidney, or lung disease. ? Diabetes. ? Asthma.  Have a liver disorder.  Are very overweight (morbidly  obese).  Have anemia. This is a condition that affects your red blood cells. What are the signs or symptoms? Symptoms usually begin suddenly and last 4-14 days. They may include:  Fever and chills.  Headaches, body aches, or muscle aches.  Sore throat.  Cough.  Runny or stuffy (congested) nose.  Chest discomfort.  Not wanting to eat as much as normal (poor appetite).  Weakness or feeling tired (fatigue).  Dizziness.  Feeling sick to your stomach (nauseous) or throwing up (vomiting). How is this treated? If the flu is found early, you can be treated with medicine that can help reduce how bad the illness is and how long it lasts (antiviral medicine). This may be given by mouth (orally) or through an IV tube. Taking care of yourself at home can help your symptoms get better. Your doctor may suggest:  Taking over-the-counter medicines.  Drinking plenty of fluids. The flu often goes away on its own. If you have very bad symptoms or other problems, you may be treated in a hospital. Follow these instructions at home:     Activity  Rest as needed. Get plenty of sleep.  Stay home from work or school as told by your doctor. ? Do not leave home until you do not have a fever for 24 hours without taking medicine. ? Leave home only to visit your doctor. Eating and drinking  Take an ORS (oral rehydration solution). This is a drink that is sold  at pharmacies and stores.  Drink enough fluid to keep your pee (urine) pale yellow.  Drink clear fluids in small amounts as you are able. Clear fluids include: ? Water. ? Ice chips. ? Fruit juice that has water added (diluted fruit juice). ? Low-calorie sports drinks.  Eat bland, easy-to-digest foods in small amounts as you are able. These foods include: ? Bananas. ? Applesauce. ? Rice. ? Lean meats. ? Toast. ? Crackers.  Do not eat or drink: ? Fluids that have a lot of sugar or caffeine. ? Alcohol. ? Spicy or fatty  foods. General instructions  Take over-the-counter and prescription medicines only as told by your doctor.  Use a cool mist humidifier to add moisture to the air in your home. This can make it easier for you to breathe.  Cover your mouth and nose when you cough or sneeze.  Wash your hands with soap and water often, especially after you cough or sneeze. If you cannot use soap and water, use alcohol-based hand sanitizer.  Keep all follow-up visits as told by your doctor. This is important. How is this prevented?   Get a flu shot every year. You may get the flu shot in late summer, fall, or winter. Ask your doctor when you should get your flu shot.  Avoid contact with people who are sick during fall and winter (cold and flu season). Contact a doctor if:  You get new symptoms.  You have: ? Chest pain. ? Watery poop (diarrhea). ? A fever.  Your cough gets worse.  You start to have more mucus.  You feel sick to your stomach.  You throw up. Get help right away if you:  Have shortness of breath.  Have trouble breathing.  Have skin or nails that turn a bluish color.  Have very bad pain or stiffness in your neck.  Get a sudden headache.  Get sudden pain in your face or ear.  Cannot eat or drink without throwing up. Summary  Influenza ("the flu") is an infection in the lungs, nose, and throat. It is caused by a virus.  Take over-the-counter and prescription medicines only as told by your doctor.  Getting a flu shot every year is the best way to avoid getting the flu. This information is not intended to replace advice given to you by your health care provider. Make sure you discuss any questions you have with your health care provider. Document Released: 05/07/2008 Document Revised: 01/14/2018 Document Reviewed: 01/14/2018 Elsevier Interactive Patient Education  2019 [employer].

## 2018-08-18 ENCOUNTER — Encounter: Payer: Self-pay | Admitting: Family Medicine

## 2018-08-19 ENCOUNTER — Encounter: Payer: Self-pay | Admitting: Internal Medicine

## 2018-08-19 ENCOUNTER — Telehealth: Payer: Self-pay | Admitting: Internal Medicine

## 2018-08-19 NOTE — Telephone Encounter (Signed)
Letter done. Pt aware.

## 2018-08-19 NOTE — Telephone Encounter (Signed)
Best number [phone]  Pt called stating he saw debbie on 08/17/2018 and he didn't go to work yesterday and needs his out of work note stated he can returned to work  on 08/20/2018.  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

## 2018-08-19 NOTE — Telephone Encounter (Signed)
That is ok, please write for him.

## 2018-09-04 ENCOUNTER — Other Ambulatory Visit (INDEPENDENT_AMBULATORY_CARE_PROVIDER_SITE_OTHER): Payer: 59

## 2018-09-04 DIAGNOSIS — R7989 Other specified abnormal findings of blood chemistry: Secondary | ICD-10-CM | POA: Diagnosis not present

## 2018-09-04 LAB — CBC
HEMATOCRIT: 36.8 % — AB (ref 39.0–52.0)
HEMOGLOBIN: 12.6 g/dL — AB (ref 13.0–17.0)
MCHC: 34.2 g/dL (ref 30.0–36.0)
MCV: 81.1 fl (ref 78.0–100.0)
Platelets: 173 10*3/uL (ref 150.0–400.0)
RBC: 4.54 Mil/uL (ref 4.22–5.81)
RDW: 17.9 % — AB (ref 11.5–15.5)
WBC: 6.6 10*3/uL (ref 4.0–10.5)

## 2018-09-04 LAB — IBC PANEL
Iron: 51 ug/dL (ref 42–165)
Saturation Ratios: 22.5 % (ref 20.0–50.0)
Transferrin: 162 mg/dL — ABNORMAL LOW (ref 212.0–360.0)

## 2018-12-02 ENCOUNTER — Encounter: Payer: Self-pay | Admitting: Internal Medicine

## 2018-12-03 ENCOUNTER — Encounter: Payer: Self-pay | Admitting: Internal Medicine

## 2018-12-03 ENCOUNTER — Ambulatory Visit (INDEPENDENT_AMBULATORY_CARE_PROVIDER_SITE_OTHER): Payer: 59 | Admitting: Internal Medicine

## 2018-12-03 DIAGNOSIS — M545 Low back pain, unspecified: Secondary | ICD-10-CM

## 2018-12-03 DIAGNOSIS — G8929 Other chronic pain: Secondary | ICD-10-CM | POA: Diagnosis not present

## 2018-12-03 MED ORDER — CYCLOBENZAPRINE HCL 10 MG PO TABS: 10.0000 mg | ORAL_TABLET | Freq: Three times a day (TID) | ORAL | 0 refills | Status: DC | PRN

## 2018-12-03 NOTE — Progress Notes (Signed)
Virtual Visit via Video Note  I connected with Patrick Hahn on 12/03/18 at 10:45 AM EDT by a video enabled telemedicine application and verified that I am speaking with the correct person using two identifiers.   I discussed the limitations of evaluation and management by telemedicine and the availability of in person appointments. The patient expressed understanding and agreed to proceed.  Patient Location: Home Provider Location: Office  History of Present Illness:  Pt reports he is having a flare up of his chronic back pain. This started yesterday. He describes the pain as sore and achy. The pain is worse with position changes. The pain does not radiate down his legs. He denies numbness, tingling or weakness. He denies issues with bowel or bladder. He has taken Ibuprofen with some relief. He is out of his Flexeril and would like a refill of that today. He needs a work note as well.    No past medical history on file.  Current Outpatient Medications  Medication Sig Dispense Refill  . cyclobenzaprine (FLEXERIL) 10 MG tablet Take 1 tablet (10 mg total) by mouth 3 (three) times daily as needed for muscle spasms. 30 tablet 0  . mupirocin ointment (BACTROBAN) 2 % Place 1 application into the nose 2 (two) times daily. 22 g 0  . triamcinolone cream (KENALOG) 0.1 % Apply 1 application topically 2 (two) times daily. 30 g 0   No current facility-administered medications for this visit.     No Known Allergies  Family History  Problem Relation Age of Onset  . Cancer Maternal Uncle        throat  . Diabetes Neg Hx   . Early death Neg Hx   . Heart disease Neg Hx   . Stroke Neg Hx     Social History   Socioeconomic History  . Marital status: Married    Spouse name: Not on file  . Number of children: Not on file  . Years of education: Not on file  . Highest education level: Not on file  Occupational History  . Not on file  Social Needs  . Financial resource strain: Not on file  .  Food insecurity:    Worry: Not on file    Inability: Not on file  . Transportation needs:    Medical: Not on file    Non-medical: Not on file  Tobacco Use  . Smoking status: Never Smoker  . Smokeless tobacco: Never Used  Substance and Sexual Activity  . Alcohol use: Yes    Alcohol/week: 1.0 standard drinks    Types: 1 Shots of liquor per week    Comment: occasional  . Drug use: No  . Sexual activity: Yes  Lifestyle  . Physical activity:    Days per week: Not on file    Minutes per session: Not on file  . Stress: Not on file  Relationships  . Social connections:    Talks on phone: Not on file    Gets together: Not on file    Attends religious service: Not on file    Active member of club or organization: Not on file    Attends meetings of clubs or organizations: Not on file    Relationship status: Not on file  . Intimate partner violence:    Fear of current or ex partner: Not on file    Emotionally abused: Not on file    Physically abused: Not on file    Forced sexual activity: Not on file  Other  Topics Concern  . Not on file  Social History Narrative  . Not on file     Constitutional: Denies fever, malaise, fatigue, headache or abrupt weight changes.  Respiratory: Denies difficulty breathing, shortness of breath, cough or sputum production.   Cardiovascular: Denies chest pain, chest tightness, palpitations or swelling in the hands or feet.  Gastrointestinal: Denies abdominal pain, bloating, constipation, diarrhea or blood in the stool.  GU: Denies urgency, frequency, pain with urination, burning sensation, blood in urine, odor or discharge. Musculoskeletal: Pt reports low back pain. Denies decrease in range of motion, difficulty with gait, or joint swelling.  Neurological: Denies numbness, tingling, weakness or problems with balance and coordination.  .  No other specific complaints in a complete review of systems (except as listed in HPI above).  Wt Readings from  Last 3 Encounters:  08/17/18 224 lb (101.6 kg)  05/28/18 230 lb (104.3 kg)  03/11/18 228 lb (103.4 kg)    General: Appears his stated age, well developed, well nourished in NAD. Pulmonary/Chest: Normal effort. No respiratory distress. No wheezes, rales or ronchi noted.  Musculoskeletal: Normal flexion, extension and rotation of the spine. Gait normal. Neurological: Alert and oriented.   Psychiatric: Mood and affect normal. Behavior is normal. Judgment and thought content normal.    BMET    Component Value Date/Time   NA 140 05/28/2018 0838   K 3.5 05/28/2018 0838   CL 105 05/28/2018 0838   CO2 28 05/28/2018 0838   GLUCOSE 106 (H) 05/28/2018 0838   BUN 12 05/28/2018 0838   CREATININE 1.01 05/28/2018 0838   CALCIUM 8.8 05/28/2018 0838    Lipid Panel     Component Value Date/Time   CHOL 95 05/28/2018 0838   TRIG 62.0 05/28/2018 0838   HDL 33.20 (L) 05/28/2018 0838   CHOLHDL 3 05/28/2018 0838   VLDL 12.4 05/28/2018 0838   LDLCALC 49 05/28/2018 0838    CBC    Component Value Date/Time   WBC 6.6 09/04/2018 0902   RBC 4.54 09/04/2018 0902   HGB 12.6 (L) 09/04/2018 0902   HCT 36.8 (L) 09/04/2018 0902   PLT 173.0 09/04/2018 0902   MCV 81.1 09/04/2018 0902   MCHC 34.2 09/04/2018 0902   RDW 17.9 (H) 09/04/2018 0902    Hgb A1C No results found for: HGBA1C       Assessment and Plan:  Acute on Chronic Low Back Pain:  Encouraged regular stretching, exercise and core strengthening Continue Ibuprofen as needed Heat and massage may be helpful Flexeril refilled today Work note provided  Follow Up Instructions:    I discussed the assessment and treatment plan with the patient. The patient was provided an opportunity to ask questions and all were answered. The patient agreed with the plan and demonstrated an understanding of the instructions.   The patient was advised to call back or seek an in-person evaluation if the symptoms worsen or if the condition fails to  improve as anticipated.     Nicki Reaperegina Baity, NP

## 2018-12-03 NOTE — Patient Instructions (Signed)

## 2019-03-29 ENCOUNTER — Encounter: Payer: Self-pay | Admitting: Internal Medicine

## 2019-03-29 ENCOUNTER — Other Ambulatory Visit: Payer: Self-pay

## 2019-03-29 ENCOUNTER — Ambulatory Visit (INDEPENDENT_AMBULATORY_CARE_PROVIDER_SITE_OTHER)
Admission: RE | Admit: 2019-03-29 | Discharge: 2019-03-29 | Disposition: A | Discharge status: Home / Self Care | Payer: 59 | Source: Ambulatory Visit | Attending: Internal Medicine | Admitting: Internal Medicine

## 2019-03-29 ENCOUNTER — Ambulatory Visit (INDEPENDENT_AMBULATORY_CARE_PROVIDER_SITE_OTHER): Payer: 59 | Admitting: Internal Medicine

## 2019-03-29 VITALS — BP 122/70 | HR 100 | Temp 97.9°F | Wt 230.0 lb

## 2019-03-29 DIAGNOSIS — M25561 Pain in right knee: Secondary | ICD-10-CM | POA: Diagnosis not present

## 2019-03-29 MED ORDER — PREDNISONE 10 MG PO TABS: ORAL_TABLET | ORAL | 0 refills | Status: DC

## 2019-03-29 NOTE — Patient Instructions (Signed)

## 2019-03-29 NOTE — Progress Notes (Signed)
Subjective:    Patient ID: Patrick Hahn, male    DOB: 08/06/1981, 38 y.o.   MRN: [id number]  HPI  Pt presents to the clinic today with c/o right knee pain. This started 2 days ago. He describes the pain as stiff. He is having difficulty bending his knee. He reports it feels like it needs to "pop". He has noticed some associated swelling. He denies any injury to the area but did notice this after cleaning the house on Saturday. He has tried ice and a knee brace with some relief.  Review of Systems      No past medical history on file.  Current Outpatient Medications  Medication Sig Dispense Refill  . cyclobenzaprine (FLEXERIL) 10 MG tablet Take 1 tablet (10 mg total) by mouth 3 (three) times daily as needed for muscle spasms. 30 tablet 0  . mupirocin ointment (BACTROBAN) 2 % Place 1 application into the nose 2 (two) times daily. 22 g 0  . triamcinolone cream (KENALOG) 0.1 % Apply 1 application topically 2 (two) times daily. 30 g 0   No current facility-administered medications for this visit.     No Known Allergies  Family History  Problem Relation Age of Onset  . Cancer Maternal Uncle        throat  . Diabetes Neg Hx   . Early death Neg Hx   . Heart disease Neg Hx   . Stroke Neg Hx     Social History   Socioeconomic History  . Marital status: Married    Spouse name: Not on file  . Number of children: Not on file  . Years of education: Not on file  . Highest education level: Not on file  Occupational History  . Not on file  Social Needs  . Financial resource strain: Not on file  . Food insecurity    Worry: Not on file    Inability: Not on file  . Transportation needs    Medical: Not on file    Non-medical: Not on file  Tobacco Use  . Smoking status: Never Smoker  . Smokeless tobacco: Never Used  Substance and Sexual Activity  . Alcohol use: Yes    Alcohol/week: 1.0 standard drinks    Types: 1 Shots of liquor per week    Comment: occasional  . Drug use:  No  . Sexual activity: Yes  Lifestyle  . Physical activity    Days per week: Not on file    Minutes per session: Not on file  . Stress: Not on file  Relationships  . Social Herbalist on phone: Not on file    Gets together: Not on file    Attends religious service: Not on file    Active member of club or organization: Not on file    Attends meetings of clubs or organizations: Not on file    Relationship status: Not on file  . Intimate partner violence    Fear of current or ex partner: Not on file    Emotionally abused: Not on file    Physically abused: Not on file    Forced sexual activity: Not on file  Other Topics Concern  . Not on file  Social History Narrative  . Not on file     Constitutional: Denies fever, malaise, fatigue, headache or abrupt weight changes.  Respiratory: Denies difficulty breathing, shortness of breath, cough or sputum production.   Cardiovascular: Denies chest pain, chest tightness, palpitations or swelling in  the hands or feet.  Musculoskeletal: Pt reports right knee pain, swelling and decreased ROM. Denies difficulty with gait, muscle pain.    No other specific complaints in a complete review of systems (except as listed in HPI above).  Objective:   Physical Exam   BP 122/70   Pulse 100   Temp 97.9 F (36.6 C) (Temporal)   Wt 230 lb (104.3 kg)   SpO2 98%   BMI 32.08 kg/m  Wt Readings from Last 3 Encounters:  03/29/19 230 lb (104.3 kg)  08/17/18 224 lb (101.6 kg)  05/28/18 230 lb (104.3 kg)    General: Appears his stated age, obese, in NAD. Skin: Warm, dry and intact. No redness or warmth noted. Cardiovascular: Normal rate and rhythm.  Pulmonary/Chest: Normal effort and positive vesicular breath sounds. No respiratory distress. No wheezes, rales or ronchi noted.  Musculoskeletal: Normal extension of the right knee. Pain with flexion. Pain with palpation of the popliteal fossa. Possible Baker's Cyst noted. Strength 5/5 BLE.  No difficulty with gait. Neurological: Alert and oriented.    BMET    Component Value Date/Time   NA 140 05/28/2018 0838   K 3.5 05/28/2018 0838   CL 105 05/28/2018 0838   CO2 28 05/28/2018 0838   GLUCOSE 106 (H) 05/28/2018 0838   BUN 12 05/28/2018 0838   CREATININE 1.01 05/28/2018 0838   CALCIUM 8.8 05/28/2018 0838    Lipid Panel     Component Value Date/Time   CHOL 95 05/28/2018 0838   TRIG 62.0 05/28/2018 0838   HDL 33.20 (L) 05/28/2018 0838   CHOLHDL 3 05/28/2018 0838   VLDL 12.4 05/28/2018 0838   LDLCALC 49 05/28/2018 0838    CBC    Component Value Date/Time   WBC 6.6 09/04/2018 0902   RBC 4.54 09/04/2018 0902   HGB 12.6 (L) 09/04/2018 0902   HCT 36.8 (L) 09/04/2018 0902   PLT 173.0 09/04/2018 0902   MCV 81.1 09/04/2018 0902   MCHC 34.2 09/04/2018 0902   RDW 17.9 (H) 09/04/2018 0902    Hgb A1C No results found for: HGBA1C         Assessment & Plan:   Acute Right Knee Pain:  Possible Baker's Cyst Xray right knee for further eval Continue ice and compression RX for Pred Taper x 6 days Consider follow up with ortho  Will follow up after xray, return precautions discussed Nicki Reaperegina Carsten Carstarphen, NP

## 2019-05-03 ENCOUNTER — Other Ambulatory Visit: Payer: Self-pay

## 2019-05-03 DIAGNOSIS — Z20822 Contact with and (suspected) exposure to covid-19: Secondary | ICD-10-CM

## 2019-05-05 LAB — NOVEL CORONAVIRUS, NAA: SARS-CoV-2, NAA: NOT DETECTED

## 2019-06-14 ENCOUNTER — Other Ambulatory Visit: Payer: Self-pay

## 2019-06-14 DIAGNOSIS — Z20822 Contact with and (suspected) exposure to covid-19: Secondary | ICD-10-CM

## 2019-06-15 LAB — NOVEL CORONAVIRUS, NAA: SARS-CoV-2, NAA: NOT DETECTED

## 2019-06-17 ENCOUNTER — Ambulatory Visit (INDEPENDENT_AMBULATORY_CARE_PROVIDER_SITE_OTHER): Payer: 59 | Admitting: Family Medicine

## 2019-06-17 ENCOUNTER — Other Ambulatory Visit: Payer: Self-pay

## 2019-06-17 ENCOUNTER — Encounter: Payer: Self-pay | Admitting: Family Medicine

## 2019-06-17 VITALS — BP 102/59 | HR 89 | Temp 96.3°F | Ht 71.0 in | Wt 236.5 lb

## 2019-06-17 DIAGNOSIS — M778 Other enthesopathies, not elsewhere classified: Secondary | ICD-10-CM

## 2019-06-17 NOTE — Patient Instructions (Signed)
Get a carpal tunnel / cock-up wrist splint at [facility] or other store like that.   Motrin 600 - 800 mg recommended three times a day. (Over the counter Motrin, Advil, or Generic Ibuprofen 200 mg tablets. 3-4 tablets by mouth 3 times a day. This equals a prescription strength dose.)

## 2019-06-17 NOTE — Progress Notes (Signed)
Patrick Hahn T. Mikaele Stecher, MD Primary Care and [place] at [facility] [place], [address] Phone: [phone]  FAX: [place] - 38 y.o. male  MRN [id number]  Date of Birth: 1981/07/05  Visit Date: 06/17/2019  PCP: Jearld Fenton, NP  Referred by: Jearld Fenton, NP  Chief Complaint  Patient presents with  . Wrist Pain    Right   Subjective:   Patrick Hahn is a 38 y.o. very pleasant male patient with Body mass index is 32.99 kg/m. who presents with the following:  Sweeps a lot at work.  Custodian.  Ulnar tendon TTP. For 1 day he has had some mild ulnar sided wrist pain with movement.  No trauma or other injury. No numbness or tingling.  patinst  Past Medical History, Surgical History, Social History, Family History, Problem List, Medications, and Allergies have been reviewed and updated if relevant.  Patient Active Problem List   Diagnosis Date Noted  . Chronic back pain 05/05/2017    History reviewed. No pertinent past medical history.  History reviewed. No pertinent surgical history.  Social History   Socioeconomic History  . Marital status: Married    Spouse name: Not on file  . Number of children: Not on file  . Years of education: Not on file  . Highest education level: Not on file  Occupational History  . Not on file  Social Needs  . Financial resource strain: Not on file  . Food insecurity    Worry: Not on file    Inability: Not on file  . Transportation needs    Medical: Not on file    Non-medical: Not on file  Tobacco Use  . Smoking status: Never Smoker  . Smokeless tobacco: Never Used  Substance and Sexual Activity  . Alcohol use: Yes    Alcohol/week: 1.0 standard drinks    Types: 1 Shots of liquor per week    Comment: occasional  . Drug use: No  . Sexual activity: Yes  Lifestyle  . Physical activity    Days per week: Not on file    Minutes per session: Not on  file  . Stress: Not on file  Relationships  . Social Herbalist on phone: Not on file    Gets together: Not on file    Attends religious service: Not on file    Active member of club or organization: Not on file    Attends meetings of clubs or organizations: Not on file    Relationship status: Not on file  . Intimate partner violence    Fear of current or ex partner: Not on file    Emotionally abused: Not on file    Physically abused: Not on file    Forced sexual activity: Not on file  Other Topics Concern  . Not on file  Social History Narrative  . Not on file    Family History  Problem Relation Age of Onset  . Cancer Maternal Uncle        throat  . Diabetes Neg Hx   . Early death Neg Hx   . Heart disease Neg Hx   . Stroke Neg Hx     No Known Allergies  Medication list reviewed and updated in full in [place].  GEN: No fevers, chills. Nontoxic. Primarily MSK c/o today. MSK: Detailed in the HPI GI: tolerating PO intake without difficulty Neuro: No numbness,  parasthesias, or tingling associated. Otherwise the pertinent positives of the ROS are noted above.   Objective:   Temp (!) 96.3 F (35.7 C) (Temporal)   Ht 5\' 11"  (1.803 m)   Wt 236 lb 8 oz (107.3 kg)   BMI 32.99 kg/m    GEN: WDWN, NAD, Non-toxic, Alert & Oriented x 3 HEENT: Atraumatic, Normocephalic.  Ears and Nose: No external deformity. EXTR: No clubbing/cyanosis/edema NEURO: Normal gait.  PSYCH: Normally interactive. Conversant. Not depressed or anxious appearing.  Calm demeanor.   Hand: R Ecchymosis or edema: neg ROM wrist/hand/digits/elbow: full  Carpals, MCP's, digits: NT Distal Ulna and Radius: mild pain with ulnar deviation Supination lift test: neg Ecchymosis or edema: neg Cysts/nodules: neg Finkelstein's test: neg Snuffbox tenderness: neg Scaphoid tubercle: NT Hook of Hamate: NT Resisted supination: NT Full composite fist Grip, all digits: 5/5 str No  tenosynovitis Axial load test: neg Phalen's: neg Tinel's: neg Atrophy: neg  Hand sensation: intact   Radiology: No results found.  Assessment and Plan:     ICD-10-CM   1. Wrist tendonitis  M77.8    Mild.  Patient Instructions  Get a carpal tunnel / cock-up wrist splint at [facility] or other store like that.   Motrin 600 - 800 mg recommended three times a day. (Over the counter Motrin, Advil, or Generic Ibuprofen 200 mg tablets. 3-4 tablets by mouth 3 times a day. This equals a prescription strength dose.)     Follow-up: No follow-ups on file.  No orders of the defined types were placed in this encounter.  No orders of the defined types were placed in this encounter.   Signed,  . Janecia Palau, MD   Outpatient Encounter Medications as of 06/17/2019  Medication Sig  . [DISCONTINUED] predniSONE (DELTASONE) 10 MG tablet Take 6 tabs day 1, 5 tabs day 2, 4 tabs day 3, 3 tabs day 4, 2 tabs day 5, 1 tab day 6   No facility-administered encounter medications on file as of 06/17/2019.

## 2019-11-25 ENCOUNTER — Ambulatory Visit (INDEPENDENT_AMBULATORY_CARE_PROVIDER_SITE_OTHER): Payer: 59 | Admitting: Internal Medicine

## 2019-11-25 ENCOUNTER — Other Ambulatory Visit: Payer: Self-pay

## 2019-11-25 ENCOUNTER — Encounter: Payer: Self-pay | Admitting: Internal Medicine

## 2019-11-25 VITALS — BP 124/84 | HR 86 | Temp 98.0°F | Wt 245.0 lb

## 2019-11-25 DIAGNOSIS — R5383 Other fatigue: Secondary | ICD-10-CM | POA: Diagnosis not present

## 2019-11-25 DIAGNOSIS — T881XXA Other complications following immunization, not elsewhere classified, initial encounter: Secondary | ICD-10-CM | POA: Diagnosis not present

## 2019-11-25 NOTE — Progress Notes (Signed)
Subjective:    Patient ID: Patrick Hahn, male    DOB: 1981/06/24, 39 y.o.   MRN: [id number]  HPI  Pt presents to the clinic today with c/o extreme fatigue after receiving his second Covid vaccine last Friday 11/19/19. He had to leave work early because he felt so sluggish and fatigued. He has missed work 4/13-4/15 for the same. He would like a work note for these missed days. He reports he still has some fatigue but overall it is improving. He would like to return to work tomorrow night.  Review of Systems      No past medical history on file.  No current outpatient medications on file.   No current facility-administered medications for this visit.    No Known Allergies  Family History  Problem Relation Age of Onset  . Cancer Maternal Uncle        throat  . Diabetes Neg Hx   . Early death Neg Hx   . Heart disease Neg Hx   . Stroke Neg Hx     Social History   Socioeconomic History  . Marital status: Married    Spouse name: Not on file  . Number of children: Not on file  . Years of education: Not on file  . Highest education level: Not on file  Occupational History  . Not on file  Tobacco Use  . Smoking status: Never Smoker  . Smokeless tobacco: Never Used  Substance and Sexual Activity  . Alcohol use: Yes    Alcohol/week: 1.0 standard drinks    Types: 1 Shots of liquor per week    Comment: occasional  . Drug use: No  . Sexual activity: Yes  Other Topics Concern  . Not on file  Social History Narrative  . Not on file   Social Determinants of Health   Financial Resource Strain:   . Difficulty of Paying Living Expenses:   Food Insecurity:   . Worried About Charity fundraiser in the Last Year:   . Arboriculturist in the Last Year:   Transportation Needs:   . Film/video editor (Medical):   Marland Kitchen Lack of Transportation (Non-Medical):   Physical Activity:   . Days of Exercise per Week:   . Minutes of Exercise per Session:   Stress:   . Feeling of  Stress :   Social Connections:   . Frequency of Communication with Friends and Family:   . Frequency of Social Gatherings with Friends and Family:   . Attends Religious Services:   . Active Member of Clubs or Organizations:   . Attends Archivist Meetings:   Marland Kitchen Marital Status:   Intimate Partner Violence:   . Fear of Current or Ex-Partner:   . Emotionally Abused:   Marland Kitchen Physically Abused:   . Sexually Abused:      Constitutional: Pt reports fatigue. Denies fever, malaise, headache or abrupt weight changes.  HEENT: Denies eye pain, eye redness, ear pain, ringing in the ears, wax buildup, runny nose, nasal congestion, bloody nose, or sore throat. Respiratory: Denies difficulty breathing, shortness of breath, cough or sputum production.   Cardiovascular: Denies chest pain, chest tightness, palpitations or swelling in the hands or feet.  Neurological: Denies dizziness, difficulty with memory, difficulty with speech or problems with balance and coordination.    No other specific complaints in a complete review of systems (except as listed in HPI above).  Objective:   Physical Exam  BP 124/84  Pulse 86   Temp 98 F (36.7 C) (Temporal)   Wt 245 lb (111.1 kg)   SpO2 98%   BMI 34.17 kg/m   Wt Readings from Last 3 Encounters:  06/17/19 236 lb 8 oz (107.3 kg)  03/29/19 230 lb (104.3 kg)  08/17/18 224 lb (101.6 kg)    General: Appears her stated age, well developed, well nourished in NAD. Skin: Warm, dry and intact. No rashes noted. Cardiovascular: Normal rate and rhythm. S1,S2 noted.  No murmur, rubs or gallops noted.  Pulmonary/Chest: Normal effort and positive vesicular breath sounds. No respirator distress. No wheezes, rales or ronchi noted.  Neurological: Alert and oriented.    BMET    Component Value Date/Time   NA 140 05/28/2018 0838   K 3.5 05/28/2018 0838   CL 105 05/28/2018 0838   CO2 28 05/28/2018 0838   GLUCOSE 106 (H) 05/28/2018 0838   BUN 12  05/28/2018 0838   CREATININE 1.01 05/28/2018 0838   CALCIUM 8.8 05/28/2018 0838    Lipid Panel     Component Value Date/Time   CHOL 95 05/28/2018 0838   TRIG 62.0 05/28/2018 0838   HDL 33.20 (L) 05/28/2018 0838   CHOLHDL 3 05/28/2018 0838   VLDL 12.4 05/28/2018 0838   LDLCALC 49 05/28/2018 0838    CBC    Component Value Date/Time   WBC 6.6 09/04/2018 0902   RBC 4.54 09/04/2018 0902   HGB 12.6 (L) 09/04/2018 0902   HCT 36.8 (L) 09/04/2018 0902   PLT 173.0 09/04/2018 0902   MCV 81.1 09/04/2018 0902   MCHC 34.2 09/04/2018 0902   RDW 17.9 (H) 09/04/2018 0902    Hgb A1C No results found for: HGBA1C          Assessment & Plan:   Fatigue, Immunization Side Effect:  Improving Encouraged adequate rest Work note provided  Return precautions discussed Nicki Reaper, NP This visit occurred during the SARS-CoV-2 public health emergency.  Safety protocols were in place, including screening questions prior to the visit, additional usage of staff PPE, and extensive cleaning of exam room while observing appropriate contact time as indicated for disinfecting solutions.

## 2019-11-25 NOTE — Patient Instructions (Signed)

## 2019-12-18 IMAGING — DX LEFT KNEE - COMPLETE 4+ VIEW
4 series · 4 of 4 positions shown · non-contrast
Comparison: None

CLINICAL DATA: RIGHT knee pain for few days, no injury

EXAM:
LEFT KNEE - COMPLETE 4+ VIEW

[knee ap]
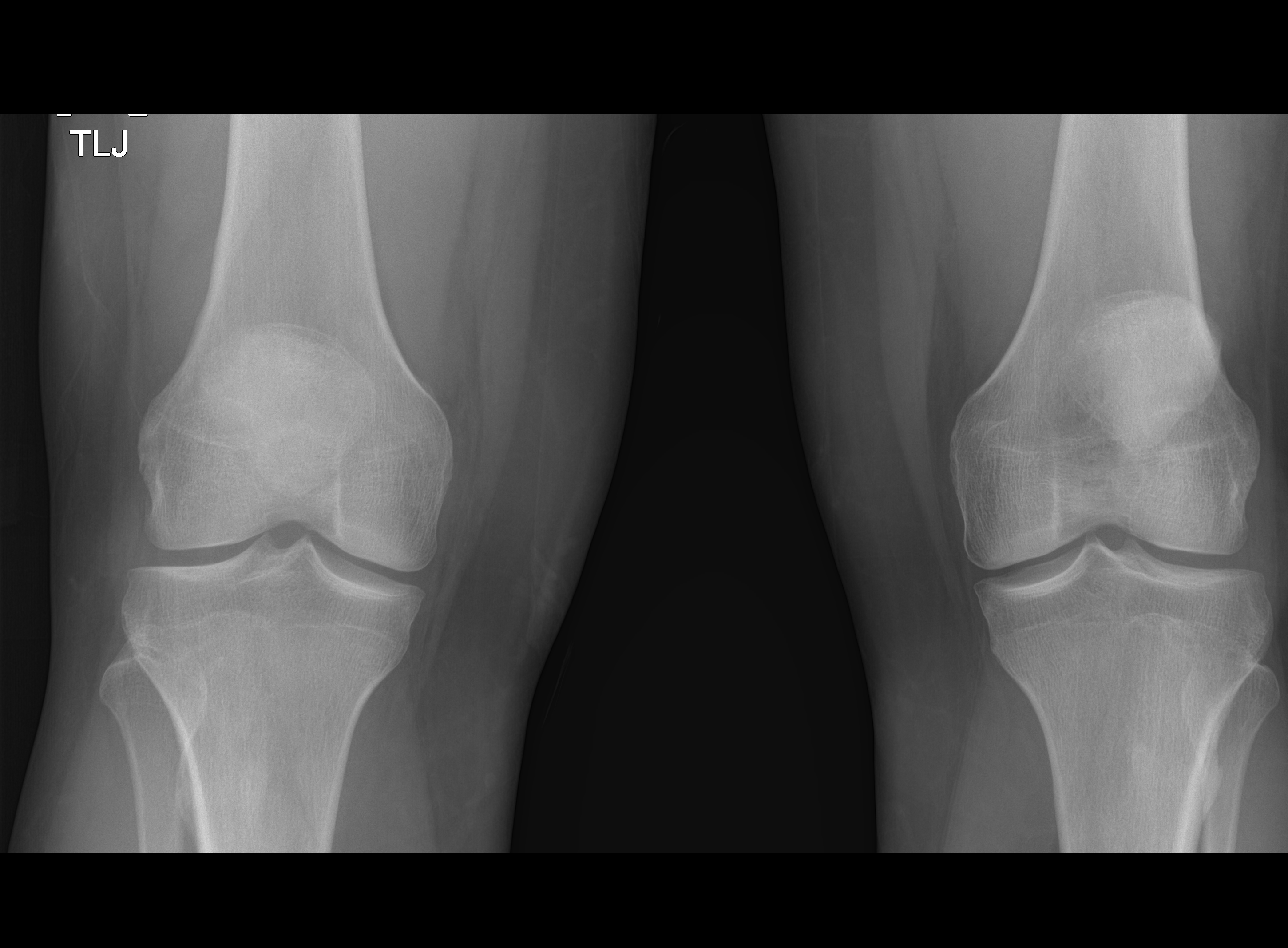

[knee tunnel]
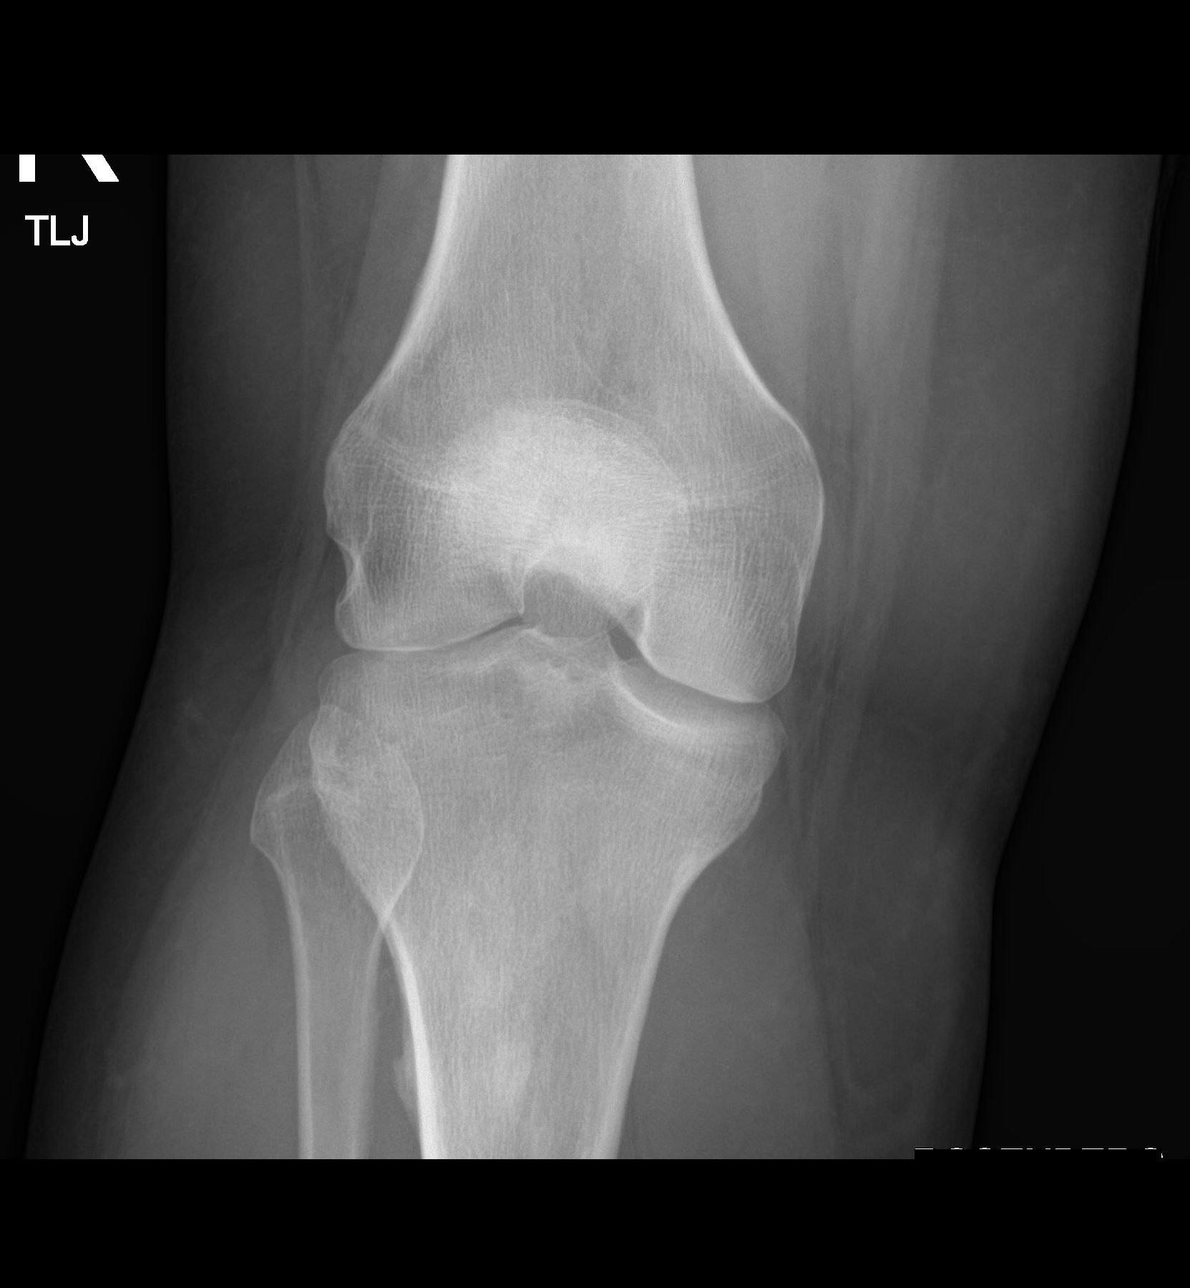

[knee lat]
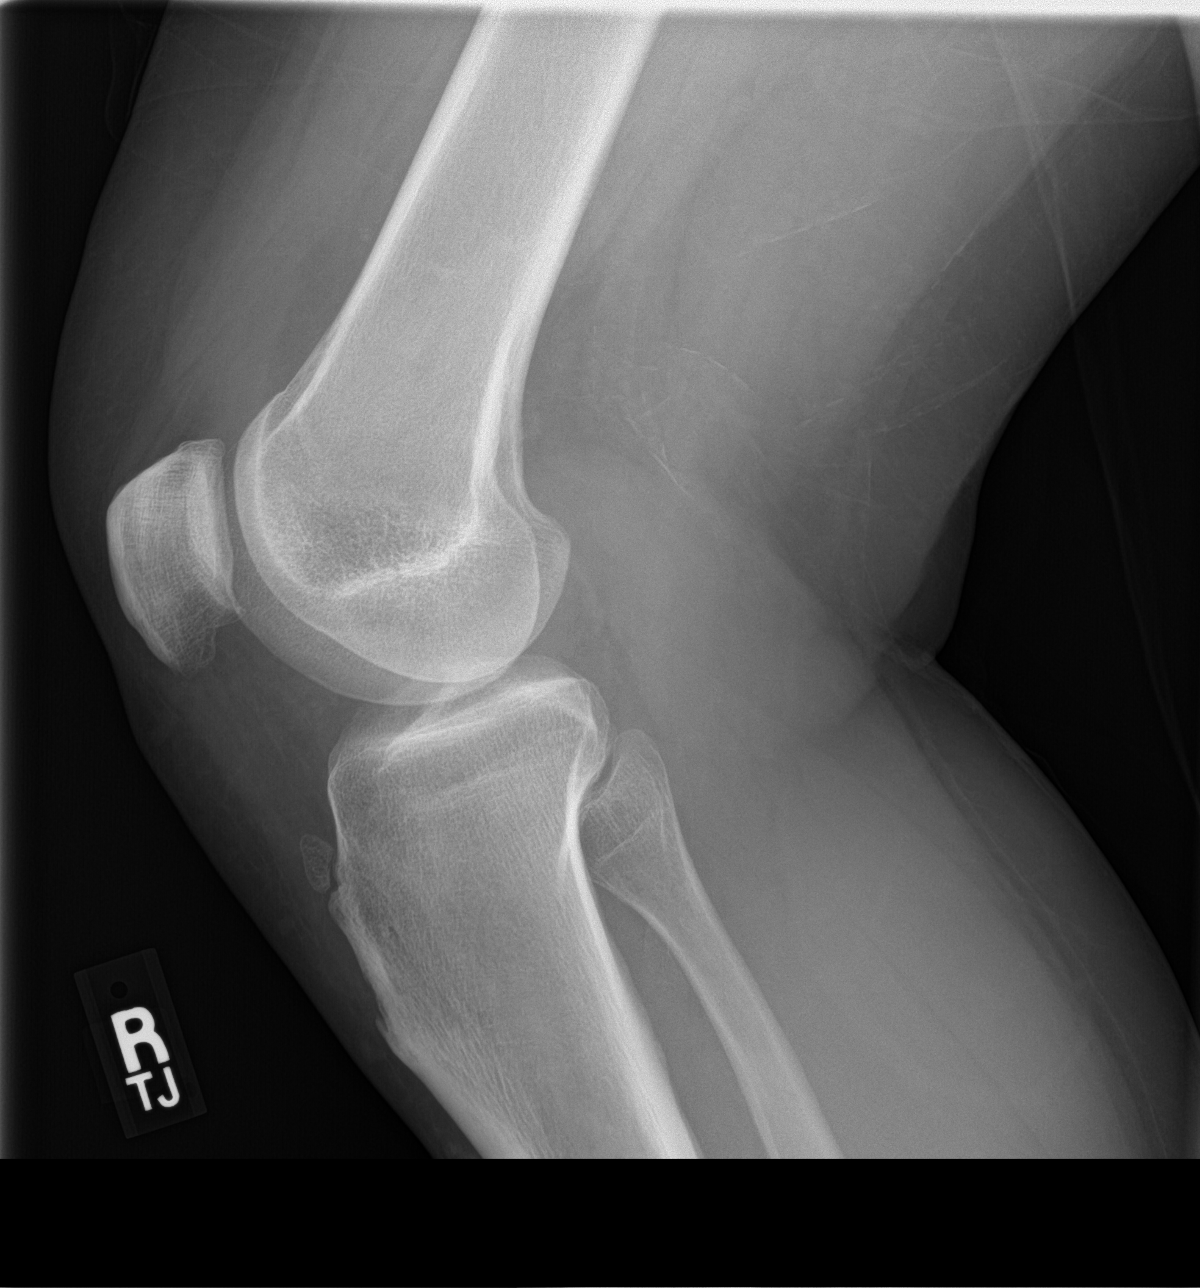

[patella skyline]
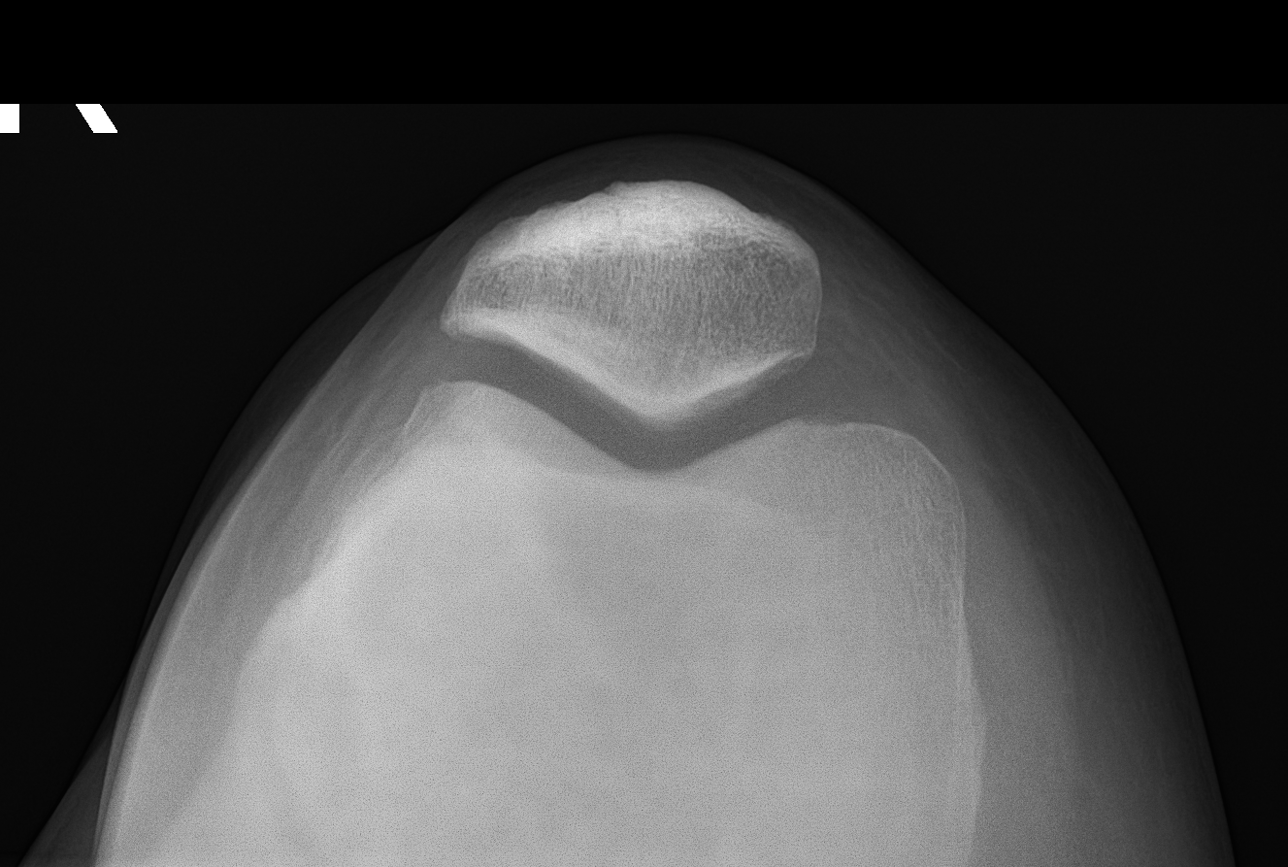

[4 of 4 positions shown; findings below may reference images not displayed]

FINDINGS: Osseous mineralization low normal.

Minimal joint space narrowing.

No fracture, dislocation or bone destruction.

No joint effusion.
IMPRESSION: Minimal degenerative changes.

No acute abnormalities.

## 2020-03-09 ENCOUNTER — Other Ambulatory Visit: Payer: Self-pay

## 2020-03-09 ENCOUNTER — Telehealth: Payer: Self-pay | Admitting: Internal Medicine

## 2020-03-09 ENCOUNTER — Encounter: Payer: Self-pay | Admitting: Internal Medicine

## 2020-03-09 ENCOUNTER — Ambulatory Visit (INDEPENDENT_AMBULATORY_CARE_PROVIDER_SITE_OTHER): Payer: 59 | Admitting: Internal Medicine

## 2020-03-09 VITALS — BP 126/78 | HR 97 | Temp 97.9°F | Wt 254.0 lb

## 2020-03-09 DIAGNOSIS — G8929 Other chronic pain: Secondary | ICD-10-CM | POA: Diagnosis not present

## 2020-03-09 DIAGNOSIS — Z0289 Encounter for other administrative examinations: Secondary | ICD-10-CM | POA: Diagnosis not present

## 2020-03-09 DIAGNOSIS — M545 Low back pain, unspecified: Secondary | ICD-10-CM

## 2020-03-09 NOTE — Assessment & Plan Note (Signed)
FMLA forms given to Patrick Hahn for completion, will approve July 22-24 continuous leave, with intermittently for 3 8-hour days per month Encourage weight loss and core strengthening Continue Tylenol and Ibuprofen OTC as needed

## 2020-03-09 NOTE — Telephone Encounter (Signed)
FMLA paperwork in [employer] in box For review and signature 

## 2020-03-09 NOTE — Telephone Encounter (Signed)
Signed, given back to Robin 

## 2020-03-09 NOTE — Patient Instructions (Signed)

## 2020-03-09 NOTE — Telephone Encounter (Signed)
Left message asking pt to call office  Please let pt know his paperwork is completed and is up front.  PT will need to turn in paperwork   Copy for pt Copy for file

## 2020-03-09 NOTE — Progress Notes (Signed)
Subjective:    Patient ID: Patrick Hahn, male    DOB: 06-24-1981, 39 y.o.   MRN: [id number]  HPI  Patient presents the clinic today with complaint of intermittent back pain.  He has a history of chronic back pain that flares intermittently.  He reports he recently had a flare that seems to be improving at this time.  When his back pain flares, he will take ibuprofen or Tylenol OTC in addition to stretching.  X-ray lumbar spine and from 10/2015 reviewed.  He has FMLA forms that he would like completed today.  Review of Systems      No past medical history on file.  No current outpatient medications on file.   No current facility-administered medications for this visit.    No Known Allergies  Family History  Problem Relation Age of Onset  . Cancer Maternal Uncle        throat  . Diabetes Neg Hx   . Early death Neg Hx   . Heart disease Neg Hx   . Stroke Neg Hx     Social History   Socioeconomic History  . Marital status: Married    Spouse name: Not on file  . Number of children: Not on file  . Years of education: Not on file  . Highest education level: Not on file  Occupational History  . Not on file  Tobacco Use  . Smoking status: Never Smoker  . Smokeless tobacco: Never Used  Substance and Sexual Activity  . Alcohol use: Yes    Alcohol/week: 1.0 standard drink    Types: 1 Shots of liquor per week    Comment: occasional  . Drug use: No  . Sexual activity: Yes  Other Topics Concern  . Not on file  Social History Narrative  . Not on file   Social Determinants of Health   Financial Resource Strain:   . Difficulty of Paying Living Expenses:   Food Insecurity:   . Worried About Programme researcher, broadcasting/film/video in the Last Year:   . Barista in the Last Year:   Transportation Needs:   . Freight forwarder (Medical):   Marland Kitchen Lack of Transportation (Non-Medical):   Physical Activity:   . Days of Exercise per Week:   . Minutes of Exercise per Session:   Stress:    . Feeling of Stress :   Social Connections:   . Frequency of Communication with Friends and Family:   . Frequency of Social Gatherings with Friends and Family:   . Attends Religious Services:   . Active Member of Clubs or Organizations:   . Attends Banker Meetings:   Marland Kitchen Marital Status:   Intimate Partner Violence:   . Fear of Current or Ex-Partner:   . Emotionally Abused:   Marland Kitchen Physically Abused:   . Sexually Abused:      Constitutional: Denies fever, malaise, fatigue, headache or abrupt weight changes.  Respiratory: Denies difficulty breathing, shortness of breath, cough or sputum production.   Cardiovascular: Denies chest pain, chest tightness, palpitations or swelling in the hands or feet.  Musculoskeletal: Pt reports intermittent low back pain. Denies decrease in range of motion, difficulty with gait, or joint swelling.  Neurological: Denies numbness, tingling, weakness or problems with balance and coordination.    No other specific complaints in a complete review of systems (except as listed in HPI above).  Objective:   Physical Exam  BP 126/78   Pulse 97   Temp  97.9 F (36.6 C) (Temporal)   Wt (!) 254 lb (115.2 kg)   SpO2 98%   BMI 35.43 kg/m   Wt Readings from Last 3 Encounters:  11/25/19 245 lb (111.1 kg)  06/17/19 236 lb 8 oz (107.3 kg)  03/29/19 230 lb (104.3 kg)    General: Appears their stated age, obese, in NAD. Skin: Warm, dry and intact. No rashes noted. Cardiovascular: Normal rate and rhythm. S1,S2 noted.  No murmur, rubs or gallops noted.  Pulmonary/Chest: Normal effort and positive vesicular breath sounds. No respiratory distress. No wheezes, rales or ronchi noted.  Musculoskeletal: Normal flexion, extension and rotation of the spine.  No bony tenderness noted over the spine.  Strength 5/5 BLE.Marland Kitchen No difficulty with gait.  Neurological: Alert and oriented.  BMET    Component Value Date/Time   NA 140 05/28/2018 0838   K 3.5 05/28/2018  0838   CL 105 05/28/2018 0838   CO2 28 05/28/2018 0838   GLUCOSE 106 (H) 05/28/2018 0838   BUN 12 05/28/2018 0838   CREATININE 1.01 05/28/2018 0838   CALCIUM 8.8 05/28/2018 0838    Lipid Panel     Component Value Date/Time   CHOL 95 05/28/2018 0838   TRIG 62.0 05/28/2018 0838   HDL 33.20 (L) 05/28/2018 0838   CHOLHDL 3 05/28/2018 0838   VLDL 12.4 05/28/2018 0838   LDLCALC 49 05/28/2018 0838    CBC    Component Value Date/Time   WBC 6.6 09/04/2018 0902   RBC 4.54 09/04/2018 0902   HGB 12.6 (L) 09/04/2018 0902   HCT 36.8 (L) 09/04/2018 0902   PLT 173.0 09/04/2018 0902   MCV 81.1 09/04/2018 0902   MCHC 34.2 09/04/2018 0902   RDW 17.9 (H) 09/04/2018 0902    Hgb A1C No results found for: HGBA1C          Assessment & Plan:    Nicki Reaper, NP This visit occurred during the SARS-CoV-2 public health emergency.  Safety protocols were in place, including screening questions prior to the visit, additional usage of staff PPE, and extensive cleaning of exam room while observing appropriate contact time as indicated for disinfecting solutions.

## 2020-03-10 NOTE — Telephone Encounter (Signed)
Left message asking pt to call office Also sent my chart message

## 2020-03-15 NOTE — Telephone Encounter (Signed)
Left message asking pt to call office  °

## 2020-05-09 ENCOUNTER — Other Ambulatory Visit: Payer: Self-pay

## 2020-05-09 ENCOUNTER — Telehealth (INDEPENDENT_AMBULATORY_CARE_PROVIDER_SITE_OTHER): Payer: 59 | Admitting: Internal Medicine

## 2020-05-09 ENCOUNTER — Encounter: Payer: Self-pay | Admitting: Internal Medicine

## 2020-05-09 DIAGNOSIS — B9789 Other viral agents as the cause of diseases classified elsewhere: Secondary | ICD-10-CM

## 2020-05-09 DIAGNOSIS — J329 Chronic sinusitis, unspecified: Secondary | ICD-10-CM

## 2020-05-09 MED ORDER — PREDNISONE 10 MG PO TABS: ORAL_TABLET | ORAL | 0 refills | Status: DC

## 2020-05-09 NOTE — Patient Instructions (Signed)

## 2020-05-09 NOTE — Progress Notes (Signed)
Virtual Visit via Video Note  I connected with Patrick Hahn on 05/09/20 at  3:00 PM EDT by a video enabled telemedicine application and verified that I am speaking with the correct person using two identifiers.  Location: Patient: Home Provider: Office  Persons participating in this video call: Nicki Reaper, NP-see and Patrick Hahn   I discussed the limitations of evaluation and management by telemedicine and the availability of in person appointments. The patient expressed understanding and agreed to proceed.  History of Present Illness:  Patient reports fatigue, fever, headache, eye pain, nasal congestion and cough.  This started 5 days ago. The headache is located behind his left eye and in his forehead. He describes the pain as pressure. He reports some associated dizziness, but this has improved. He denies visual changes. He is blowing clear mucous out of his nose. He denies ear pain, loss of taste/smell or SOB. The cough is mostly nonproductive. He reports he ran a fever yesterday, but denies chills or body aches. He has take Mucinex PM with minimal relief of symptoms. He has not had exposure to Covid that he is aware of. He has had his Covid vaccine.   No past medical history on file.  No current outpatient medications on file.   No current facility-administered medications for this visit.    No Known Allergies  Family History  Problem Relation Age of Onset  . Cancer Maternal Uncle        throat  . Diabetes Neg Hx   . Early death Neg Hx   . Heart disease Neg Hx   . Stroke Neg Hx     Social History   Socioeconomic History  . Marital status: Married    Spouse name: Not on file  . Number of children: Not on file  . Years of education: Not on file  . Highest education level: Not on file  Occupational History  . Not on file  Tobacco Use  . Smoking status: Never Smoker  . Smokeless tobacco: Never Used  Substance and Sexual Activity  . Alcohol use: Yes     Alcohol/week: 1.0 standard drink    Types: 1 Shots of liquor per week    Comment: occasional  . Drug use: No  . Sexual activity: Yes  Other Topics Concern  . Not on file  Social History Narrative  . Not on file   Social Determinants of Health   Financial Resource Strain:   . Difficulty of Paying Living Expenses: Not on file  Food Insecurity:   . Worried About Programme researcher, broadcasting/film/video in the Last Year: Not on file  . Ran Out of Food in the Last Year: Not on file  Transportation Needs:   . Lack of Transportation (Medical): Not on file  . Lack of Transportation (Non-Medical): Not on file  Physical Activity:   . Days of Exercise per Week: Not on file  . Minutes of Exercise per Session: Not on file  Stress:   . Feeling of Stress : Not on file  Social Connections:   . Frequency of Communication with Friends and Family: Not on file  . Frequency of Social Gatherings with Friends and Family: Not on file  . Attends Religious Services: Not on file  . Active Member of Clubs or Organizations: Not on file  . Attends Banker Meetings: Not on file  . Marital Status: Not on file  Intimate Partner Violence:   . Fear of Current or Ex-Partner: Not on  file  . Emotionally Abused: Not on file  . Physically Abused: Not on file  . Sexually Abused: Not on file     Constitutional: Patient reports fatigue, fever and headache but denies abrupt weight changes.  HEENT: Patient reports eye pain, facial pain and nasal congestion.  Denies  eye redness, ear pain, ringing in the ears, wax buildup, runny nose, bloody nose, or sore throat. Respiratory: Pt reports cough. Denies difficulty breathing, shortness of breath, or sputum production.   Cardiovascular: Denies chest pain, chest tightness, palpitations or swelling in the hands or feet.  Neurological: Pt reports dizziness. Denies difficulty with memory, difficulty with speech or problems with balance and coordination.    No other specific  complaints in a complete review of systems (except as listed in HPI above).    Observations/Objective:  Wt Readings from Last 3 Encounters:  03/09/20 (!) 254 lb (115.2 kg)  11/25/19 245 lb (111.1 kg)  06/17/19 236 lb 8 oz (107.3 kg)    General: Appears his stated age, in NAD. HEENT: Head: normal shape and size, frontal sinus tenderness noted; Nose: congestion noted; Throat/Mouth: hoarseness noted. Pulmonary/Chest: Normal effort. No respiratory distress. Neurological: Alert and oriented.  BMET    Component Value Date/Time   NA 140 05/28/2018 0838   K 3.5 05/28/2018 0838   CL 105 05/28/2018 0838   CO2 28 05/28/2018 0838   GLUCOSE 106 (H) 05/28/2018 0838   BUN 12 05/28/2018 0838   CREATININE 1.01 05/28/2018 0838   CALCIUM 8.8 05/28/2018 0838    Lipid Panel     Component Value Date/Time   CHOL 95 05/28/2018 0838   TRIG 62.0 05/28/2018 0838   HDL 33.20 (L) 05/28/2018 0838   CHOLHDL 3 05/28/2018 0838   VLDL 12.4 05/28/2018 0838   LDLCALC 49 05/28/2018 0838    CBC    Component Value Date/Time   WBC 6.6 09/04/2018 0902   RBC 4.54 09/04/2018 0902   HGB 12.6 (L) 09/04/2018 0902   HCT 36.8 (L) 09/04/2018 0902   PLT 173.0 09/04/2018 0902   MCV 81.1 09/04/2018 0902   MCHC 34.2 09/04/2018 0902   RDW 17.9 (H) 09/04/2018 0902    Hgb A1C No results found for: HGBA1C      Assessment and Plan:  Viral Sinusitis:  RX for Pred Taper x 6 days Continue Mucinex If worse, would consider getting covid testing No indication for abx at this time but would consider Friday if no improvement  Return precautions discussed  Follow Up Instructions:    I discussed the assessment and treatment plan with the patient. The patient was provided an opportunity to ask questions and all were answered. The patient agreed with the plan and demonstrated an understanding of the instructions.   The patient was advised to call back or seek an in-person evaluation if the symptoms worsen or  if the condition fails to improve as anticipated.   Nicki Reaper, NP

## 2020-05-10 ENCOUNTER — Encounter: Payer: Self-pay | Admitting: Internal Medicine

## 2020-05-11 ENCOUNTER — Ambulatory Visit: Payer: 59 | Admitting: Internal Medicine

## 2020-05-15 ENCOUNTER — Encounter: Payer: Self-pay | Admitting: Internal Medicine

## 2020-05-15 ENCOUNTER — Telehealth (INDEPENDENT_AMBULATORY_CARE_PROVIDER_SITE_OTHER): Payer: 59 | Admitting: Internal Medicine

## 2020-05-15 DIAGNOSIS — R066 Hiccough: Secondary | ICD-10-CM | POA: Diagnosis not present

## 2020-05-15 NOTE — Patient Instructions (Signed)
Hiccups  A hiccup is the result of a sudden irritation of a muscle that is used for breathing (diaphragm). The diaphragm is located under your lungs and above your stomach. When the diaphragm gets irritated, it may quickly tighten without your control (have a spasm). The spasm causes you to quickly suck in air, and that causes your vocal cords to close together quickly. These reactions cause the hiccup sound. Hiccups usually last only a short amount of time (less than 48 hours). In unusual cases, they can last for days or months and require you to see your health care provider. Common causes of hiccups include:  Eating too quickly.  Eating too much food.  Drinking alcohol or bubbly (carbonated) drinks.  Eating or drinking hot or spicy foods and drinks.  Swallowing extra air when sucking on candy or a straw or chewing on gum.  Feeling nervous, stressed, or excited.  Having certain conditions that irritate the diaphragm nerves.  Having metabolic or nervous system disorders. Follow these instructions at home:  Watch your hiccups for any changes.  To prevent hiccups or to lessen discomfort from hiccups: ? Eat and chew your food slowly. ? Eat small meals, and avoid overeating. ? Limit alcohol intake to no more than 1 drink a day for nonpregnant women and 2 drinks a day for men. One drink equals 12 oz of beer, 5 oz of wine, or 1 oz of hard liquor. ? Limit your drinking of carbonated or fizzy drinks, such as soda. ? Avoid eating or drinking hot or spicy foods and drinks.  Take over-the-counter and prescription medicines only as told by your health care provider. Contact a health care provider if:  Your hiccups last for more than 48 hours.  Your hiccups do not improve with treatment.  You cannot sleep or eat because of the hiccups.  You have unexpected weight loss due to the hiccups.  You have numbness, tingling, or weakness. Get help right away if:  You have trouble breathing  or swallowing.  You have severe pain in your abdomen. Summary  A hiccup is the result of a sudden irritation of a muscle that is used for breathing (diaphragm).  Hiccups can be caused by many things, including eating too quickly.  See your health care provider if your hiccups last for more than 48 hours. This information is not intended to replace advice given to you by your health care provider. Make sure you discuss any questions you have with your health care provider. Document Revised: 07/11/2017 Document Reviewed: 04/07/2017 Elsevier Patient Education  2020 [employer]  

## 2020-05-15 NOTE — Progress Notes (Signed)
Virtual Visit via Video Note  I connected with Patrick Hahn on 05/15/20 at  3:45 PM EDT by a video enabled telemedicine application and verified that I am speaking with the correct person using two identifiers.  Location: Patient: Home Provider: Office   I discussed the limitations of evaluation and management by telemedicine and the availability of in person appointments. The patient expressed understanding and agreed to proceed.  Person's participating in this video call: Nicki Reaper, NP and Patrick Hahn.  History of Present Illness:  Pt reports intractable hiccups. He reports this occurred from 9/29-10-3. He missed work because of this. He was finally able to make them stop by consuming honey.  No past medical history on file.  Current Outpatient Medications  Medication Sig Dispense Refill  . predniSONE (DELTASONE) 10 MG tablet Take 6 tabs day 1, 5 tabs day 2, 4 tabs day 3, 3 tabs day 4, 2 tabs day 5, 1 tab day 6 21 tablet 0   No current facility-administered medications for this visit.    No Known Allergies  Family History  Problem Relation Age of Onset  . Cancer Maternal Uncle        throat  . Diabetes Neg Hx   . Early death Neg Hx   . Heart disease Neg Hx   . Stroke Neg Hx     Social History   Socioeconomic History  . Marital status: Married    Spouse name: Not on file  . Number of children: Not on file  . Years of education: Not on file  . Highest education level: Not on file  Occupational History  . Not on file  Tobacco Use  . Smoking status: Never Smoker  . Smokeless tobacco: Never Used  Substance and Sexual Activity  . Alcohol use: Yes    Alcohol/week: 1.0 standard drink    Types: 1 Shots of liquor per week    Comment: occasional  . Drug use: No  . Sexual activity: Yes  Other Topics Concern  . Not on file  Social History Narrative  . Not on file   Social Determinants of Health   Financial Resource Strain:   . Difficulty of Paying Living  Expenses: Not on file  Food Insecurity:   . Worried About Programme researcher, broadcasting/film/video in the Last Year: Not on file  . Ran Out of Food in the Last Year: Not on file  Transportation Needs:   . Lack of Transportation (Medical): Not on file  . Lack of Transportation (Non-Medical): Not on file  Physical Activity:   . Days of Exercise per Week: Not on file  . Minutes of Exercise per Session: Not on file  Stress:   . Feeling of Stress : Not on file  Social Connections:   . Frequency of Communication with Friends and Family: Not on file  . Frequency of Social Gatherings with Friends and Family: Not on file  . Attends Religious Services: Not on file  . Active Member of Clubs or Organizations: Not on file  . Attends Banker Meetings: Not on file  . Marital Status: Not on file  Intimate Partner Violence:   . Fear of Current or Ex-Partner: Not on file  . Emotionally Abused: Not on file  . Physically Abused: Not on file  . Sexually Abused: Not on file     Constitutional: Denies fever, malaise, fatigue, headache or abrupt weight changes.  HEENT: Pt reports hiccups. Denies eye pain, eye redness, ear pain, ringing  in the ears, wax buildup, runny nose, nasal congestion, bloody nose, or sore throat. Respiratory: Denies difficulty breathing, shortness of breath, cough or sputum production.   Cardiovascular: Denies chest pain, chest tightness, palpitations or swelling in the hands or feet.   No other specific complaints in a complete review of systems (except as listed in HPI above).   Observations/Objective:   Wt Readings from Last 3 Encounters:  03/09/20 (!) 254 lb (115.2 kg)  11/25/19 245 lb (111.1 kg)  06/17/19 236 lb 8 oz (107.3 kg)    General: Appears his stated age, well developed, well nourished in NAD. HEENT: Head: normal shape and size; Nose: no congestion noted ; Throat/Mouth: no hoarseness noted. Pulmonary/Chest: Normal effort. No respiratory distress.  Neurological: Alert  and oriented.   BMET    Component Value Date/Time   NA 140 05/28/2018 0838   K 3.5 05/28/2018 0838   CL 105 05/28/2018 0838   CO2 28 05/28/2018 0838   GLUCOSE 106 (H) 05/28/2018 0838   BUN 12 05/28/2018 0838   CREATININE 1.01 05/28/2018 0838   CALCIUM 8.8 05/28/2018 0838    Lipid Panel     Component Value Date/Time   CHOL 95 05/28/2018 0838   TRIG 62.0 05/28/2018 0838   HDL 33.20 (L) 05/28/2018 0838   CHOLHDL 3 05/28/2018 0838   VLDL 12.4 05/28/2018 0838   LDLCALC 49 05/28/2018 0838    CBC    Component Value Date/Time   WBC 6.6 09/04/2018 0902   RBC 4.54 09/04/2018 0902   HGB 12.6 (L) 09/04/2018 0902   HCT 36.8 (L) 09/04/2018 0902   PLT 173.0 09/04/2018 0902   MCV 81.1 09/04/2018 0902   MCHC 34.2 09/04/2018 0902   RDW 17.9 (H) 09/04/2018 0902    Hgb A1C No results found for: HGBA1C     Assessment and Plan:  Intractable Hiccups:P  Resolved Work note provided  Return precautions discussed  Follow Up Instructions:    I discussed the assessment and treatment plan with the patient. The patient was provided an opportunity to ask questions and all were answered. The patient agreed with the plan and demonstrated an understanding of the instructions.   The patient was advised to call back or seek an in-person evaluation if the symptoms worsen or if the condition fails to improve as anticipated.    Nicki Reaper, NP

## 2020-06-12 ENCOUNTER — Ambulatory Visit: Payer: 59 | Admitting: Internal Medicine

## 2020-06-14 ENCOUNTER — Ambulatory Visit (INDEPENDENT_AMBULATORY_CARE_PROVIDER_SITE_OTHER): Payer: 59 | Admitting: Internal Medicine

## 2020-06-14 ENCOUNTER — Encounter: Payer: Self-pay | Admitting: Internal Medicine

## 2020-06-14 ENCOUNTER — Other Ambulatory Visit: Payer: Self-pay

## 2020-06-14 VITALS — BP 118/70 | HR 86 | Temp 97.7°F | Wt 249.0 lb

## 2020-06-14 DIAGNOSIS — M79645 Pain in left finger(s): Secondary | ICD-10-CM

## 2020-06-14 DIAGNOSIS — R635 Abnormal weight gain: Secondary | ICD-10-CM

## 2020-06-14 DIAGNOSIS — M25561 Pain in right knee: Secondary | ICD-10-CM | POA: Diagnosis not present

## 2020-06-14 NOTE — Patient Instructions (Addendum)
Diet should consist mostly of protein and veggies. Avoid sugar, carbs, fruit, bread, pasta and processed foods. Consume less than 1400 calories a day.  You can take Aleve daily as needed for joint pain, let me know if worsens and we can xray the right knee pain and left hand.  Have Tyana use boric acid supporities for 2-3 days after intercourse.

## 2020-06-14 NOTE — Progress Notes (Signed)
Subjective:    Patient ID: Patrick Hahn, male    DOB: July 29, 1981, 39 y.o.   MRN: [id number]  HPI  Pt presents to the clinic today with c/o right knee pain. This started about 1 month ago. He describes the pain as tightness and swelling. He denies redness, warmth, numbness, tingling or weakness. He denies any injury to the area. He has not taken anything OTC for this.  He also reports pain at the base of his left thumb. He noticed this 2 weeks ago. The pain is constant, achy. He denies numbness, tingling or weakness. He denies any injury to the area. He does game a lot, not sure if this is a contributing factor.  He also reports his wife is getting recurrent bacterial vaginosis after intercourse. He is not sure if it is something he is doing or not. He reports they are not using toys.   Review of Systems  No past medical history on file.  No current outpatient medications on file.   No current facility-administered medications for this visit.    No Known Allergies  Family History  Problem Relation Age of Onset  . Cancer Maternal Uncle        throat  . Diabetes Neg Hx   . Early death Neg Hx   . Heart disease Neg Hx   . Stroke Neg Hx     Social History   Socioeconomic History  . Marital status: Married    Spouse name: Not on file  . Number of children: Not on file  . Years of education: Not on file  . Highest education level: Not on file  Occupational History  . Not on file  Tobacco Use  . Smoking status: Never Smoker  . Smokeless tobacco: Never Used  Substance and Sexual Activity  . Alcohol use: Yes    Alcohol/week: 1.0 standard drink    Types: 1 Shots of liquor per week    Comment: occasional  . Drug use: No  . Sexual activity: Yes  Other Topics Concern  . Not on file  Social History Narrative  . Not on file   Social Determinants of Health   Financial Resource Strain:   . Difficulty of Paying Living Expenses: Not on file  Food Insecurity:   . Worried  About Programme researcher, broadcasting/film/video in the Last Year: Not on file  . Ran Out of Food in the Last Year: Not on file  Transportation Needs:   . Lack of Transportation (Medical): Not on file  . Lack of Transportation (Non-Medical): Not on file  Physical Activity:   . Days of Exercise per Week: Not on file  . Minutes of Exercise per Session: Not on file  Stress:   . Feeling of Stress : Not on file  Social Connections:   . Frequency of Communication with Friends and Family: Not on file  . Frequency of Social Gatherings with Friends and Family: Not on file  . Attends Religious Services: Not on file  . Active Member of Clubs or Organizations: Not on file  . Attends Banker Meetings: Not on file  . Marital Status: Not on file  Intimate Partner Violence:   . Fear of Current or Ex-Partner: Not on file  . Emotionally Abused: Not on file  . Physically Abused: Not on file  . Sexually Abused: Not on file     Constitutional: Denies fever, malaise, fatigue, headache or abrupt weight changes.  Respiratory: Denies difficulty breathing, shortness of  breath, cough or sputum production.   Cardiovascular: Denies chest pain, chest tightness, palpitations or swelling in the hands or feet.  Musculoskeletal: Pt reports right knee pain, pain at base of left thumb. Denies decrease in range of motion, difficulty with gait, muscle pain or joint swelling.  Neurological: Denies numbness, tingling, weakness or problems with balance and coordination.   No other specific complaints in a complete review of systems (except as listed in HPI above).     Objective:   Physical Exam  BP 118/70 (BP Location: Left Arm, Patient Position: Sitting, Cuff Size: Large)   Pulse 86   Temp 97.7 F (36.5 C) (Temporal)   Wt 249 lb (112.9 kg)   SpO2 99%   BMI 34.73 kg/m   Wt Readings from Last 3 Encounters:  03/09/20 (!) 254 lb (115.2 kg)  11/25/19 245 lb (111.1 kg)  06/17/19 236 lb 8 oz (107.3 kg)    General:  Appears his stated age, obese, in NAD. Cardiovascular: Normal rate. Pulmonary/Chest: Normal effort. Musculoskeletal: Normal extension, pain with full flexion. Some pain with palpation of the medial joint line. Swelling over the medial pes bursa but no pain with palpation. Strength 5/5 BLE. Normal flexion and extension of the left thumb. Small mobile mass noted at the base of the left thumb, ? Ganglion cyst or bone fragment. No difficulty with gait.  Neurological: Alert and oriented.    BMET    Component Value Date/Time   NA 140 05/28/2018 0838   K 3.5 05/28/2018 0838   CL 105 05/28/2018 0838   CO2 28 05/28/2018 0838   GLUCOSE 106 (H) 05/28/2018 0838   BUN 12 05/28/2018 0838   CREATININE 1.01 05/28/2018 0838   CALCIUM 8.8 05/28/2018 0838    Lipid Panel     Component Value Date/Time   CHOL 95 05/28/2018 0838   TRIG 62.0 05/28/2018 0838   HDL 33.20 (L) 05/28/2018 0838   CHOLHDL 3 05/28/2018 0838   VLDL 12.4 05/28/2018 0838   LDLCALC 49 05/28/2018 0838    CBC    Component Value Date/Time   WBC 6.6 09/04/2018 0902   RBC 4.54 09/04/2018 0902   HGB 12.6 (L) 09/04/2018 0902   HCT 36.8 (L) 09/04/2018 0902   PLT 173.0 09/04/2018 0902   MCV 81.1 09/04/2018 0902   MCHC 34.2 09/04/2018 0902   RDW 17.9 (H) 09/04/2018 0902    Hgb A1C No results found for: HGBA1C          Assessment & Plan:  Right Knee Pain:  Could have mild OA but will hold off on xray unless worsens Try Aleve as needed- consume with food Discussed this could be exacerbated by weight, he is working on weight loss  Left Thumb Pain:  Cyst vs bony fragment vs OA Try Aleve as needed- consume with food Consider xray if pain persist or worsens  Abnormal Weight Gain:  Discussed low carb, high protein diet Avoid sugars, carbs, processed foods Don't drink your calories, consume mainly water Consume 1400 calories per day Light exercise for at least 90 minutes a week   RTC as needed or if symptoms  persist or worsen Nicki Reaper, NP This visit occurred during the SARS-CoV-2 public health emergency.  Safety protocols were in place, including screening questions prior to the visit, additional usage of staff PPE, and extensive cleaning of exam room while observing appropriate contact time as indicated for disinfecting solutions.

## 2020-09-12 ENCOUNTER — Encounter: Payer: Self-pay | Admitting: Internal Medicine

## 2020-09-12 ENCOUNTER — Ambulatory Visit (INDEPENDENT_AMBULATORY_CARE_PROVIDER_SITE_OTHER): Payer: Federal, State, Local not specified - PPO | Admitting: Internal Medicine

## 2020-09-12 ENCOUNTER — Other Ambulatory Visit: Payer: Self-pay

## 2020-09-12 VITALS — BP 124/76 | HR 100 | Temp 98.7°F | Ht 71.0 in | Wt 217.0 lb

## 2020-09-12 DIAGNOSIS — M545 Low back pain, unspecified: Secondary | ICD-10-CM

## 2020-09-12 DIAGNOSIS — R7309 Other abnormal glucose: Secondary | ICD-10-CM

## 2020-09-12 DIAGNOSIS — G8929 Other chronic pain: Secondary | ICD-10-CM

## 2020-09-12 DIAGNOSIS — Z0001 Encounter for general adult medical examination with abnormal findings: Secondary | ICD-10-CM

## 2020-09-12 DIAGNOSIS — Z3009 Encounter for other general counseling and advice on contraception: Secondary | ICD-10-CM

## 2020-09-12 DIAGNOSIS — Z113 Encounter for screening for infections with a predominantly sexual mode of transmission: Secondary | ICD-10-CM

## 2020-09-12 MED ORDER — TRIAMCINOLONE ACETONIDE 0.1 % EX CREA: 1.0000 "application " | TOPICAL_CREAM | Freq: Two times a day (BID) | CUTANEOUS | 2 refills | Status: DC

## 2020-09-12 NOTE — Assessment & Plan Note (Signed)
Encouraged daily stretching and core strengthening

## 2020-09-12 NOTE — Progress Notes (Signed)
Subjective:    Patient ID: Patrick Hahn, male    DOB: 1980-10-05, 40 y.o.   MRN: [id number]  HPI  Pt presents to the clinic today for his annual exam.  Chronic Back Pain: Managed with Ibuprofen and stretching. He reports this flares from time to time, currently not an issue.  Flu: never Tetanus: 02/2016 Covid: Pfizer x 2 Dentist: annually  Diet: He does eat meat. He consumes some fruits and veggies. He does eat fried foods. He drinks mostly. Exercise:  Review of Systems      No past medical history on file.  No current outpatient medications on file.   No current facility-administered medications for this visit.    No Known Allergies  Family History  Problem Relation Age of Onset  . Cancer Maternal Uncle        throat  . Diabetes Neg Hx   . Early death Neg Hx   . Heart disease Neg Hx   . Stroke Neg Hx     Social History   Socioeconomic History  . Marital status: Married    Spouse name: Not on file  . Number of children: Not on file  . Years of education: Not on file  . Highest education level: Not on file  Occupational History  . Not on file  Tobacco Use  . Smoking status: Never Smoker  . Smokeless tobacco: Never Used  Substance and Sexual Activity  . Alcohol use: Yes    Alcohol/week: 1.0 standard drink    Types: 1 Shots of liquor per week    Comment: occasional  . Drug use: No  . Sexual activity: Yes  Other Topics Concern  . Not on file  Social History Narrative  . Not on file   Social Determinants of Health   Financial Resource Strain: Not on file  Food Insecurity: Not on file  Transportation Needs: Not on file  Physical Activity: Not on file  Stress: Not on file  Social Connections: Not on file  Intimate Partner Violence: Not on file     Constitutional: Denies fever, malaise, fatigue, headache or abrupt weight changes.  HEENT: Denies eye pain, eye redness, ear pain, ringing in the ears, wax buildup, runny nose, nasal congestion,  bloody nose, or sore throat. Respiratory: Denies difficulty breathing, shortness of breath, cough or sputum production.   Cardiovascular: Denies chest pain, chest tightness, palpitations or swelling in the hands or feet.  Gastrointestinal: Denies abdominal pain, bloating, constipation, diarrhea or blood in the stool.  GU: Denies urgency, frequency, pain with urination, burning sensation, blood in urine, odor or discharge. Musculoskeletal: Pt reports intermittent back pain. Denies decrease in range of motion, difficulty with gait, muscle pain or joint swelling.  Skin: Denies redness, rashes, lesions or ulcercations.  Neurological: Denies dizziness, difficulty with memory, difficulty with speech or problems with balance and coordination.  Psych: Denies anxiety, depression, SI/HI.  No other specific complaints in a complete review of systems (except as listed in HPI above).  Objective:   Physical Exam   Temp 98.7 F (37.1 C) (Temporal)   Ht 5\' 11"  (1.803 m)   Wt 217 lb (98.4 kg)   BMI 30.27 kg/m   Wt Readings from Last 3 Encounters:  06/14/20 249 lb (112.9 kg)  03/09/20 (!) 254 lb (115.2 kg)  11/25/19 245 lb (111.1 kg)    General: Appears his stated age, well developed, well nourished in NAD. Skin: Warm, dry and intact. No rashes, lesions or ulcerations noted. HEENT: Head:  normal shape and size; Eyes: sclera white, no icterus, conjunctiva pink, PERRLA and EOMs intact;  Neck:  Neck supple, trachea midline. No masses, lumps or thyromegaly present.  Cardiovascular: Normal rate and rhythm. S1,S2 noted.  No murmur, rubs or gallops noted. No JVD or BLE edema.  Pulmonary/Chest: Normal effort and positive vesicular breath sounds. No respiratory distress. No wheezes, rales or ronchi noted.  Abdomen: Soft and nontender. Normal bowel sounds. No distention or masses noted. Liver, spleen and kidneys non palpable. Musculoskeletal: Strength 5/5 BUE/BLE. No difficulty with gait.  Neurological:  Alert and oriented. Cranial nerves II-XII grossly intact. Coordination normal.  Psychiatric: Mood and affect normal. Behavior is normal. Judgment and thought content normal.     BMET    Component Value Date/Time   NA 140 05/28/2018 0838   K 3.5 05/28/2018 0838   CL 105 05/28/2018 0838   CO2 28 05/28/2018 0838   GLUCOSE 106 (H) 05/28/2018 0838   BUN 12 05/28/2018 0838   CREATININE 1.01 05/28/2018 0838   CALCIUM 8.8 05/28/2018 0838    Lipid Panel     Component Value Date/Time   CHOL 95 05/28/2018 0838   TRIG 62.0 05/28/2018 0838   HDL 33.20 (L) 05/28/2018 0838   CHOLHDL 3 05/28/2018 0838   VLDL 12.4 05/28/2018 0838   LDLCALC 49 05/28/2018 0838    CBC    Component Value Date/Time   WBC 6.6 09/04/2018 0902   RBC 4.54 09/04/2018 0902   HGB 12.6 (L) 09/04/2018 0902   HCT 36.8 (L) 09/04/2018 0902   PLT 173.0 09/04/2018 0902   MCV 81.1 09/04/2018 0902   MCHC 34.2 09/04/2018 0902   RDW 17.9 (H) 09/04/2018 0902    Hgb A1C No results found for: HGBA1C         Assessment & Plan:   Preventative Health Maintenance:  He declines flu shot today Tetanus UTD Encouraged him to get his covid booster Encouraged him to consume a balanced diet and exercise regimen Advised him to see an eye doctor and dentist annually Will check CBC, CMET, Lipid, A1C  today  RTC in 1 year, sooner if needed Nicki Reaper, NP This visit occurred during the SARS-CoV-2 public health emergency.  Safety protocols were in place, including screening questions prior to the visit, additional usage of staff PPE, and extensive cleaning of exam room while observing appropriate contact time as indicated for disinfecting solutions.

## 2020-09-12 NOTE — Patient Instructions (Signed)

## 2020-09-13 ENCOUNTER — Other Ambulatory Visit: Payer: Federal, State, Local not specified - PPO

## 2020-09-13 DIAGNOSIS — Z0001 Encounter for general adult medical examination with abnormal findings: Secondary | ICD-10-CM | POA: Diagnosis not present

## 2020-09-13 LAB — COMPREHENSIVE METABOLIC PANEL
ALT: 13 U/L (ref 0–53)
AST: 15 U/L (ref 0–37)
Albumin: 4.2 g/dL (ref 3.5–5.2)
Alkaline Phosphatase: 57 U/L (ref 39–117)
BUN: 15 mg/dL (ref 6–23)
CO2: 28 mEq/L (ref 19–32)
Calcium: 9 mg/dL (ref 8.4–10.5)
Chloride: 103 mEq/L (ref 96–112)
Creatinine, Ser: 1.08 mg/dL (ref 0.40–1.50)
GFR: 87 mL/min (ref >60.00)
Glucose, Bld: 80 mg/dL (ref 70–99)
Potassium: 3.5 mEq/L (ref 3.5–5.1)
Sodium: 138 mEq/L (ref 135–145)
Total Bilirubin: 1.9 mg/dL — ABNORMAL HIGH (ref 0.2–1.2)
Total Protein: 7.1 g/dL (ref 6.0–8.3)

## 2020-09-13 LAB — CBC
HCT: 34 % — ABNORMAL LOW (ref 39.0–52.0)
Hemoglobin: 12 g/dL — ABNORMAL LOW (ref 13.0–17.0)
MCHC: 35.3 g/dL (ref 30.0–36.0)
MCV: 78.3 fl (ref 78.0–100.0)
Platelets: 118 10*3/uL — ABNORMAL LOW (ref 150.0–400.0)
RBC: 4.34 Mil/uL (ref 4.22–5.81)
RDW: 18.5 % — ABNORMAL HIGH (ref 11.5–15.5)
WBC: 7.4 10*3/uL (ref 4.0–10.5)

## 2020-09-13 LAB — LIPID PANEL
Cholesterol: 114 mg/dL (ref 0–200)
HDL: 32.9 mg/dL — ABNORMAL LOW (ref >39.00)
LDL Cholesterol: 64 mg/dL (ref 0–99)
NonHDL: 80.7
Total CHOL/HDL Ratio: 3
Triglycerides: 82 mg/dL (ref 0.0–149.0)
VLDL: 16.4 mg/dL (ref 0.0–40.0)

## 2020-09-14 LAB — C. TRACHOMATIS/N. GONORRHOEAE RNA
C. trachomatis RNA, TMA: NOT DETECTED
N. gonorrhoeae RNA, TMA: NOT DETECTED

## 2020-09-14 LAB — HEMOGLOBIN A1C
Hgb A1c MFr Bld: 4.1 % of total Hgb (ref <5.7)
Mean Plasma Glucose: 71 mg/dL
eAG (mmol/L): 3.9 mmol/L

## 2020-09-14 LAB — HEPATITIS C ANTIBODY
Hepatitis C Ab: NONREACTIVE
SIGNAL TO CUT-OFF: 0.03 (ref <1.00)

## 2020-09-14 LAB — HIV ANTIBODY (ROUTINE TESTING W REFLEX): HIV 1&2 Ab, 4th Generation: NONREACTIVE

## 2020-09-14 LAB — RPR: RPR Ser Ql: NONREACTIVE

## 2020-09-14 LAB — TRICHOMONAS VAGINALIS RNA, QL,MALES: Trichomonas vaginalis RNA: NOT DETECTED

## 2020-09-19 NOTE — Progress Notes (Signed)
Order(s) created erroneously. Erroneous order ID: [id number]  Order moved by: Lemarcus Baggerly Y  Order move date/time: 09/19/2020 1:48 PM  Source Patient: [id number]  Source Contact: 09/12/2020  Destination Patient: [id number]  Destination Contact: 03/30/2020 

## 2020-09-27 ENCOUNTER — Other Ambulatory Visit: Payer: Self-pay

## 2020-09-27 ENCOUNTER — Ambulatory Visit (INDEPENDENT_AMBULATORY_CARE_PROVIDER_SITE_OTHER): Payer: Federal, State, Local not specified - PPO | Admitting: Urology

## 2020-09-27 ENCOUNTER — Encounter: Payer: Self-pay | Admitting: Urology

## 2020-09-27 VITALS — BP 126/75 | HR 87 | Ht 71.0 in | Wt 213.0 lb

## 2020-09-27 DIAGNOSIS — Z3009 Encounter for other general counseling and advice on contraception: Secondary | ICD-10-CM | POA: Diagnosis not present

## 2020-09-27 MED ORDER — DIAZEPAM 10 MG PO TABS: 10.0000 mg | ORAL_TABLET | Freq: Once | ORAL | 0 refills | Status: AC

## 2020-09-27 NOTE — Progress Notes (Signed)
09/27/2020 3:37 PM   Patrick Hahn [birth year] [id number]  Referring provider: Lorre Munroe, NP [address] [place],  [place] [address]  Chief Complaint  Patient presents with  . VAS Consult    HPI: 40 y.o. year old male referred for further evaluation of possible vasectomy.  He denies a history of testicular trauma or pain.  No urinary issues.  No previous scrotal surgeries.  He has 4 children ranging from age 60 to 34.  He desires no further biological children.     PMH: No past medical history on file.  Surgical History: Past Surgical History:  Procedure Laterality Date  . GYNECOMASTIA EXCISION     40 years old    Home Medications:  Allergies as of 09/27/2020   No Known Allergies     Medication List       Accurate as of September 27, 2020  3:37 PM. If you have any questions, ask your nurse or doctor.        diazepam 10 MG tablet Commonly known as: Valium Take 1 tablet (10 mg total) by mouth once for 1 dose. Take 1 hour prior to procedure. Please have a driver available. Started by: Vanna Scotland, MD   triamcinolone 0.1 % Commonly known as: KENALOG Apply 1 application topically 2 (two) times daily.       Allergies: No Known Allergies  Family History: Family History  Problem Relation Age of Onset  . Cancer Maternal Uncle        throat  . Prostate cancer Father   . Diabetes Neg Hx   . Early death Neg Hx   . Heart disease Neg Hx   . Stroke Neg Hx     Social History:  reports that he has never smoked. He has never used smokeless tobacco. He reports current alcohol use of about 1.0 standard drink of alcohol per week. He reports that he does not use drugs.  Physical Exam: BP 126/75   Pulse 87   Ht 5\' 11"  (1.803 m)   Wt 213 lb (96.6 kg)   BMI 29.71 kg/m   Constitutional:  Alert and oriented, No acute distress. HEENT: [place] AT, moist mucus membranes.  Trachea midline, no masses. Cardiovascular: No clubbing, cyanosis, or  edema. Respiratory: Normal respiratory effort, no increased work of breathing. GI: Abdomen is soft, nontender, nondistended, no abdominal masses GU: Normal phallus.  Bilateral descended testicles without masses.  Vasa easily palpable bilaterally. Skin: No rashes, bruises or suspicious lesions. Neurologic: Grossly intact, no focal deficits, moving all 4 extremities. Psychiatric: Normal mood and affect.  Laboratory Data: N/a  Urinalysis n/a  Pertinent Imaging: N/a  Assessment & Plan:    1. Vasectomy evaluation Today, we discussed what the vas deferens is, where it is located, and its function. We reviewed the procedure for vasectomy, it's risks, benefits, alternatives, and likelihood of achieving his goals. We discussed in detail the procedure, complications, and recovery as well as the need for clearance prior to unprotected intercourse. We discussed that vasectomy does not protect against sexually transmitted diseases. We discussed that this procedure does not result in immediate sterility and that they would need to use other forms of birth control until he has been cleared with negative postvasectomy semen analyses. I explained that the procedure is considered to be permanent and that attempts at reversal have varying degrees of success. These options include vasectomy reversal, sperm retrieval, and in vitro fertilization; these can be very expensive. We discussed the chance  of postvasectomy pain syndrome which occurs in less than 5% of patients. I explained to the patient that there is no treatment to resolve this chronic pain, and that if it developed I would not be able to help resolve the issue, but that surgery is generally not needed for correction. I explained there have even been reports of systemic like illness associated with this chronic pain, and that there was no good cure. I explained that vasectomy it is not a 100% reliable form of birth control, and the risk of pregnancy after  vasectomy is approximately 1 in 2000 men who had a negative postvasectomy semen analysis or rare non-motile sperm. I explained that repeat vasectomy was necessary in less than 1% of vasectomy procedures when employing the type of technique that I use. I explained that he should refrain from ejaculation for approximately one week following vasectomy. I explained that there are other options for birth control which are permanent and non-permanent; we discussed these. I explained the rates of surgical complications, such as symptomatic hematoma or infection, are low (1-2%) and vary with the surgeon's experience and criteria used to diagnose the complication.   The patient had the opportunity to ask questions to his stated satisfaction. He voiced understanding of the above factors and stated that he has read all the information provided to him and the packets and informed consent.  He is interested in receiving of Valium 10 mg prior to the procedure for the purpose of anxiolysis.  A prescription was given today.  He will have a driver on the day of the procedure.   Vanna Scotland, MD  [facility] [address] [place], [place] [address] [phone]

## 2020-09-27 NOTE — Patient Instructions (Signed)

## 2020-10-18 ENCOUNTER — Encounter: Payer: Self-pay | Admitting: Internal Medicine

## 2020-10-19 ENCOUNTER — Telehealth: Payer: Self-pay

## 2020-10-19 NOTE — Telephone Encounter (Signed)
FMLA paperwork left in your inbox

## 2020-10-19 NOTE — Telephone Encounter (Signed)
Done, given to MYD 

## 2020-10-19 NOTE — Telephone Encounter (Signed)
Copy sent to be scanned and original given to pt

## 2020-11-03 ENCOUNTER — Encounter: Payer: Self-pay | Admitting: Urology

## 2020-12-14 ENCOUNTER — Encounter: Payer: Self-pay | Admitting: Internal Medicine

## 2020-12-14 ENCOUNTER — Other Ambulatory Visit: Payer: Self-pay

## 2020-12-14 ENCOUNTER — Ambulatory Visit: Payer: Federal, State, Local not specified - PPO | Admitting: Internal Medicine

## 2020-12-14 DIAGNOSIS — F40243 Fear of flying: Secondary | ICD-10-CM

## 2020-12-14 MED ORDER — ALPRAZOLAM 0.5 MG PO TABS: 0.5000 mg | ORAL_TABLET | Freq: Every day | ORAL | 0 refills | Status: AC | PRN

## 2020-12-14 NOTE — Progress Notes (Signed)
Subjective:    Patient ID: Patrick Hahn, male    DOB: 01/02/1981, 40 y.o.   MRN: [id number]  HPI  Patient presents the clinic today with complaint of anxiety. He reports he flies to [facility] next Thursday and comes back Sunday. He reports he is anxious about flying. He also reports anxiety related to work stress, his wife just got a new job and there schedules are about to change. He denies depression. He is not currently seeing a therapist. He has not taken any medication for this. He does not think he needs daily medication for anxiety, reports he will just work through it.  Review of Systems      No past medical history on file.  Current Outpatient Medications  Medication Sig Dispense Refill  . triamcinolone (KENALOG) 0.1 % Apply 1 application topically 2 (two) times daily. 30 g 2   No current facility-administered medications for this visit.    No Known Allergies  Family History  Problem Relation Age of Onset  . Cancer Maternal Uncle        throat  . Prostate cancer Father   . Diabetes Neg Hx   . Early death Neg Hx   . Heart disease Neg Hx   . Stroke Neg Hx     Social History   Socioeconomic History  . Marital status: Married    Spouse name: Not on file  . Number of children: Not on file  . Years of education: Not on file  . Highest education level: Not on file  Occupational History  . Not on file  Tobacco Use  . Smoking status: Never Smoker  . Smokeless tobacco: Never Used  Substance and Sexual Activity  . Alcohol use: Yes    Alcohol/week: 1.0 standard drink    Types: 1 Shots of liquor per week    Comment: occasional  . Drug use: No  . Sexual activity: Yes  Other Topics Concern  . Not on file  Social History Narrative  . Not on file   Social Determinants of Health   Financial Resource Strain: Not on file  Food Insecurity: Not on file  Transportation Needs: Not on file  Physical Activity: Not on file  Stress: Not on file  Social Connections: Not  on file  Intimate Partner Violence: Not on file     Constitutional: Denies fever, malaise, fatigue, headache or abrupt weight changes.  Respiratory: Denies difficulty breathing, shortness of breath, cough or sputum production.   Cardiovascular: Denies chest pain, chest tightness, palpitations or swelling in the hands or feet.  Neurological: Denies dizziness, difficulty with memory, difficulty with speech or problems with balance and coordination.  Psych: Patient reports anxiety.  Denies depression, SI/HI.  No other specific complaints in a complete review of systems (except as listed in HPI above).  Objective:   Physical Exam  BP (!) 107/54 (BP Location: Left Arm, Patient Position: Sitting, Cuff Size: Normal)   Pulse 87   Temp (!) 97.5 F (36.4 C) (Temporal)   Resp 17   Ht 5\' 11"  (1.803 m)   Wt 203 lb (92.1 kg)   SpO2 100%   BMI 28.31 kg/m   Wt Readings from Last 3 Encounters:  09/27/20 213 lb (96.6 kg)  09/12/20 217 lb (98.4 kg)  06/14/20 249 lb (112.9 kg)    General: Appears his stated age, well developed, well nourished in NAD. HEENT: Head: normal shape and size; Eyes: sclera white and EOMs intact;  Cardiovascular: Normal rate. Pulmonary/Chest:  Normal effort. Neurological: Alert and oriented.   Psychiatric: Mood and affect normal. Mildly  Anxious appearing. Judgment and thought content normal.    BMET    Component Value Date/Time   NA 138 09/12/2020 1534   K 3.5 09/12/2020 1534   CL 103 09/12/2020 1534   CO2 28 09/12/2020 1534   GLUCOSE 80 09/12/2020 1534   BUN 15 09/12/2020 1534   CREATININE 1.08 09/12/2020 1534   CALCIUM 9.0 09/12/2020 1534    Lipid Panel     Component Value Date/Time   CHOL 114 09/12/2020 1534   TRIG 82.0 09/12/2020 1534   HDL 32.90 (L) 09/12/2020 1534   CHOLHDL 3 09/12/2020 1534   VLDL 16.4 09/12/2020 1534   LDLCALC 64 09/12/2020 1534    CBC    Component Value Date/Time   WBC 7.4 09/12/2020 1534   RBC 4.34 09/12/2020 1534    HGB 12.0 (L) 09/12/2020 1534   HCT 34.0 (L) 09/12/2020 1534   PLT 118.0 Repeated and verified X2. (L) 09/12/2020 1534   MCV 78.3 09/12/2020 1534   MCHC 35.3 09/12/2020 1534   RDW 18.5 (H) 09/12/2020 1534    Hgb A1C Lab Results  Component Value Date   HGBA1C 4.1 09/13/2020            Assessment & Plan:    Nicki Reaper, NP This visit occurred during the SARS-CoV-2 public health emergency.  Safety protocols were in place, including screening questions prior to the visit, additional usage of staff PPE, and extensive cleaning of exam room while observing appropriate contact time as indicated for disinfecting solutions.

## 2020-12-14 NOTE — Patient Instructions (Signed)
[email or web address].[email or web address]">  Generalized Anxiety Disorder, Adult Generalized anxiety disorder (GAD) is a mental health condition. Unlike normal worries, anxiety related to GAD is not triggered by a specific event. These worries do not fade or get better with time. GAD interferes with relationships, work, and school. GAD symptoms can vary from mild to severe. People with severe GAD can have intense waves of anxiety with physical symptoms that are similar to panic attacks. What are the causes? The exact cause of GAD is not known, but the following are believed to have an impact:  Differences in natural brain chemicals.  Genes passed down from parents to children.  Differences in the way threats are perceived.  Development during childhood.  Personality. What increases the risk? The following factors may make you more likely to develop this condition:  Being male.  Having a family history of anxiety disorders.  Being very shy.  Experiencing very stressful life events, such as the death of a loved one.  Having a very stressful family environment. What are the signs or symptoms? People with GAD often worry excessively about many things in their lives, such as their health and family. Symptoms may also include:  Mental and emotional symptoms: ? Worrying excessively about natural disasters. ? Fear of being late. ? Difficulty concentrating. ? Fears that others are judging your performance.  Physical symptoms: ? Fatigue. ? Headaches, muscle tension, muscle twitches, trembling, or feeling shaky. ? Feeling like your heart is pounding or beating very fast. ? Feeling out of breath or like you cannot take a deep breath. ? Having trouble falling asleep or staying asleep, or experiencing restlessness. ? Sweating. ? Nausea, diarrhea, or irritable bowel syndrome (IBS).  Behavioral symptoms: ? Experiencing erratic moods or irritability. ? Avoidance of new situations. ? Avoidance of  people. ? Extreme difficulty making decisions. How is this diagnosed? This condition is diagnosed based on your symptoms and medical history. You will also have a physical exam. Your health care provider may perform tests to rule out other possible causes of your symptoms. To be diagnosed with GAD, a person must have anxiety that:  Is out of his or her control.  Affects several different aspects of his or her life, such as work and relationships.  Causes distress that makes him or her unable to take part in normal activities.  Includes at least three symptoms of GAD, such as restlessness, fatigue, trouble concentrating, irritability, muscle tension, or sleep problems. Before your health care provider can confirm a diagnosis of GAD, these symptoms must be present more days than they are not, and they must last for 6 months or longer. How is this treated? This condition may be treated with:  Medicine. Antidepressant medicine is usually prescribed for long-term daily control. Anti-anxiety medicines may be added in severe cases, especially when panic attacks occur.  Talk therapy (psychotherapy). Certain types of talk therapy can be helpful in treating GAD by providing support, education, and guidance. Options include: ? Cognitive behavioral therapy (CBT). People learn coping skills and self-calming techniques to ease their physical symptoms. They learn to identify unrealistic thoughts and behaviors and to replace them with more appropriate thoughts and behaviors. ? Acceptance and commitment therapy (ACT). This treatment teaches people how to be mindful as a way to cope with unwanted thoughts and feelings. ? Biofeedback. This process trains you to manage your body's response (physiological response) through breathing techniques and relaxation methods. You will work with a therapist while machines are used to monitor your physical   symptoms.  Stress management techniques. These include yoga,  meditation, and exercise. A mental health specialist can help determine which treatment is best for you. Some people see improvement with one type of therapy. However, other people require a combination of therapies.   Follow these instructions at home: Lifestyle  Maintain a consistent routine and schedule.  Anticipate stressful situations. Create a plan, and allow extra time to work with your plan.  Practice stress management or self-calming techniques that you have learned from your therapist or your health care provider. General instructions  Take over-the-counter and prescription medicines only as told by your health care provider.  Understand that you are likely to have setbacks. Accept this and be kind to yourself as you persist to take better care of yourself.  Recognize and accept your accomplishments, even if you judge them as small.  Keep all follow-up visits as told by your health care provider. This is important. Contact a health care provider if:  Your symptoms do not get better.  Your symptoms get worse.  You have signs of depression, such as: ? A persistently sad or irritable mood. ? Loss of enjoyment in activities that used to bring you joy. ? Change in weight or eating. ? Changes in sleeping habits. ? Avoiding friends or family members. ? Loss of energy for normal tasks. ? Feelings of guilt or worthlessness. Get help right away if:  You have serious thoughts about hurting yourself or others. If you ever feel like you may hurt yourself or others, or have thoughts about taking your own life, get help right away. Go to your nearest emergency department or:  Call your local emergency services (911 in the [place]  Call a suicide crisis helpline, such as the National Suicide Prevention Lifeline at 1-800-273-8255. This is open 24 hours a day in the [place]  Text the Crisis Text Line at 741741 (in the [place] Summary  Generalized anxiety disorder (GAD) is a mental  health condition that involves worry that is not triggered by a specific event.  People with GAD often worry excessively about many things in their lives, such as their health and family.  GAD may cause symptoms such as restlessness, trouble concentrating, sleep problems, frequent sweating, nausea, diarrhea, headaches, and trembling or muscle twitching.  A mental health specialist can help determine which treatment is best for you. Some people see improvement with one type of therapy. However, other people require a combination of therapies. This information is not intended to replace advice given to you by your health care provider. Make sure you discuss any questions you have with your health care provider. Document Revised: 05/19/2019 Document Reviewed: 05/19/2019 Elsevier Patient Education  2021 Elsevier Inc.  

## 2020-12-14 NOTE — Assessment & Plan Note (Signed)
Will give a limited amount of Xanax to take 30 minutes prior to flights Support offered today

## 2021-01-11 ENCOUNTER — Ambulatory Visit: Payer: Federal, State, Local not specified - PPO | Admitting: Internal Medicine

## 2021-01-11 ENCOUNTER — Encounter: Payer: Self-pay | Admitting: Internal Medicine

## 2021-01-11 ENCOUNTER — Other Ambulatory Visit: Payer: Self-pay

## 2021-01-11 DIAGNOSIS — F418 Other specified anxiety disorders: Secondary | ICD-10-CM | POA: Diagnosis not present

## 2021-01-11 NOTE — Patient Instructions (Signed)

## 2021-01-11 NOTE — Progress Notes (Signed)
Subjective:    Patient ID: Patrick Hahn, male    DOB: 1981/08/05, 40 y.o.   MRN: [id number]  HPI  Patient presents to clinic today with anxiety. This is related to stress at work and at home. His wife recently got a new job. They are having difficulty figuring out child care due to them both working different shifts. He also reports some financial strain which is contributing to his anxiety. He has never taken any medication for anxiety in the past. He denies depression, SI/HI.  He also reports a bump to his left eyelid that is tender to touch. He has not noticed any discharge from the area. He denies vision changes.  Review of Systems      No past medical history on file.  Current Outpatient Medications  Medication Sig Dispense Refill  . ALPRAZolam (XANAX) 0.5 MG tablet Take 1 tablet (0.5 mg total) by mouth daily as needed for anxiety. 10 tablet 0  . triamcinolone (KENALOG) 0.1 % Apply 1 application topically 2 (two) times daily. 30 g 2   No current facility-administered medications for this visit.    No Known Allergies  Family History  Problem Relation Age of Onset  . Cancer Maternal Uncle        throat  . Prostate cancer Father   . Diabetes Neg Hx   . Early death Neg Hx   . Heart disease Neg Hx   . Stroke Neg Hx     Social History   Socioeconomic History  . Marital status: Married    Spouse name: Not on file  . Number of children: Not on file  . Years of education: Not on file  . Highest education level: Not on file  Occupational History  . Not on file  Tobacco Use  . Smoking status: Never Smoker  . Smokeless tobacco: Never Used  Vaping Use  . Vaping Use: Never used  Substance and Sexual Activity  . Alcohol use: Yes    Alcohol/week: 1.0 standard drink    Types: 1 Shots of liquor per week    Comment: occasional  . Drug use: No  . Sexual activity: Yes  Other Topics Concern  . Not on file  Social History Narrative  . Not on file   Social Determinants  of Health   Financial Resource Strain: Not on file  Food Insecurity: Not on file  Transportation Needs: Not on file  Physical Activity: Not on file  Stress: Not on file  Social Connections: Not on file  Intimate Partner Violence: Not on file     Constitutional: Denies fever, malaise, fatigue, headache or abrupt weight changes.  HEENT: Pt reports bump of left eyelid. Denies eye pain, eye redness, ear pain, ringing in the ears, wax buildup, runny nose, nasal congestion, bloody nose, or sore throat. Respiratory: Denies difficulty breathing, shortness of breath, cough or sputum production.   Cardiovascular: Denies chest pain, chest tightness, palpitations or swelling in the hands or feet.  Gastrointestinal: Denies abdominal pain, bloating, constipation, diarrhea or blood in the stool.  GU: Denies urgency, frequency, pain with urination, burning sensation, blood in urine, odor or discharge. Musculoskeletal: Denies decrease in range of motion, difficulty with gait, muscle pain or joint pain or swelling.  Skin: Denies redness, rashes, lesions or ulcercations.  Neurological: Denies dizziness, difficulty with memory, difficulty with speech or problems with balance and coordination.  Psych: Pt reports anxiety. Denies depression, SI/HI.  No other specific complaints in a complete review of systems (  except as listed in HPI above).  Objective:   Physical Exam  BP 111/67 (BP Location: Right Arm, Patient Position: Sitting, Cuff Size: Normal)   Pulse 84   Temp (!) 97.5 F (36.4 C) (Temporal)   Resp 16   Ht 5\' 11"  (1.803 m)   Wt 204 lb 9.6 oz (92.8 kg)   SpO2 100%   BMI 28.54 kg/m   Wt Readings from Last 3 Encounters:  12/14/20 203 lb (92.1 kg)  09/27/20 213 lb (96.6 kg)  09/12/20 217 lb (98.4 kg)    General: Appears his stated age, well developed, well nourished in NAD. Skin: Warm, dry and intact.  HEENT: Head: normal shape and size; Eyes: sclera white, no icterus, conjunctiva pink,  PERRLA and EOMs intact, stye noted of left upper eyelid-inner canthus; Cardiovascular: Normal rate. Pulmonary/Chest: Normal effort. Neurological: Alert and oriented. Psychiatric: Mood and affect normal. Mildly anxious appearing. Judgment and thought content normal.     BMET    Component Value Date/Time   NA 138 09/12/2020 1534   K 3.5 09/12/2020 1534   CL 103 09/12/2020 1534   CO2 28 09/12/2020 1534   GLUCOSE 80 09/12/2020 1534   BUN 15 09/12/2020 1534   CREATININE 1.08 09/12/2020 1534   CALCIUM 9.0 09/12/2020 1534    Lipid Panel     Component Value Date/Time   CHOL 114 09/12/2020 1534   TRIG 82.0 09/12/2020 1534   HDL 32.90 (L) 09/12/2020 1534   CHOLHDL 3 09/12/2020 1534   VLDL 16.4 09/12/2020 1534   LDLCALC 64 09/12/2020 1534    CBC    Component Value Date/Time   WBC 7.4 09/12/2020 1534   RBC 4.34 09/12/2020 1534   HGB 12.0 (L) 09/12/2020 1534   HCT 34.0 (L) 09/12/2020 1534   PLT 118.0 Repeated and verified X2. (L) 09/12/2020 1534   MCV 78.3 09/12/2020 1534   MCHC 35.3 09/12/2020 1534   RDW 18.5 (H) 09/12/2020 1534    Hgb A1C Lab Results  Component Value Date   HGBA1C 4.1 09/13/2020          Assessment & Plan:   Stye of Left Upper Eyelid:  Warm compresses TID Encouraged lid massage Let me know if does not improve  Return precautions discussed 11/11/2020, NP This visit occurred during the SARS-CoV-2 public health emergency.  Safety protocols were in place, including screening questions prior to the visit, additional usage of staff PPE, and extensive cleaning of exam room while observing appropriate contact time as indicated for disinfecting solutions.

## 2021-01-11 NOTE — Assessment & Plan Note (Signed)
He will hold off on daily medication at this time Discussed possible FMLA to allow him to change shifts to ease some of the burden Support offered

## 2021-01-15 ENCOUNTER — Encounter: Payer: Self-pay | Admitting: Internal Medicine

## 2021-06-07 ENCOUNTER — Ambulatory Visit: Payer: Federal, State, Local not specified - PPO | Admitting: Internal Medicine

## 2021-06-12 ENCOUNTER — Ambulatory Visit: Payer: Federal, State, Local not specified - PPO | Admitting: Internal Medicine

## 2021-06-12 NOTE — Progress Notes (Deleted)
Subjective:    Patient ID: Patrick Hahn, male    DOB: 04-Apr-1981, 40 y.o.   MRN: [id number]  HPI  Pt presents to the clinic today  with c/o shoulder pain. This started. He describes the pain as.  Review of Systems  No past medical history on file.  Current Outpatient Medications  Medication Sig Dispense Refill   ALPRAZolam (XANAX) 0.5 MG tablet Take 1 tablet (0.5 mg total) by mouth daily as needed for anxiety. 10 tablet 0   triamcinolone (KENALOG) 0.1 % Apply 1 application topically 2 (two) times daily. 30 g 2   No current facility-administered medications for this visit.    No Known Allergies  Family History  Problem Relation Age of Onset   Cancer Maternal Uncle        throat   Prostate cancer Father    Diabetes Neg Hx    Early death Neg Hx    Heart disease Neg Hx    Stroke Neg Hx     Social History   Socioeconomic History   Marital status: Married    Spouse name: Not on file   Number of children: Not on file   Years of education: Not on file   Highest education level: Not on file  Occupational History   Not on file  Tobacco Use   Smoking status: Never   Smokeless tobacco: Never  Vaping Use   Vaping Use: Never used  Substance and Sexual Activity   Alcohol use: Yes    Alcohol/week: 1.0 standard drink    Types: 1 Shots of liquor per week    Comment: occasional   Drug use: No   Sexual activity: Yes  Other Topics Concern   Not on file  Social History Narrative   Not on file   Social Determinants of Health   Financial Resource Strain: Not on file  Food Insecurity: Not on file  Transportation Needs: Not on file  Physical Activity: Not on file  Stress: Not on file  Social Connections: Not on file  Intimate Partner Violence: Not on file     Constitutional: Denies fever, malaise, fatigue, headache or abrupt weight changes.  HEENT: Denies eye pain, eye redness, ear pain, ringing in the ears, wax buildup, runny nose, nasal congestion, bloody nose, or  sore throat. Respiratory: Denies difficulty breathing, shortness of breath, cough or sputum production.   Cardiovascular: Denies chest pain, chest tightness, palpitations or swelling in the hands or feet.  Gastrointestinal: Denies abdominal pain, bloating, constipation, diarrhea or blood in the stool.  GU: Denies urgency, frequency, pain with urination, burning sensation, blood in urine, odor or discharge. Musculoskeletal: Pt reports shoulder pain. Denies decrease in range of motion, difficulty with gait, muscle pain or joint swelling.  Skin: Denies redness, rashes, lesions or ulcercations.  Neurological: Denies dizziness, difficulty with memory, difficulty with speech or problems with balance and coordination.  Psych: Denies anxiety, depression, SI/HI.  No other specific complaints in a complete review of systems (except as listed in HPI above).     Objective:   Physical Exam There were no vitals taken for this visit. Wt Readings from Last 3 Encounters:  01/11/21 204 lb 9.6 oz (92.8 kg)  12/14/20 203 lb (92.1 kg)  09/27/20 213 lb (96.6 kg)    General: Appears their stated age, well developed, well nourished in NAD. Skin: Warm, dry and intact. No rashes, lesions or ulcerations noted. HEENT: Head: normal shape and size; Eyes: sclera white, no icterus, conjunctiva pink, PERRLA  and EOMs intact; Ears: Tm's gray and intact, normal light reflex; Nose: mucosa pink and moist, septum midline; Throat/Mouth: Teeth present, mucosa pink and moist, no exudate, lesions or ulcerations noted.  Neck:  Neck supple, trachea midline. No masses, lumps or thyromegaly present.  Cardiovascular: Normal rate and rhythm. S1,S2 noted.  No murmur, rubs or gallops noted. No JVD or BLE edema. No carotid bruits noted. Pulmonary/Chest: Normal effort and positive vesicular breath sounds. No respiratory distress. No wheezes, rales or ronchi noted.  Abdomen: Soft and nontender. Normal bowel sounds. No distention or masses  noted. Liver, spleen and kidneys non palpable. Musculoskeletal: Normal range of motion. No signs of joint swelling. No difficulty with gait.  Neurological: Alert and oriented. Cranial nerves II-XII grossly intact. Coordination normal.  Psychiatric: Mood and affect normal. Behavior is normal. Judgment and thought content normal.    BMET    Component Value Date/Time   NA 138 09/12/2020 1534   K 3.5 09/12/2020 1534   CL 103 09/12/2020 1534   CO2 28 09/12/2020 1534   GLUCOSE 80 09/12/2020 1534   BUN 15 09/12/2020 1534   CREATININE 1.08 09/12/2020 1534   CALCIUM 9.0 09/12/2020 1534    Lipid Panel     Component Value Date/Time   CHOL 114 09/12/2020 1534   TRIG 82.0 09/12/2020 1534   HDL 32.90 (L) 09/12/2020 1534   CHOLHDL 3 09/12/2020 1534   VLDL 16.4 09/12/2020 1534   LDLCALC 64 09/12/2020 1534    CBC    Component Value Date/Time   WBC 7.4 09/12/2020 1534   RBC 4.34 09/12/2020 1534   HGB 12.0 (L) 09/12/2020 1534   HCT 34.0 (L) 09/12/2020 1534   PLT 118.0 Repeated and verified X2. (L) 09/12/2020 1534   MCV 78.3 09/12/2020 1534   MCHC 35.3 09/12/2020 1534   RDW 18.5 (H) 09/12/2020 1534    Hgb A1C Lab Results  Component Value Date   HGBA1C 4.1 09/13/2020             Assessment & Plan:     Nicki Reaper, NP This visit occurred during the SARS-CoV-2 public health emergency.  Safety protocols were in place, including screening questions prior to the visit, additional usage of staff PPE, and extensive cleaning of exam room while observing appropriate contact time as indicated for disinfecting solutions.

## 2021-07-04 ENCOUNTER — Encounter: Payer: Federal, State, Local not specified - PPO | Admitting: Urology

## 2021-07-24 ENCOUNTER — Encounter: Payer: Federal, State, Local not specified - PPO | Admitting: Urology

## 2021-07-25 ENCOUNTER — Encounter: Payer: Federal, State, Local not specified - PPO | Admitting: Urology

## 2021-11-06 ENCOUNTER — Ambulatory Visit: Payer: 59 | Admitting: Physician Assistant

## 2021-11-06 ENCOUNTER — Other Ambulatory Visit (HOSPITAL_COMMUNITY)
Admission: RE | Admit: 2021-11-06 | Discharge: 2021-11-06 | Disposition: A | Discharge status: Home / Self Care | Payer: 59 | Source: Ambulatory Visit | Attending: Physician Assistant | Admitting: Physician Assistant

## 2021-11-06 ENCOUNTER — Encounter: Payer: Self-pay | Admitting: Physician Assistant

## 2021-11-06 ENCOUNTER — Other Ambulatory Visit: Payer: Self-pay

## 2021-11-06 VITALS — BP 122/84 | HR 80 | Temp 96.8°F | Wt 237.0 lb

## 2021-11-06 DIAGNOSIS — R300 Dysuria: Secondary | ICD-10-CM | POA: Diagnosis not present

## 2021-11-06 DIAGNOSIS — R822 Biliuria: Secondary | ICD-10-CM | POA: Diagnosis not present

## 2021-11-06 DIAGNOSIS — Z113 Encounter for screening for infections with a predominantly sexual mode of transmission: Secondary | ICD-10-CM | POA: Insufficient documentation

## 2021-11-06 LAB — POCT URINALYSIS DIPSTICK
Bilirubin, UA: NEGATIVE
Glucose, UA: NEGATIVE
Ketones, UA: NEGATIVE
Leukocytes, UA: NEGATIVE
Nitrite, UA: NEGATIVE
Protein, UA: NEGATIVE
Spec Grav, UA: <=1.005 — AB (ref 1.010–1.025)
Urobilinogen, UA: >=8 E.U./dL — AB
pH, UA: 8 (ref 5.0–8.0)

## 2021-11-06 NOTE — Progress Notes (Signed)
? ? ?  ?    Acute Office Visit ? ? ?Patient: Patrick Hahn   DOB: 07-15-1981   41 y.o. Male  MRN: [id number] ?Visit Date: 11/06/2021 ? ?Today's healthcare provider: Oswaldo Conroy Stockton Nunley, PA-C  ?Introduced myself to the patient as a Secondary school teacher and provided education on APPs in clinical practice.  ? ? ?Chief Complaint  ?Patient presents with  ? Urinary Tract Infection  ? ?Subjective  ?  ?Urinary Tract Infection  ?Associated symptoms include urgency. Pertinent negatives include no chills, flank pain, frequency or hematuria.   ? ?States symptoms started this AM maybe last night ?States there is a mild irritation that is constant, does not worsen with urination ? ?States he is not concerned for exposure to STIs at this time but is amenable to checking today ? ?Denies pain radiating to perineum or systemic symptoms such as fever, nausea, vomiting ? ? ? ?Medications: ?Outpatient Medications Prior to Visit  ?Medication Sig  ? ALPRAZolam (XANAX) 0.5 MG tablet Take 1 tablet (0.5 mg total) by mouth daily as needed for anxiety.  ? triamcinolone (KENALOG) 0.1 % Apply 1 application topically 2 (two) times daily.  ? ?No facility-administered medications prior to visit.  ? ? ?Review of Systems  ?Constitutional:  Negative for chills, diaphoresis and fever.  ?HENT:  Negative for sore throat.   ?Genitourinary:  Positive for dysuria and urgency. Negative for decreased urine volume, difficulty urinating, enuresis, flank pain, frequency, genital sores, hematuria, penile discharge, penile pain, penile swelling, scrotal swelling and testicular pain.  ?Musculoskeletal:  Negative for arthralgias.  ?Neurological:  Negative for dizziness, light-headedness and headaches.  ? ? ?  Objective  ?  ?BP 122/84 (BP Location: Left Arm, Patient Position: Sitting, Cuff Size: Large)   Pulse 80   Temp (!) 96.8 ?F (36 ?C) (Temporal)   Wt 237 lb (107.5 kg)   SpO2 99%   BMI 33.05 kg/m?  ? ? ?Physical Exam ?Vitals reviewed.  ?Constitutional:   ?   General: He is awake.   ?   Appearance: Normal appearance. He is well-developed and well-groomed. He is obese.  ?HENT:  ?   Head: Normocephalic and atraumatic.  ?Cardiovascular:  ?   Rate and Rhythm: Normal rate and regular rhythm.  ?   Pulses: Normal pulses.  ?   Heart sounds: Normal heart sounds.  ?Pulmonary:  ?   Effort: Pulmonary effort is normal.  ?   Breath sounds: Normal breath sounds and air entry.  ?Abdominal:  ?   General: Abdomen is flat. Bowel sounds are normal.  ?   Palpations: Abdomen is soft.  ?   Tenderness: There is no abdominal tenderness. There is no right CVA tenderness or left CVA tenderness.  ?Musculoskeletal:  ?   Right lower leg: No edema.  ?   Left lower leg: No edema.  ?Neurological:  ?   General: No focal deficit present.  ?   Mental Status: He is alert and oriented to person, place, and time.  ?   GCS: GCS eye subscore is 4. GCS verbal subscore is 5. GCS motor subscore is 6.  ?Psychiatric:     ?   Attention and Perception: Attention and perception normal.     ?   Mood and Affect: Mood and affect normal.     ?   Speech: Speech normal.     ?   Behavior: Behavior normal. Behavior is cooperative.  ?  ? ? ?Results for orders placed or performed in visit on  11/06/21  ?POCT urinalysis dipstick  ?Result Value Ref Range  ? Color, UA    ? Clarity, UA    ? Glucose, UA Negative Negative  ? Bilirubin, UA Negative   ? Ketones, UA Negative   ? Spec Grav, UA <=1.005 (A) 1.010 - 1.025  ? Blood, UA Trace   ? pH, UA 8.0 5.0 - 8.0  ? Protein, UA Negative Negative  ? Urobilinogen, UA >=8.0 (A) 0.2 or 1.0 E.U./dL  ? Nitrite, UA Negative   ? Leukocytes, UA Negative Negative  ? Appearance    ? Odor    ? ? Assessment & Plan  ?  ? ?Problem List Items Addressed This Visit   ?None ?Visit Diagnoses   ? ? Dysuria    -  Primary ?Acute, new problem ?States symptoms began last night  ?Reports constant discomfort that does not seem to be impacted by urination ?UA was positive for urobilinogen and trace RBC ?Patient denies radiation of pain or  flank pain, fever, urinary frequency, hesitancy or incomplete voiding ?Will send for Urine culture and have CBC and CMP to check bilirubin levels ?Suspect this could have several potential etiologies: nephrolithiasis, UTI are among differential at this time ?Recommend he stay well hydrated, will manage further per results of lab work ?Follow up as needed for persistent or worsening symptoms ? ?  ? Relevant Orders  ? POCT urinalysis dipstick (Completed)  ? Urine Culture  ? CBC w/Diff/Platelet  ? Screening for STD (sexually transmitted disease)      ? Relevant Orders  ? Urine cytology ancillary only  ? Bilirubin in urine      ? Relevant Orders  ? Comprehensive Metabolic Panel (CMET)  ? CBC w/Diff/Platelet  ? ?  ? ? ? ?No follow-ups on file. ? ? ?I, Arhan Mcmanamon E Nandika Stetzer, PA-C, have reviewed all documentation for this visit. The documentation on 11/06/21 for the exam, diagnosis, procedures, and orders are all accurate and complete. ? ? ?Korine Winton, Mirian Mo MPH ?[facility] ?[facility] ? ? ?No follow-ups on file.  ?   ? ? ? ? ?

## 2021-11-08 LAB — URINE CULTURE
MICRO NUMBER:: 13194073
Result:: NO GROWTH
SPECIMEN QUALITY:: ADEQUATE

## 2021-11-08 LAB — URINE CYTOLOGY ANCILLARY ONLY
Chlamydia: NEGATIVE
Comment: NEGATIVE
Comment: NEGATIVE
Comment: NORMAL
Neisseria Gonorrhea: NEGATIVE
Trichomonas: NEGATIVE

## 2022-01-15 ENCOUNTER — Ambulatory Visit: Payer: 59 | Admitting: Internal Medicine

## 2022-01-15 ENCOUNTER — Ambulatory Visit (INDEPENDENT_AMBULATORY_CARE_PROVIDER_SITE_OTHER): Payer: 59 | Admitting: Internal Medicine

## 2022-01-15 ENCOUNTER — Encounter: Payer: Self-pay | Admitting: Internal Medicine

## 2022-01-15 VITALS — BP 116/78 | HR 74 | Temp 97.5°F | Ht 71.0 in | Wt 239.0 lb

## 2022-01-15 DIAGNOSIS — E6609 Other obesity due to excess calories: Secondary | ICD-10-CM | POA: Insufficient documentation

## 2022-01-15 DIAGNOSIS — L409 Psoriasis, unspecified: Secondary | ICD-10-CM

## 2022-01-15 DIAGNOSIS — Z6833 Body mass index (BMI) 33.0-33.9, adult: Secondary | ICD-10-CM

## 2022-01-15 DIAGNOSIS — Z0001 Encounter for general adult medical examination with abnormal findings: Secondary | ICD-10-CM

## 2022-01-15 DIAGNOSIS — D509 Iron deficiency anemia, unspecified: Secondary | ICD-10-CM | POA: Insufficient documentation

## 2022-01-15 DIAGNOSIS — D696 Thrombocytopenia, unspecified: Secondary | ICD-10-CM | POA: Insufficient documentation

## 2022-01-15 DIAGNOSIS — Z3009 Encounter for other general counseling and advice on contraception: Secondary | ICD-10-CM

## 2022-01-15 MED ORDER — CLOBETASOL PROPIONATE 0.05 % EX OINT: 1.0000 "application " | TOPICAL_OINTMENT | Freq: Two times a day (BID) | CUTANEOUS | 1 refills | Status: DC

## 2022-01-15 NOTE — Patient Instructions (Signed)
Health Maintenance, Male Adopting a healthy lifestyle and getting preventive care are important in promoting health and wellness. Ask your health care provider about: The right schedule for you to have regular tests and exams. Things you can do on your own to prevent diseases and keep yourself healthy. What should I know about diet, weight, and exercise? Eat a healthy diet  Eat a diet that includes plenty of vegetables, fruits, low-fat dairy products, and lean protein. Do not eat a lot of foods that are high in solid fats, added sugars, or sodium. Maintain a healthy weight Body mass index (BMI) is a measurement that can be used to identify possible weight problems. It estimates body fat based on height and weight. Your health care provider can help determine your BMI and help you achieve or maintain a healthy weight. Get regular exercise Get regular exercise. This is one of the most important things you can do for your health. Most adults should: Exercise for at least 150 minutes each week. The exercise should increase your heart rate and make you sweat (moderate-intensity exercise). Do strengthening exercises at least twice a week. This is in addition to the moderate-intensity exercise. Spend less time sitting. Even light physical activity can be beneficial. Watch cholesterol and blood lipids Have your blood tested for lipids and cholesterol at 40 years of age, then have this test every 5 years. You may need to have your cholesterol levels checked more often if: Your lipid or cholesterol levels are high. You are older than 40 years of age. You are at high risk for heart disease. What should I know about cancer screening? Many types of cancers can be detected early and may often be prevented. Depending on your health history and family history, you may need to have cancer screening at various ages. This may include screening for: Colorectal cancer. Prostate cancer. Skin cancer. Lung  cancer. What should I know about heart disease, diabetes, and high blood pressure? Blood pressure and heart disease High blood pressure causes heart disease and increases the risk of stroke. This is more likely to develop in people who have high blood pressure readings or are overweight. Talk with your health care provider about your target blood pressure readings. Have your blood pressure checked: Every 3-5 years if you are 18-39 years of age. Every year if you are 40 years old or older. If you are between the ages of 65 and 75 and are a current or former smoker, ask your health care provider if you should have a one-time screening for abdominal aortic aneurysm (AAA). Diabetes Have regular diabetes screenings. This checks your fasting blood sugar level. Have the screening done: Once every three years after age 45 if you are at a normal weight and have a low risk for diabetes. More often and at a younger age if you are overweight or have a high risk for diabetes. What should I know about preventing infection? Hepatitis B If you have a higher risk for hepatitis B, you should be screened for this virus. Talk with your health care provider to find out if you are at risk for hepatitis B infection. Hepatitis C Blood testing is recommended for: Everyone born from 1945 through 1965. Anyone with known risk factors for hepatitis C. Sexually transmitted infections (STIs) You should be screened each year for STIs, including gonorrhea and chlamydia, if: You are sexually active and are younger than 40 years of age. You are older than 40 years of age and your   health care provider tells you that you are at risk for this type of infection. Your sexual activity has changed since you were last screened, and you are at increased risk for chlamydia or gonorrhea. Ask your health care provider if you are at risk. Ask your health care provider about whether you are at high risk for HIV. Your health care provider  may recommend a prescription medicine to help prevent HIV infection. If you choose to take medicine to prevent HIV, you should first get tested for HIV. You should then be tested every 3 months for as long as you are taking the medicine. Follow these instructions at home: Alcohol use Do not drink alcohol if your health care provider tells you not to drink. If you drink alcohol: Limit how much you have to 0-2 drinks a day. Know how much alcohol is in your drink. In the [place] one drink equals one 12 oz bottle of beer (355 mL), one 5 oz glass of wine (148 mL), or one 1 oz glass of hard liquor (44 mL). Lifestyle Do not use any products that contain nicotine or tobacco. These products include cigarettes, chewing tobacco, and vaping devices, such as e-cigarettes. If you need help quitting, ask your health care provider. Do not use street drugs. Do not share needles. Ask your health care provider for help if you need support or information about quitting drugs. General instructions Schedule regular health, dental, and eye exams. Stay current with your vaccines. Tell your health care provider if: You often feel depressed. You have ever been abused or do not feel safe at home. Summary Adopting a healthy lifestyle and getting preventive care are important in promoting health and wellness. Follow your health care provider's instructions about healthy diet, exercising, and getting tested or screened for diseases. Follow your health care provider's instructions on monitoring your cholesterol and blood pressure. This information is not intended to replace advice given to you by your health care provider. Make sure you discuss any questions you have with your health care provider. Document Revised: 12/18/2020 Document Reviewed: 12/18/2020 Elsevier Patient Education  2023 Elsevier Inc.  

## 2022-01-15 NOTE — Progress Notes (Signed)
Subjective:    Patient ID: Patrick Hahn, male    DOB: July 13, 1981, 41 y.o.   MRN: [id number]  HPI  Patient presents to clinic today for his annual exam.  He would like a referral for a vasectomy as well  Flu: never Tetanus: 02/2016 COVID: [employer] x2 Dentist: biannually  Diet: He does eat meat. He consumes fruits and veggies. He does eat some fried foods. He drinks mostly water, juice, soda. Exercise: Yardwork  Review of Systems     No past medical history on file.  Current Outpatient Medications  Medication Sig Dispense Refill   ALPRAZolam (XANAX) 0.5 MG tablet Take 1 tablet (0.5 mg total) by mouth daily as needed for anxiety. 10 tablet 0   triamcinolone (KENALOG) 0.1 % Apply 1 application topically 2 (two) times daily. 30 g 2   No current facility-administered medications for this visit.    No Known Allergies  Family History  Problem Relation Age of Onset   Cancer Maternal Uncle        throat   Prostate cancer Father    Diabetes Neg Hx    Early death Neg Hx    Heart disease Neg Hx    Stroke Neg Hx     Social History   Socioeconomic History   Marital status: Married    Spouse name: Not on file   Number of children: Not on file   Years of education: Not on file   Highest education level: Not on file  Occupational History   Not on file  Tobacco Use   Smoking status: Never   Smokeless tobacco: Never  Vaping Use   Vaping Use: Never used  Substance and Sexual Activity   Alcohol use: Yes    Alcohol/week: 1.0 standard drink    Types: 1 Shots of liquor per week    Comment: occasional   Drug use: No   Sexual activity: Yes  Other Topics Concern   Not on file  Social History Narrative   Not on file   Social Determinants of Health   Financial Resource Strain: Not on file  Food Insecurity: Not on file  Transportation Needs: Not on file  Physical Activity: Not on file  Stress: Not on file  Social Connections: Not on file  Intimate Partner Violence: Not  on file     Constitutional: Denies fever, malaise, fatigue, headache or abrupt weight changes.  HEENT: Denies eye pain, eye redness, ear pain, ringing in the ears, wax buildup, runny nose, nasal congestion, bloody nose, or sore throat. Respiratory: Denies difficulty breathing, shortness of breath, cough or sputum production.   Cardiovascular: Denies chest pain, chest tightness, palpitations or swelling in the hands or feet.  Gastrointestinal: Denies abdominal pain, bloating, constipation, diarrhea or blood in the stool.  GU: Denies urgency, frequency, pain with urination, burning sensation, blood in urine, odor or discharge. Musculoskeletal: Patient reports chronic back pain.  Denies decrease in range of motion, difficulty with gait, or joint swelling.  Skin: Patient reports generalized rash.  Denies lesions or ulcercations.  Neurological: Denies dizziness, difficulty with memory, difficulty with speech or problems with balance and coordination.  Psych: Patient reports intermittent anxiety.  Denies depression, SI/HI.  No other specific complaints in a complete review of systems (except as listed in HPI above).  Objective:   Physical Exam BP 116/78 (BP Location: Left Arm, Patient Position: Sitting, Cuff Size: Large)   Pulse 74   Temp (!) 97.5 F (36.4 C) (Temporal)   Ht 5'  11" (1.803 m)   Wt 239 lb (108.4 kg)   SpO2 99%   BMI 33.33 kg/m   Wt Readings from Last 3 Encounters:  11/06/21 237 lb (107.5 kg)  01/11/21 204 lb 9.6 oz (92.8 kg)  12/14/20 203 lb (92.1 kg)    General: Appears his stated age, obese in NAD. Skin: Warm, dry and intact.  Psoriatic lesions noted at base of scalp, bilateral upper and lower extremities. HEENT: Head: normal shape and size; Eyes: sclera white, no icterus, conjunctiva pink, PERRLA and EOMs intact;  Neck:  Neck supple, trachea midline. No masses, lumps or thyromegaly present.  Cardiovascular: Normal rate and rhythm. S1,S2 noted.  No murmur, rubs or  gallops noted. No JVD or BLE edema.  Pulmonary/Chest: Normal effort and positive vesicular breath sounds. No respiratory distress. No wheezes, rales or ronchi noted.  Abdomen: Soft and nontender. Normal bowel sounds.  Musculoskeletal: Strength 5/5 BUE/BLE.  No difficulty with gait.  Neurological: Alert and oriented. Cranial nerves II-XII grossly intact. Coordination normal.  Psychiatric: Mood and affect normal. Behavior is normal. Judgment and thought content normal.    BMET    Component Value Date/Time   NA 138 09/12/2020 1534   K 3.5 09/12/2020 1534   CL 103 09/12/2020 1534   CO2 28 09/12/2020 1534   GLUCOSE 80 09/12/2020 1534   BUN 15 09/12/2020 1534   CREATININE 1.08 09/12/2020 1534   CALCIUM 9.0 09/12/2020 1534    Lipid Panel     Component Value Date/Time   CHOL 114 09/12/2020 1534   TRIG 82.0 09/12/2020 1534   HDL 32.90 (L) 09/12/2020 1534   CHOLHDL 3 09/12/2020 1534   VLDL 16.4 09/12/2020 1534   LDLCALC 64 09/12/2020 1534    CBC    Component Value Date/Time   WBC 7.4 09/12/2020 1534   RBC 4.34 09/12/2020 1534   HGB 12.0 (L) 09/12/2020 1534   HCT 34.0 (L) 09/12/2020 1534   PLT 118.0 Repeated and verified X2. (L) 09/12/2020 1534   MCV 78.3 09/12/2020 1534   MCHC 35.3 09/12/2020 1534   RDW 18.5 (H) 09/12/2020 1534    Hgb A1C Lab Results  Component Value Date   HGBA1C 4.1 09/13/2020           Assessment & Plan:   Preventative Health Maintenance:  Encouraged him to get a flu shot in the fall Tetanus UTD Encouraged him to get his COVID booster Encouraged him to consume a balanced diet and exercise regimen Advised him to see an eye doctor and dentist annually We will check CBC, c-Met and lipid profile today  Sterilization Consult:  Referral to urology for further evaluation  RTC in 6 months, follow-up chronic conditions Webb Silversmith, NP

## 2022-01-15 NOTE — Assessment & Plan Note (Signed)
Encourage diet and exercise for weight loss 

## 2022-01-15 NOTE — Assessment & Plan Note (Signed)
Rx for Clobetasol 0.05% twice daily as needed Referral to dermatology placed for further evaluation and treatment

## 2022-01-16 LAB — COMPLETE METABOLIC PANEL WITH GFR
AG Ratio: 1.3 (calc) (ref 1.0–2.5)
ALT: 19 U/L (ref 9–46)
AST: 18 U/L (ref 10–40)
Albumin: 3.9 g/dL (ref 3.6–5.1)
Alkaline phosphatase (APISO): 63 U/L (ref 36–130)
BUN: 9 mg/dL (ref 7–25)
CO2: 25 mmol/L (ref 20–32)
Calcium: 9 mg/dL (ref 8.6–10.3)
Chloride: 104 mmol/L (ref 98–110)
Creat: 0.87 mg/dL (ref 0.60–1.26)
Globulin: 3.1 g/dL (calc) (ref 1.9–3.7)
Glucose, Bld: 88 mg/dL (ref 65–139)
Potassium: 3.8 mmol/L (ref 3.5–5.3)
Sodium: 137 mmol/L (ref 135–146)
Total Bilirubin: 1.3 mg/dL — ABNORMAL HIGH (ref 0.2–1.2)
Total Protein: 7 g/dL (ref 6.1–8.1)
eGFR: 113 mL/min/{1.73_m2} (ref >=60)

## 2022-01-16 LAB — LIPID PANEL
Cholesterol: 112 mg/dL (ref <200)
HDL: 36 mg/dL — ABNORMAL LOW (ref >=40)
LDL Cholesterol (Calc): 61 mg/dL (calc)
Non-HDL Cholesterol (Calc): 76 mg/dL (calc) (ref <130)
Total CHOL/HDL Ratio: 3.1 (calc) (ref <5.0)
Triglycerides: 72 mg/dL (ref <150)

## 2022-01-16 LAB — CBC
HCT: 37 % — ABNORMAL LOW (ref 38.5–50.0)
Hemoglobin: 12.5 g/dL — ABNORMAL LOW (ref 13.2–17.1)
MCH: 27.5 pg (ref 27.0–33.0)
MCHC: 33.8 g/dL (ref 32.0–36.0)
MCV: 81.5 fL (ref 80.0–100.0)
MPV: 10.8 fL (ref 7.5–12.5)
Platelets: 76 10*3/uL — ABNORMAL LOW (ref 140–400)
RBC: 4.54 10*6/uL (ref 4.20–5.80)
RDW: 16.3 % — ABNORMAL HIGH (ref 11.0–15.0)
WBC: 5.1 10*3/uL (ref 3.8–10.8)

## 2022-01-24 ENCOUNTER — Ambulatory Visit (INDEPENDENT_AMBULATORY_CARE_PROVIDER_SITE_OTHER): Payer: 59 | Admitting: Urology

## 2022-01-24 ENCOUNTER — Encounter: Payer: Self-pay | Admitting: Urology

## 2022-01-24 VITALS — BP 108/72 | HR 94 | Ht 71.0 in | Wt 239.0 lb

## 2022-01-24 DIAGNOSIS — Z3009 Encounter for other general counseling and advice on contraception: Secondary | ICD-10-CM

## 2022-01-24 MED ORDER — DIAZEPAM 10 MG PO TABS: 10.0000 mg | ORAL_TABLET | Freq: Once | ORAL | 0 refills | Status: AC

## 2022-01-24 NOTE — Progress Notes (Signed)
01/24/2022 2:07 PM   Kem Kays Oct 13, 1980 [id number]  Referring provider: Lorre Munroe, NP [address] [place],  [place] [address]  Chief Complaint  Patient presents with   VAS Consult    HPI: Patrick Hahn is a 41 y.o. male who presents today for further evaluation of vasectomy.    He denies a history of testicular trauma or pain.  No urinary issues.  No previous scrotal surgeries.  He has biological children.   PMH: No past medical history on file.  Surgical History: Past Surgical History:  Procedure Laterality Date   GYNECOMASTIA EXCISION     41 years old    Home Medications:  Allergies as of 01/24/2022   No Known Allergies      Medication List        Accurate as of January 24, 2022  2:07 PM. If you have any questions, ask your nurse or doctor.          ALPRAZolam 0.5 MG tablet Commonly known as: XANAX Take 1 tablet (0.5 mg total) by mouth daily as needed for anxiety.   clobetasol ointment 0.05 % Commonly known as: TEMOVATE Apply 1 application. topically 2 (two) times daily.        Allergies: No Known Allergies  Family History: Family History  Problem Relation Age of Onset   Cancer Maternal Uncle        throat   Prostate cancer Father    Diabetes Neg Hx    Early death Neg Hx    Heart disease Neg Hx    Stroke Neg Hx     Social History:  reports that he has never smoked. He has never used smokeless tobacco. He reports current alcohol use of about 1.0 standard drink of alcohol per week. He reports that he does not use drugs.   Physical Exam: BP 108/72   Pulse 94   Ht 5\' 11"  (1.803 m)   Wt 239 lb (108.4 kg)   BMI 33.33 kg/m   Constitutional:  Alert and oriented, No acute distress. HEENT: [place] AT, moist mucus membranes.  Trachea midline, no masses. Cardiovascular: No clubbing, cyanosis, or edema. Respiratory: Normal respiratory effort, no increased work of breathing. GI: Abdomen is soft, nontender, nondistended, no abdominal  masses GU: Normal phallus.  Bilateral descended testicles without masses.  Vasa easily palpable bilaterally. Skin: No rashes, bruises or suspicious lesions. Neurologic: Grossly intact, no focal deficits, moving all 4 extremities. Psychiatric: Normal mood and affect.   Assessment & Plan:   1. Vasectomy evaluation - Today, we discussed what the vas deferens is, where it is located, and its function. We reviewed the procedure for vasectomy, it's risks, benefits, alternatives, and likelihood of achieving his goals. We discussed in detail the procedure, complications, and recovery as well as the need for clearance prior to unprotected intercourse. We discussed that vasectomy does not protect against sexually transmitted diseases. We discussed that this procedure does not result in immediate sterility and that they would need to use other forms of birth control until he has been cleared with negative postvasectomy semen analyses. I explained that the procedure is considered to be permanent and that attempts at reversal have varying degrees of success. These options include vasectomy reversal, sperm retrieval, and in vitro fertilization; these can be very expensive. We discussed the chance of postvasectomy pain syndrome which occurs in less than 5% of patients. I explained to the patient that there is no treatment to resolve this chronic pain, and that  if it developed I would not be able to help resolve the issue, but that surgery is generally not needed for correction. I explained there have even been reports of systemic like illness associated with this chronic pain, and that there was no good cure. I explained that vasectomy it is not a 100% reliable form of birth control, and the risk of pregnancy after vasectomy is approximately 1 in 2000 men who had a negative postvasectomy semen analysis or rare non-motile sperm. I explained that repeat vasectomy was necessary in less than 1% of vasectomy procedures when  employing the type of technique that I use. I explained that he should refrain from ejaculation for approximately one week following vasectomy. I explained that there are other options for birth control which are permanent and non-permanent; we discussed these. I explained the rates of surgical complications, such as symptomatic hematoma or infection, are low (1-2%) and vary with the surgeon's experience and criteria used to diagnose the complication.   The patient had the opportunity to ask questions to his stated satisfaction. He voiced understanding of the above factors and stated that he has read all the information provided to him and the packets and informed consent.  He is interested in receiving of Valium 10 mg prior to the procedure for the purpose of anxiolysis.  A prescription was given today.  He will have a driver on the day of the procedure.   Return for Vasectomy   Tawni Millers as a scribe for Vanna Scotland, MD.,have documented all relevant documentation on the behalf of Vanna Scotland, MD,as directed by  Vanna Scotland, MD while in the presence of Vanna Scotland, MD.  I have reviewed the above documentation for accuracy and completeness, and I agree with the above.   Vanna Scotland, MD   [facility] [address] [place], [place] [address] [phone]

## 2022-01-24 NOTE — Patient Instructions (Signed)

## 2022-02-21 ENCOUNTER — Encounter: Payer: 59 | Admitting: Urology

## 2022-02-26 ENCOUNTER — Telehealth (INDEPENDENT_AMBULATORY_CARE_PROVIDER_SITE_OTHER): Payer: 59 | Admitting: Internal Medicine

## 2022-02-26 ENCOUNTER — Encounter: Payer: Self-pay | Admitting: Internal Medicine

## 2022-02-26 DIAGNOSIS — Z0289 Encounter for other administrative examinations: Secondary | ICD-10-CM

## 2022-02-26 DIAGNOSIS — F418 Other specified anxiety disorders: Secondary | ICD-10-CM

## 2022-02-26 NOTE — Patient Instructions (Signed)

## 2022-02-26 NOTE — Progress Notes (Signed)
Virtual Visit via Video Note  I connected with Patrick Hahn on 02/26/22 at  4:00 PM EDT by a video enabled telemedicine application and verified that I am speaking with the correct person using two identifiers.  Location: Patient: Work Provider: Office  Persons participating in this video call: Nicki Reaper, NP and Patrick Hahn   I discussed the limitations of evaluation and management by telemedicine and the availability of in person appointments. The patient expressed understanding and agreed to proceed.  History of Present Illness:  Patient requesting FMLA form completion. He reports situational anxiety. He is considering starting a new job. He does not want to quit his current job but would like to consider taking a leave of absence for 2 months. He takes Xanax as needed. He is not currently seeing a therapist. This has been an intermittent issue for him but seems worse over the last few months.   No past medical history on file.  Current Outpatient Medications  Medication Sig Dispense Refill   ALPRAZolam (XANAX) 0.5 MG tablet Take 1 tablet (0.5 mg total) by mouth daily as needed for anxiety. 10 tablet 0   clobetasol ointment (TEMOVATE) 0.05 % Apply 1 application. topically 2 (two) times daily. 100 g 1   No current facility-administered medications for this visit.    No Known Allergies  Family History  Problem Relation Age of Onset   Cancer Maternal Uncle        throat   Prostate cancer Father    Diabetes Neg Hx    Early death Neg Hx    Heart disease Neg Hx    Stroke Neg Hx     Social History   Socioeconomic History   Marital status: Married    Spouse name: Not on file   Number of children: Not on file   Years of education: Not on file   Highest education level: Not on file  Occupational History   Not on file  Tobacco Use   Smoking status: Never   Smokeless tobacco: Never  Vaping Use   Vaping Use: Never used  Substance and Sexual Activity   Alcohol use:  Yes    Alcohol/week: 1.0 standard drink of alcohol    Types: 1 Shots of liquor per week    Comment: occasional   Drug use: No   Sexual activity: Yes  Other Topics Concern   Not on file  Social History Narrative   Not on file   Social Determinants of Health   Financial Resource Strain: Not on file  Food Insecurity: Not on file  Transportation Needs: Not on file  Physical Activity: Not on file  Stress: Not on file  Social Connections: Not on file  Intimate Partner Violence: Not on file     Constitutional: Denies fever, malaise, fatigue, headache or abrupt weight changes.  Respiratory: Denies difficulty breathing, shortness of breath, cough or sputum production.   Cardiovascular: Denies chest pain, chest tightness, palpitations or swelling in the hands or feet.  Neurological: Denies dizziness, difficulty with memory, difficulty with speech or problems with balance and coordination.  Psych: Patient reports anxiety.  Denies depression, SI/HI.  No other specific complaints in a complete review of systems (except as listed in HPI above).  Observations/Objective:  Wt Readings from Last 3 Encounters:  01/24/22 239 lb (108.4 kg)  01/15/22 239 lb (108.4 kg)  11/06/21 237 lb (107.5 kg)    General: Appears his stated age, well developed, well nourished in NAD. Pulmonary/Chest: Normal effort. No  respiratory distress. Neurological: Alert and oriented.  Psychiatric: Mood and affect normal. Mildly anxious appearing. Judgment and thought content normal.   BMET    Component Value Date/Time   NA 137 01/15/2022 1417   K 3.8 01/15/2022 1417   CL 104 01/15/2022 1417   CO2 25 01/15/2022 1417   GLUCOSE 88 01/15/2022 1417   BUN 9 01/15/2022 1417   CREATININE 0.87 01/15/2022 1417   CALCIUM 9.0 01/15/2022 1417    Lipid Panel     Component Value Date/Time   CHOL 112 01/15/2022 1417   TRIG 72 01/15/2022 1417   HDL 36 (L) 01/15/2022 1417   CHOLHDL 3.1 01/15/2022 1417   VLDL 16.4  09/12/2020 1534   LDLCALC 61 01/15/2022 1417    CBC    Component Value Date/Time   WBC 5.1 01/15/2022 1417   RBC 4.54 01/15/2022 1417   HGB 12.5 (L) 01/15/2022 1417   HCT 37.0 (L) 01/15/2022 1417   PLT 76 (L) 01/15/2022 1417   MCV 81.5 01/15/2022 1417   MCH 27.5 01/15/2022 1417   MCHC 33.8 01/15/2022 1417   RDW 16.3 (H) 01/15/2022 1417    Hgb A1C Lab Results  Component Value Date   HGBA1C 4.1 09/13/2020       Assessment and Plan:  Encounter for Form Completion, Situational Anxiety:  He will send FMLA forms through mychart for completion starting 7/26 for a continuous 8 week period Continue Xanax as needed Support offered  Follow Up Instructions:    I discussed the assessment and treatment plan with the patient. The patient was provided an opportunity to ask questions and all were answered. The patient agreed with the plan and demonstrated an understanding of the instructions.   The patient was advised to call back or seek an in-person evaluation if the symptoms worsen or if the condition fails to improve as anticipated.  RTC in 5 months for follow-up of chronic conditions  Nicki Reaper, NP

## 2022-03-14 ENCOUNTER — Encounter: Payer: Self-pay | Admitting: Internal Medicine

## 2022-03-14 NOTE — Telephone Encounter (Signed)
Okay for work note stating that he will be going back on 9/21.

## 2022-03-15 NOTE — Telephone Encounter (Signed)
Note sent. (See previous My Chart Message.)  Thanks,   -Vernona Rieger

## 2022-04-17 ENCOUNTER — Encounter: Payer: Self-pay | Admitting: Internal Medicine

## 2022-04-17 NOTE — Telephone Encounter (Signed)
Okay for work note to return on September 9

## 2022-07-18 ENCOUNTER — Encounter: Payer: Self-pay | Admitting: Internal Medicine

## 2022-07-18 ENCOUNTER — Ambulatory Visit (INDEPENDENT_AMBULATORY_CARE_PROVIDER_SITE_OTHER): Payer: 59 | Admitting: Internal Medicine

## 2022-07-18 VITALS — BP 120/84 | HR 82 | Temp 96.9°F | Wt 244.0 lb

## 2022-07-18 DIAGNOSIS — R7309 Other abnormal glucose: Secondary | ICD-10-CM

## 2022-07-18 DIAGNOSIS — D696 Thrombocytopenia, unspecified: Secondary | ICD-10-CM

## 2022-07-18 DIAGNOSIS — L409 Psoriasis, unspecified: Secondary | ICD-10-CM

## 2022-07-18 DIAGNOSIS — G8929 Other chronic pain: Secondary | ICD-10-CM

## 2022-07-18 DIAGNOSIS — M545 Low back pain, unspecified: Secondary | ICD-10-CM | POA: Diagnosis not present

## 2022-07-18 DIAGNOSIS — E6609 Other obesity due to excess calories: Secondary | ICD-10-CM

## 2022-07-18 DIAGNOSIS — F40243 Fear of flying: Secondary | ICD-10-CM

## 2022-07-18 DIAGNOSIS — Z136 Encounter for screening for cardiovascular disorders: Secondary | ICD-10-CM

## 2022-07-18 DIAGNOSIS — D508 Other iron deficiency anemias: Secondary | ICD-10-CM | POA: Diagnosis not present

## 2022-07-18 DIAGNOSIS — Z6834 Body mass index (BMI) 34.0-34.9, adult: Secondary | ICD-10-CM

## 2022-07-18 DIAGNOSIS — M25512 Pain in left shoulder: Secondary | ICD-10-CM

## 2022-07-18 MED ORDER — PREDNISONE 10 MG PO TABS: ORAL_TABLET | ORAL | 0 refills | Status: DC

## 2022-07-18 MED ORDER — CLOBETASOL PROPIONATE 0.05 % EX OINT: 1.0000 | TOPICAL_OINTMENT | Freq: Two times a day (BID) | CUTANEOUS | 1 refills | Status: DC

## 2022-07-18 NOTE — Assessment & Plan Note (Signed)
CBC today.  

## 2022-07-18 NOTE — Progress Notes (Signed)
Subjective:    Patient ID: Patrick Hahn, male    DOB: 1981-02-21, 41 y.o.   MRN: [id number]  HPI  Patient presents to clinic today for follow-up of chronic conditions.  Psoriasis: Managed with Clobetasol ointment.  He does not follow with dermatology.  Anxiety with Flying: He takes Xanax only as needed when flying.  Iron Deficiency Anemia: His last H/H was 12.[birth year], 01/2022.  He is not taking any oral Iron at this time.  He does not follow with hematology.  Chronic Back Pain: He reports this flares intermittently.  He will take Ibuprofen or Tylenol OTC as needed.  Lumbar x-ray from 10/2015 reviewed.  He does not follow with orthopedics.  Thrombocytosis: His last platelet count was 76, 01/2022.  He is not following with hematology at this time.  He also reports left shoulder pain.  This started about 1 month ago. He describes the pain as sore and achy. The pain does not radiate. It is worse with movement and laying on the left side. He denies numbness, tingling or weakness to the left upper extremity. He denies an injury to the area. He has not taken anything for this.   Review of Systems     No past medical history on file.  Current Outpatient Medications  Medication Sig Dispense Refill   ALPRAZolam (XANAX) 0.5 MG tablet Take 1 tablet (0.5 mg total) by mouth daily as needed for anxiety. 10 tablet 0   clobetasol ointment (TEMOVATE) 0.05 % Apply 1 application. topically 2 (two) times daily. 100 g 1   No current facility-administered medications for this visit.    No Known Allergies  Family History  Problem Relation Age of Onset   Cancer Maternal Uncle        throat   Prostate cancer Father    Diabetes Neg Hx    Early death Neg Hx    Heart disease Neg Hx    Stroke Neg Hx     Social History   Socioeconomic History   Marital status: Married    Spouse name: Not on file   Number of children: Not on file   Years of education: Not on file   Highest education level: Not on  file  Occupational History   Not on file  Tobacco Use   Smoking status: Never   Smokeless tobacco: Never  Vaping Use   Vaping Use: Never used  Substance and Sexual Activity   Alcohol use: Yes    Alcohol/week: 1.0 standard drink of alcohol    Types: 1 Shots of liquor per week    Comment: occasional   Drug use: No   Sexual activity: Yes  Other Topics Concern   Not on file  Social History Narrative   Not on file   Social Determinants of Health   Financial Resource Strain: Not on file  Food Insecurity: Not on file  Transportation Needs: Not on file  Physical Activity: Not on file  Stress: Not on file  Social Connections: Not on file  Intimate Partner Violence: Not on file     Constitutional: Denies fever, malaise, fatigue, headache or abrupt weight changes.  HEENT: Denies eye pain, eye redness, ear pain, ringing in the ears, wax buildup, runny nose, nasal congestion, bloody nose, or sore throat. Respiratory: Denies difficulty breathing, shortness of breath, cough or sputum production.   Cardiovascular: Denies chest pain, chest tightness, palpitations or swelling in the hands or feet.  Gastrointestinal: Denies abdominal pain, bloating, constipation, diarrhea or blood in  the stool.  GU: Denies urgency, frequency, pain with urination, burning sensation, blood in urine, odor or discharge. Musculoskeletal: Patient reports chronic low back pain, left shoulder pain.  Denies decrease in range of motion, difficulty with gait, or joint swelling.  Skin: Denies redness, rashes, lesions or ulcercations.  Neurological: Denies dizziness, difficulty with memory, difficulty with speech or problems with balance and coordination.  Psych: Patient reports intermittent anxiety.  Denies depression, SI/HI.  No other specific complaints in a complete review of systems (except as listed in HPI above).  Objective:   Physical Exam  BP 120/84 (BP Location: Right Arm, Patient Position: Sitting, Cuff  Size: Large)   Pulse 82   Temp (!) 96.9 F (36.1 C) (Temporal)   Wt 244 lb (110.7 kg)   SpO2 99%   BMI 34.03 kg/m   Wt Readings from Last 3 Encounters:  01/24/22 239 lb (108.4 kg)  01/15/22 239 lb (108.4 kg)  11/06/21 237 lb (107.5 kg)    General: Appears his stated age, obese, in NAD. Skin: Warm, dry and intact.  Scattered dry, scaly patches noted of bilateral upper extremities. HEENT: Head: normal shape and size; Eyes: sclera white, no icterus, conjunctiva pink, PERRLA and EOMs intact;  Cardiovascular: Normal rate and rhythm. S1,S2 noted.  No murmur, rubs or gallops noted. No JVD or BLE edema.  Pulmonary/Chest: Normal effort and positive vesicular breath sounds. No respiratory distress. No wheezes, rales or ronchi noted.  Musculoskeletal: Normal internal and external rotation of the left shoulder.  Negative drop can test on the left.  Pain with palpation over the left AC joint.  Shoulder shrug equal.  Strength 5/5 BUE.  Handgrips equal.  No pain with palpation of lumbar spine.  No difficulty with gait.  Neurological: Alert and oriented. Coordination normal.  Psychiatric: Mood and affect normal. Behavior is normal. Judgment and thought content normal.     BMET    Component Value Date/Time   NA 137 01/15/2022 1417   K 3.8 01/15/2022 1417   CL 104 01/15/2022 1417   CO2 25 01/15/2022 1417   GLUCOSE 88 01/15/2022 1417   BUN 9 01/15/2022 1417   CREATININE 0.87 01/15/2022 1417   CALCIUM 9.0 01/15/2022 1417    Lipid Panel     Component Value Date/Time   CHOL 112 01/15/2022 1417   TRIG 72 01/15/2022 1417   HDL 36 (L) 01/15/2022 1417   CHOLHDL 3.1 01/15/2022 1417   VLDL 16.4 09/12/2020 1534   LDLCALC 61 01/15/2022 1417    CBC    Component Value Date/Time   WBC 5.1 01/15/2022 1417   RBC 4.54 01/15/2022 1417   HGB 12.5 (L) 01/15/2022 1417   HCT 37.0 (L) 01/15/2022 1417   PLT 76 (L) 01/15/2022 1417   MCV 81.5 01/15/2022 1417   MCH 27.5 01/15/2022 1417   MCHC 33.8  01/15/2022 1417   RDW 16.3 (H) 01/15/2022 1417    Hgb A1C Lab Results  Component Value Date   HGBA1C 4.1 09/13/2020            Assessment & Plan:   Left Shoulder Pain:  Could be AC joint arthritis Rx for Pred taper x 6 days If no improvement would consider x-ray left shoulder   RTC in 6 months for annual exam Nicki Reaper, NP

## 2022-07-18 NOTE — Assessment & Plan Note (Signed)
CBC today Consider referral to hematology if platelets <80

## 2022-07-18 NOTE — Assessment & Plan Note (Signed)
Encourage regular stretching and core strengthening Okay to take ibuprofen Tylenol OTC as needed

## 2022-07-18 NOTE — Patient Instructions (Signed)

## 2022-07-18 NOTE — Assessment & Plan Note (Signed)
Encouraged him to use Colazal only 2 times a day as prescribed, refilled today

## 2022-07-18 NOTE — Assessment & Plan Note (Signed)
Continue Xanax as needed 

## 2022-07-18 NOTE — Assessment & Plan Note (Signed)
Encourage diet and exercise for weight loss 

## 2022-07-19 LAB — COMPLETE METABOLIC PANEL WITH GFR
AG Ratio: 1.3 (calc) (ref 1.0–2.5)
ALT: 38 U/L (ref 9–46)
AST: 29 U/L (ref 10–40)
Albumin: 4.1 g/dL (ref 3.6–5.1)
Alkaline phosphatase (APISO): 64 U/L (ref 36–130)
BUN: 7 mg/dL (ref 7–25)
CO2: 28 mmol/L (ref 20–32)
Calcium: 8.8 mg/dL (ref 8.6–10.3)
Chloride: 105 mmol/L (ref 98–110)
Creat: 0.99 mg/dL (ref 0.60–1.29)
Globulin: 3.1 g/dL (calc) (ref 1.9–3.7)
Glucose, Bld: 92 mg/dL (ref 65–99)
Potassium: 3.9 mmol/L (ref 3.5–5.3)
Sodium: 139 mmol/L (ref 135–146)
Total Bilirubin: 1.7 mg/dL — ABNORMAL HIGH (ref 0.2–1.2)
Total Protein: 7.2 g/dL (ref 6.1–8.1)
eGFR: 99 mL/min/{1.73_m2} (ref >=60)

## 2022-07-19 LAB — CBC
HCT: 36.2 % — ABNORMAL LOW (ref 38.5–50.0)
Hemoglobin: 12.5 g/dL — ABNORMAL LOW (ref 13.2–17.1)
MCH: 27.7 pg (ref 27.0–33.0)
MCHC: 34.5 g/dL (ref 32.0–36.0)
MCV: 80.3 fL (ref 80.0–100.0)
MPV: 11.8 fL (ref 7.5–12.5)
Platelets: 114 10*3/uL — ABNORMAL LOW (ref 140–400)
RBC: 4.51 10*6/uL (ref 4.20–5.80)
RDW: 17.1 % — ABNORMAL HIGH (ref 11.0–15.0)
WBC: 5.3 10*3/uL (ref 3.8–10.8)

## 2022-07-19 LAB — HEMOGLOBIN A1C
Hgb A1c MFr Bld: 4.5 % of total Hgb (ref <5.7)
Mean Plasma Glucose: 82 mg/dL
eAG (mmol/L): 4.6 mmol/L

## 2022-07-19 LAB — LIPID PANEL
Cholesterol: 104 mg/dL (ref <200)
HDL: 38 mg/dL — ABNORMAL LOW (ref >=40)
LDL Cholesterol (Calc): 52 mg/dL (calc)
Non-HDL Cholesterol (Calc): 66 mg/dL (calc) (ref <130)
Total CHOL/HDL Ratio: 2.7 (calc) (ref <5.0)
Triglycerides: 48 mg/dL (ref <150)

## 2022-08-27 ENCOUNTER — Encounter: Payer: Self-pay | Admitting: Internal Medicine

## 2022-08-27 ENCOUNTER — Ambulatory Visit (INDEPENDENT_AMBULATORY_CARE_PROVIDER_SITE_OTHER): Payer: 59 | Admitting: Internal Medicine

## 2022-08-27 VITALS — BP 126/84 | HR 101 | Temp 96.6°F | Wt 241.0 lb

## 2022-08-27 DIAGNOSIS — H65112 Acute and subacute allergic otitis media (mucoid) (sanguinous) (serous), left ear: Secondary | ICD-10-CM

## 2022-08-27 DIAGNOSIS — R509 Fever, unspecified: Secondary | ICD-10-CM | POA: Diagnosis not present

## 2022-08-27 DIAGNOSIS — H6692 Otitis media, unspecified, left ear: Secondary | ICD-10-CM

## 2022-08-27 DIAGNOSIS — J069 Acute upper respiratory infection, unspecified: Secondary | ICD-10-CM | POA: Diagnosis not present

## 2022-08-27 LAB — POCT INFLUENZA A/B
Influenza A, POC: NEGATIVE
Influenza B, POC: NEGATIVE

## 2022-08-27 MED ORDER — PREDNISONE 10 MG PO TABS: ORAL_TABLET | ORAL | 0 refills | Status: DC

## 2022-08-27 MED ORDER — AMOXICILLIN-POT CLAVULANATE 875-125 MG PO TABS: 1.0000 | ORAL_TABLET | Freq: Two times a day (BID) | ORAL | 0 refills | Status: DC

## 2022-08-27 NOTE — Patient Instructions (Signed)
Otitis Media, Adult  Otitis media is a condition in which the middle ear is red and swollen (inflamed) and full of fluid. The middle ear is the part of the ear that contains bones for hearing as well as air that helps send sounds to the brain. The condition usually goes away on its own. What are the causes? This condition is caused by a blockage in the eustachian tube. This tube connects the middle ear to the back of the nose. It normally allows air into the middle ear. The blockage is caused by fluid or swelling. Problems that can cause blockage include: A cold or infection that affects the nose, mouth, or throat. Allergies. An irritant, such as tobacco smoke. Adenoids that have become large. The adenoids are soft tissue located in the back of the throat, behind the nose and the roof of the mouth. Growth or swelling in the upper part of the throat, just behind the nose (nasopharynx). Damage to the ear caused by a change in pressure. This is called barotrauma. What increases the risk? You are more likely to develop this condition if you: Smoke or are exposed to tobacco smoke. Have an opening in the roof of your mouth (cleft palate). Have acid reflux. Have problems in your body's defense system (immune system). What are the signs or symptoms? Symptoms of this condition include: Ear pain. Fever. Problems with hearing. Being tired. Fluid leaking from the ear. Ringing in the ear. How is this treated? This condition can go away on its own within 3-5 days. But if the condition is caused by germs (bacteria) and does not go away on its own, or if it keeps coming back, your doctor may: Give you antibiotic medicines. Give you medicines for pain. Follow these instructions at home: Take over-the-counter and prescription medicines only as told by your doctor. If you were prescribed an antibiotic medicine, take it as told by your doctor. Do not stop taking it even if you start to feel better. Keep  all follow-up visits. Contact a doctor if: You have bleeding from your nose. There is a lump on your neck. You are not feeling better in 5 days. You feel worse instead of better. Get help right away if: You have pain that is not helped with medicine. You have swelling, redness, or pain around your ear. You get a stiff neck. You cannot move part of your face (paralysis). You notice that the bone behind your ear hurts when you touch it. You get a very bad headache. Summary Otitis media means that the middle ear is red, swollen, and full of fluid. This condition usually goes away on its own. If the problem does not go away, treatment may be needed. You may be given medicines to treat the infection or to treat your pain. If you were prescribed an antibiotic medicine, take it as told by your doctor. Do not stop taking it even if you start to feel better. Keep all follow-up visits. This information is not intended to replace advice given to you by your health care provider. Make sure you discuss any questions you have with your health care provider. Document Revised: 11/06/2020 Document Reviewed: 11/06/2020 Elsevier Patient Education  [place].

## 2022-08-27 NOTE — Progress Notes (Signed)
HPI  Pt presents to the clinic today with c/o left ear fullness, sore throat and cough.  He reports this started 1 week ago.  He denies difficulty swallowing.  The cough is nonproductive.  He denies headache, runny nose, nasal congestion, shortness of breath, nausea, vomiting or diarrhea.  He did run a fever one days but denies chills or body aches. 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 He took a home covid test which was negative. He has not had sick contacts that he is aware of.   Review of Systems     No past medical history on file.  Family History  Problem Relation Age of Onset   Cancer Maternal Uncle        throat   Prostate cancer Father    Diabetes Neg Hx    Early death Neg Hx    Heart disease Neg Hx    Stroke Neg Hx     Social History   Socioeconomic History   Marital status: Married    Spouse name: Not on file   Number of children: Not on file   Years of education: Not on file   Highest education level: Not on file  Occupational History   Not on file  Tobacco Use   Smoking status: Never   Smokeless tobacco: Never  Vaping Use   Vaping Use: Never used  Substance and Sexual Activity   Alcohol use: Yes    Alcohol/week: 1.0 standard drink of alcohol    Types: 1 Shots of liquor per week    Comment: occasional   Drug use: No   Sexual activity: Yes  Other Topics Concern   Not on file  Social History Narrative   Not on file   Social Determinants of Health   Financial Resource Strain: Not on file  Food Insecurity: Not on file  Transportation Needs: Not on file  Physical Activity: Not on file  Stress: Not on file  Social Connections: Not on file  Intimate Partner Violence: Not on file    No Known Allergies   Constitutional: Positive fever. Denies headache, fatigue, abrupt weight changes.  HEENT:  Positive left ear pain and sore throat. Denies eye redness, eye pain, pressure behind the eyes, facial pain, nasal  congestion, ringing in the ears, wax buildup, runny nose or bloody nose. Respiratory: Positive cough. Denies difficulty breathing or shortness of breath.  Cardiovascular: Denies chest pain, chest tightness, palpitations or swelling in the hands or feet.   No other specific complaints in a complete review of systems (except as listed in HPI above).  Objective:   BP 126/84 (BP Location: Left Arm, Patient Position: Sitting, Cuff Size: Normal)   Pulse (!) 101   Temp (!) 96.6 F (35.9 C) (Temporal)   Wt 241 lb (109.3 kg)   SpO2 100%   BMI 33.61 kg/m   Wt Readings from Last 3 Encounters:  07/18/22 244 lb (110.7 kg)  01/24/22 239 lb (108.4 kg)  01/15/22 239 lb (108.4 kg)     General: Appears his stated age, appears unwell but in NAD. HEENT: Head: normal shape and size, no sinus tenderness noted; Eyes: sclera white, no icterus, conjunctiva pink; Left Ear: Red and bulging, + mucoid effusion; Throat/Mouth: + PND. Teeth present, mucosa erythematous and moist, no exudate noted, no lesions or ulcerations noted.  Neck: Cervical adenopathy noted on the right. Cardiovascular: Tachycardic with normal rhythm. S1,S2 noted.  No murmur, rubs or gallops noted.  Pulmonary/Chest: Normal effort and positive vesicular breath sounds. No respiratory distress. No wheezes, rales or ronchi noted.       Assessment & Plan:   Viral Upper Respiratory Infection with Cough, Left Otitis Media:  Rapid flu negative Get some rest and drink plenty of water Do salt water gargles for the sore throat Rx for Augmentin 875-125 mg twice daily x 10 days Rx for Pred for x 6 days Work note provided  RTC in 5 months for annual exam   Webb Silversmith, NP

## 2022-09-02 ENCOUNTER — Ambulatory Visit (INDEPENDENT_AMBULATORY_CARE_PROVIDER_SITE_OTHER): Payer: 59 | Admitting: Internal Medicine

## 2022-09-02 ENCOUNTER — Encounter: Payer: Self-pay | Admitting: Internal Medicine

## 2022-09-02 VITALS — BP 126/76 | HR 94 | Temp 96.9°F | Wt 246.0 lb

## 2022-09-02 DIAGNOSIS — J028 Acute pharyngitis due to other specified organisms: Secondary | ICD-10-CM

## 2022-09-02 DIAGNOSIS — B9789 Other viral agents as the cause of diseases classified elsewhere: Secondary | ICD-10-CM | POA: Diagnosis not present

## 2022-09-02 NOTE — Patient Instructions (Signed)

## 2022-09-02 NOTE — Progress Notes (Signed)
Subjective:    Patient ID: Patrick Hahn, male    DOB: May 06, 1981, 42 y.o.   MRN: [id number]  HPI  Patient presents to clinic today with complaint of sore throat.  This started yesterday. He is not having difficulty swallowing. He reports associated cough but denies headache, runny nose, nasal congestion, ear pain or shortness of breath. He denies fever, chills or body aches. He has Tylenol Cold OTC with minimal relief of symptoms. He was recently treated with Augmentin x 10 days for a left otitis media but he admits he did not finish this course of antibiotics.  Review of Systems  No past medical history on file.  Current Outpatient Medications  Medication Sig Dispense Refill   ALPRAZolam (XANAX) 0.5 MG tablet Take 1 tablet (0.5 mg total) by mouth daily as needed for anxiety. 10 tablet 0   amoxicillin-clavulanate (AUGMENTIN) 875-125 MG tablet Take 1 tablet by mouth 2 (two) times daily. 20 tablet 0   clobetasol ointment (TEMOVATE) 6.96 % Apply 1 Application topically 2 (two) times daily. 100 g 1   predniSONE (DELTASONE) 10 MG tablet Take 6 tabs on day 1, 5 tabs on day 2, 4 tabs on day 3, 3 tabs on day 4, 2 tabs on day 5, 1 tab on day 6 21 tablet 0   No current facility-administered medications for this visit.    No Known Allergies  Family History  Problem Relation Age of Onset   Cancer Maternal Uncle        throat   Prostate cancer Father    Diabetes Neg Hx    Early death Neg Hx    Heart disease Neg Hx    Stroke Neg Hx     Social History   Socioeconomic History   Marital status: Married    Spouse name: Not on file   Number of children: Not on file   Years of education: Not on file   Highest education level: Not on file  Occupational History   Not on file  Tobacco Use   Smoking status: Never   Smokeless tobacco: Never  Vaping Use   Vaping Use: Never used  Substance and Sexual Activity   Alcohol use: Yes    Alcohol/week: 1.0 standard drink of alcohol    Types: 1  Shots of liquor per week    Comment: occasional   Drug use: No   Sexual activity: Yes  Other Topics Concern   Not on file  Social History Narrative   Not on file   Social Determinants of Health   Financial Resource Strain: Not on file  Food Insecurity: Not on file  Transportation Needs: Not on file  Physical Activity: Not on file  Stress: Not on file  Social Connections: Not on file  Intimate Partner Violence: Not on file     Constitutional: Denies fever, malaise, fatigue, headache or abrupt weight changes.  HEENT: Patient reports sore throat.  Denies eye pain, eye redness, ear pain, ringing in the ears, wax buildup, runny nose, nasal congestion, bloody nose. Respiratory: Patient reports cough.  Denies difficulty breathing, shortness of breath, or sputum production.   Cardiovascular: Denies chest pain, chest tightness, palpitations or swelling in the hands or feet.  Gastrointestinal: Denies abdominal pain, bloating, constipation, diarrhea or blood in the stool.  GU: Denies urgency, frequency, pain with urination, burning sensation, blood in urine, odor or discharge. Musculoskeletal: Denies decrease in range of motion, difficulty with gait, muscle pain or joint pain and swelling.  Skin:  Denies redness, rashes, lesions or ulcercations.  Neurological: Denies dizziness, difficulty with memory, difficulty with speech or problems with balance and coordination.  Psych: Denies anxiety, depression, SI/HI.  No other specific complaints in a complete review of systems (except as listed in HPI above).     Objective:    BP 126/76 (BP Location: Left Arm, Patient Position: Sitting, Cuff Size: Large)   Pulse 94   Temp (!) 96.9 F (36.1 C) (Temporal)   Wt 246 lb (111.6 kg)   SpO2 99%   BMI 34.31 kg/m   Wt Readings from Last 3 Encounters:  08/27/22 241 lb (109.3 kg)  07/18/22 244 lb (110.7 kg)  01/24/22 239 lb (108.4 kg)    General: Appears his stated age, obese in NAD. Skin: Warm,  dry and intact. No rashes noted. HEENT: Head: normal shape and size, no sinus tenderness noted; Eyes: sclera white, no icterus, conjunctiva pink, PERRLA and EOMs intact; Ears: Tm's gray and intact, normal light reflex;  Throat/Mouth: Teeth present, mucosa pink and moist, no exudate, lesions or ulcerations noted.  Neck: No adenopathy noted. Cardiovascular: Normal rate and rhythm. S1,S2 noted.  No murmur, rubs or gallops noted.  Pulmonary/Chest: Normal effort and positive vesicular breath sounds. No respiratory distress. No wheezes, rales or ronchi noted.  Neurological: Alert and oriented.   BMET    Component Value Date/Time   NA 139 07/18/2022 1117   K 3.9 07/18/2022 1117   CL 105 07/18/2022 1117   CO2 28 07/18/2022 1117   GLUCOSE 92 07/18/2022 1117   BUN 7 07/18/2022 1117   CREATININE 0.99 07/18/2022 1117   CALCIUM 8.8 07/18/2022 1117    Lipid Panel     Component Value Date/Time   CHOL 104 07/18/2022 1117   TRIG 48 07/18/2022 1117   HDL 38 (L) 07/18/2022 1117   CHOLHDL 2.7 07/18/2022 1117   VLDL 16.4 09/12/2020 1534   LDLCALC 52 07/18/2022 1117    CBC    Component Value Date/Time   WBC 5.3 07/18/2022 1117   RBC 4.51 07/18/2022 1117   HGB 12.5 (L) 07/18/2022 1117   HCT 36.2 (L) 07/18/2022 1117   PLT 114 (L) 07/18/2022 1117   MCV 80.3 07/18/2022 1117   MCH 27.7 07/18/2022 1117   MCHC 34.5 07/18/2022 1117   RDW 17.1 (H) 07/18/2022 1117    Hgb A1C Lab Results  Component Value Date   HGBA1C 4.5 07/18/2022            Assessment & Plan:   Sore Throat:  No indication for strep testing at this time Recommend Zyrtec 10 mg daily, Ibuprofen as needed and salt water gargles Advised him to go ahead and finish out the Augmentin that was previously prescribed Work note provided  RTC in 5 months for your annual exam Webb Silversmith, NP

## 2023-02-24 ENCOUNTER — Encounter: Payer: Self-pay | Admitting: Internal Medicine

## 2023-02-24 ENCOUNTER — Ambulatory Visit (INDEPENDENT_AMBULATORY_CARE_PROVIDER_SITE_OTHER): Payer: 59 | Admitting: Internal Medicine

## 2023-02-24 VITALS — BP 112/72 | HR 81 | Temp 95.9°F | Ht 71.5 in | Wt 246.0 lb

## 2023-02-24 DIAGNOSIS — E6609 Other obesity due to excess calories: Secondary | ICD-10-CM

## 2023-02-24 DIAGNOSIS — Z6833 Body mass index (BMI) 33.0-33.9, adult: Secondary | ICD-10-CM

## 2023-02-24 DIAGNOSIS — Z0289 Encounter for other administrative examinations: Secondary | ICD-10-CM | POA: Diagnosis not present

## 2023-02-24 DIAGNOSIS — M25521 Pain in right elbow: Secondary | ICD-10-CM

## 2023-02-24 DIAGNOSIS — M25512 Pain in left shoulder: Secondary | ICD-10-CM

## 2023-02-24 DIAGNOSIS — R7309 Other abnormal glucose: Secondary | ICD-10-CM | POA: Diagnosis not present

## 2023-02-24 DIAGNOSIS — Z136 Encounter for screening for cardiovascular disorders: Secondary | ICD-10-CM | POA: Diagnosis not present

## 2023-02-24 DIAGNOSIS — G8929 Other chronic pain: Secondary | ICD-10-CM | POA: Insufficient documentation

## 2023-02-24 DIAGNOSIS — Z0001 Encounter for general adult medical examination with abnormal findings: Secondary | ICD-10-CM | POA: Diagnosis not present

## 2023-02-24 DIAGNOSIS — M545 Low back pain, unspecified: Secondary | ICD-10-CM

## 2023-02-24 LAB — CBC
MCH: 27.3 pg (ref 27.0–33.0)
MCHC: 33.5 g/dL (ref 32.0–36.0)
MCV: 81.4 fL (ref 80.0–100.0)
WBC: 5.2 10*3/uL (ref 3.8–10.8)

## 2023-02-24 NOTE — Patient Instructions (Signed)
Health Maintenance, Male Adopting a healthy lifestyle and getting preventive care are important in promoting health and wellness. Ask your health care provider about: The right schedule for you to have regular tests and exams. Things you can do on your own to prevent diseases and keep yourself healthy. What should I know about diet, weight, and exercise? Eat a healthy diet  Eat a diet that includes plenty of vegetables, fruits, low-fat dairy products, and lean protein. Do not eat a lot of foods that are high in solid fats, added sugars, or sodium. Maintain a healthy weight Body mass index (BMI) is a measurement that can be used to identify possible weight problems. It estimates body fat based on height and weight. Your health care provider can help determine your BMI and help you achieve or maintain a healthy weight. Get regular exercise Get regular exercise. This is one of the most important things you can do for your health. Most adults should: Exercise for at least 150 minutes each week. The exercise should increase your heart rate and make you sweat (moderate-intensity exercise). Do strengthening exercises at least twice a week. This is in addition to the moderate-intensity exercise. Spend less time sitting. Even light physical activity can be beneficial. Watch cholesterol and blood lipids Have your blood tested for lipids and cholesterol at 41 years of age, then have this test every 5 years. You may need to have your cholesterol levels checked more often if: Your lipid or cholesterol levels are high. You are older than 40 years of age. You are at high risk for heart disease. What should I know about cancer screening? Many types of cancers can be detected early and may often be prevented. Depending on your health history and family history, you may need to have cancer screening at various ages. This may include screening for: Colorectal cancer. Prostate cancer. Skin cancer. Lung  cancer. What should I know about heart disease, diabetes, and high blood pressure? Blood pressure and heart disease High blood pressure causes heart disease and increases the risk of stroke. This is more likely to develop in people who have high blood pressure readings or are overweight. Talk with your health care provider about your target blood pressure readings. Have your blood pressure checked: Every 3-5 years if you are 18-39 years of age. Every year if you are 40 years old or older. If you are between the ages of 65 and 75 and are a current or former smoker, ask your health care provider if you should have a one-time screening for abdominal aortic aneurysm (AAA). Diabetes Have regular diabetes screenings. This checks your fasting blood sugar level. Have the screening done: Once every three years after age 45 if you are at a normal weight and have a low risk for diabetes. More often and at a younger age if you are overweight or have a high risk for diabetes. What should I know about preventing infection? Hepatitis B If you have a higher risk for hepatitis B, you should be screened for this virus. Talk with your health care provider to find out if you are at risk for hepatitis B infection. Hepatitis C Blood testing is recommended for: Everyone born from 1945 through 1965. Anyone with known risk factors for hepatitis C. Sexually transmitted infections (STIs) You should be screened each year for STIs, including gonorrhea and chlamydia, if: You are sexually active and are younger than 41 years of age. You are older than 41 years of age and your   health care provider tells you that you are at risk for this type of infection. Your sexual activity has changed since you were last screened, and you are at increased risk for chlamydia or gonorrhea. Ask your health care provider if you are at risk. Ask your health care provider about whether you are at high risk for HIV. Your health care provider  may recommend a prescription medicine to help prevent HIV infection. If you choose to take medicine to prevent HIV, you should first get tested for HIV. You should then be tested every 3 months for as long as you are taking the medicine. Follow these instructions at home: Alcohol use Do not drink alcohol if your health care provider tells you not to drink. If you drink alcohol: Limit how much you have to 0-2 drinks a day. Know how much alcohol is in your drink. In the [place] one drink equals one 12 oz bottle of beer (355 mL), one 5 oz glass of wine (148 mL), or one 1 oz glass of hard liquor (44 mL). Lifestyle Do not use any products that contain nicotine or tobacco. These products include cigarettes, chewing tobacco, and vaping devices, such as e-cigarettes. If you need help quitting, ask your health care provider. Do not use street drugs. Do not share needles. Ask your health care provider for help if you need support or information about quitting drugs. General instructions Schedule regular health, dental, and eye exams. Stay current with your vaccines. Tell your health care provider if: You often feel depressed. You have ever been abused or do not feel safe at home. Summary Adopting a healthy lifestyle and getting preventive care are important in promoting health and wellness. Follow your health care provider's instructions about healthy diet, exercising, and getting tested or screened for diseases. Follow your health care provider's instructions on monitoring your cholesterol and blood pressure. This information is not intended to replace advice given to you by your health care provider. Make sure you discuss any questions you have with your health care provider. Document Revised: 12/18/2020 Document Reviewed: 12/18/2020 Elsevier Patient Education  2024 Elsevier Inc[employer]  

## 2023-02-24 NOTE — Assessment & Plan Note (Signed)
Encourage diet and exercise for weight loss 

## 2023-02-24 NOTE — Progress Notes (Signed)
Subjective:    Patient ID: Patrick Hahn, male    DOB: 1981-01-17, 42 y.o.   MRN: [id number]  HPI  Patient presents to clinic today for his annual exam.  He would also like his FMLA forms completed.  This is a recertification for back pain and chronic left shoulder pain.  Flu: Never Tetanus: 02/2016 COVID: X 2 Dentist: biannually Vision screening: annually  Diet: He does eat meat. He consumes some fruits and veggies. He does eat fried foods. He drinks mostly infused water, soda. Exercise: None  Review of Systems     No past medical history on file.  Current Outpatient Medications  Medication Sig Dispense Refill   ALPRAZolam (XANAX) 0.5 MG tablet Take 1 tablet (0.5 mg total) by mouth daily as needed for anxiety. 10 tablet 0   clobetasol ointment (TEMOVATE) 0.05 % Apply 1 Application topically 2 (two) times daily. 100 g 1   No current facility-administered medications for this visit.    No Known Allergies  Family History  Problem Relation Age of Onset   Cancer Maternal Uncle        throat   Prostate cancer Father    Diabetes Neg Hx    Early death Neg Hx    Heart disease Neg Hx    Stroke Neg Hx     Social History   Socioeconomic History   Marital status: Married    Spouse name: Not on file   Number of children: Not on file   Years of education: Not on file   Highest education level: Not on file  Occupational History   Not on file  Tobacco Use   Smoking status: Never   Smokeless tobacco: Never  Vaping Use   Vaping status: Never Used  Substance and Sexual Activity   Alcohol use: Yes    Alcohol/week: 1.0 standard drink of alcohol    Types: 1 Shots of liquor per week    Comment: occasional   Drug use: No   Sexual activity: Yes  Other Topics Concern   Not on file  Social History Narrative   Not on file   Social Determinants of Health   Financial Resource Strain: Not on file  Food Insecurity: Not on file  Transportation Needs: Not on file  Physical  Activity: Not on file  Stress: Not on file  Social Connections: Not on file  Intimate Partner Violence: Not on file     Constitutional: Denies fever, malaise, fatigue, headache or abrupt weight changes.  HEENT: Denies eye pain, eye redness, ear pain, ringing in the ears, wax buildup, runny nose, nasal congestion, bloody nose, or sore throat. Respiratory: Denies difficulty breathing, shortness of breath, cough or sputum production.   Cardiovascular: Denies chest pain, chest tightness, palpitations or swelling in the hands or feet.  Gastrointestinal: Denies abdominal pain, bloating, constipation, diarrhea or blood in the stool.  GU: Denies urgency, frequency, pain with urination, burning sensation, blood in urine, odor or discharge. Musculoskeletal: Patient reports right elbow pain.  Chronic left shoulder, back pain.  Denies decrease in range of motion, difficulty with gait, or joint swelling.  Skin: Patient reports dry skin on face and arms.  Denies redness, rashes, lesions or ulcercations.  Neurological: Denies dizziness, difficulty with memory, difficulty with speech or problems with balance and coordination.  Psych: Patient has a history of intermittent anxiety.  Denies anxiety, depression, SI/HI.  No other specific complaints in a complete review of systems (except as listed in HPI above).  Objective:  Physical Exam BP 112/72 (BP Location: Left Arm, Patient Position: Sitting, Cuff Size: Large)   Pulse 81   Temp (!) 95.9 F (35.5 C) (Temporal)   Ht 5' 11.5" (1.816 m)   Wt 246 lb (111.6 kg)   SpO2 99%   BMI 33.83 kg/m   Wt Readings from Last 3 Encounters:  09/02/22 246 lb (111.6 kg)  08/27/22 241 lb (109.3 kg)  07/18/22 244 lb (110.7 kg)    General: Appears his stated age, obese, in NAD. Skin: Warm, dry and intact.  Eczema noted of the face and bilateral upper extremities. HEENT: Head: normal shape and size; Eyes: sclera white, no icterus, conjunctiva pink, PERRLA and EOMs  intact;  Neck:  Neck supple, trachea midline. No masses, lumps or thyromegaly present.  Cardiovascular: Normal rate and rhythm. S1,S2 noted.  No murmur, rubs or gallops noted. No JVD or BLE edema.  Pulmonary/Chest: Normal effort and positive vesicular breath sounds. No respiratory distress. No wheezes, rales or ronchi noted.  Abdomen: Soft and nontender. Normal bowel sounds.  Musculoskeletal: Normal flexion, extension and rotation of the right elbow.  Pop noted with full extension of the right elbow.  No pain with palpation of the right elbow.  Normal internal and external rotation of the left shoulder.  Pain with palpation over the left anterior proximal biceps tendon.  Negative drop can test on the left.  No bony tenderness noted over the lumbar spine.  Strength 5/5 BUE/BLE. No difficulty with gait.  Neurological: Alert and oriented. Cranial nerves II-XII grossly intact. Coordination normal.  Psychiatric: Mood and affect normal. Behavior is normal. Judgment and thought content normal.     BMET    Component Value Date/Time   NA 139 07/18/2022 1117   K 3.9 07/18/2022 1117   CL 105 07/18/2022 1117   CO2 28 07/18/2022 1117   GLUCOSE 92 07/18/2022 1117   BUN 7 07/18/2022 1117   CREATININE 0.99 07/18/2022 1117   CALCIUM 8.8 07/18/2022 1117    Lipid Panel     Component Value Date/Time   CHOL 104 07/18/2022 1117   TRIG 48 07/18/2022 1117   HDL 38 (L) 07/18/2022 1117   CHOLHDL 2.7 07/18/2022 1117   VLDL 16.4 09/12/2020 1534   LDLCALC 52 07/18/2022 1117    CBC    Component Value Date/Time   WBC 5.3 07/18/2022 1117   RBC 4.51 07/18/2022 1117   HGB 12.5 (L) 07/18/2022 1117   HCT 36.2 (L) 07/18/2022 1117   PLT 114 (L) 07/18/2022 1117   MCV 80.3 07/18/2022 1117   MCH 27.7 07/18/2022 1117   MCHC 34.5 07/18/2022 1117   RDW 17.1 (H) 07/18/2022 1117    Hgb A1C Lab Results  Component Value Date   HGBA1C 4.5 07/18/2022            Assessment & Plan:   Preventative health  maintenance:  Encouraged him to get a flu shot in the fall Tetanus UTD Encouraged him to get his COVID booster Encouraged him to consume a balanced diet and exercise regimen Advised him to see an eye doctor and dentist annually We will check CBC, c-Met, lipid, A1c today  Encounter for form completion with patient for chronic back pain, chronic left shoulder pain:  FMLA forms will be completed and faxed back per her request  Acute left elbow pain:  Encouraged him to avoid repetitive motions He declines x-ray at this time Elbow exercises given  RTC in 6 months, follow-up chronic conditions Nicki Reaper, NP

## 2023-02-25 LAB — COMPLETE METABOLIC PANEL WITH GFR
AG Ratio: 1.4 (calc) (ref 1.0–2.5)
ALT: 17 U/L (ref 9–46)
AST: 16 U/L (ref 10–40)
Albumin: 4.2 g/dL (ref 3.6–5.1)
Alkaline phosphatase (APISO): 71 U/L (ref 36–130)
BUN: 10 mg/dL (ref 7–25)
CO2: 26 mmol/L (ref 20–32)
Calcium: 9 mg/dL (ref 8.6–10.3)
Chloride: 107 mmol/L (ref 98–110)
Creat: 1.04 mg/dL (ref 0.60–1.29)
Globulin: 3.1 g/dL (calc) (ref 1.9–3.7)
Glucose, Bld: 89 mg/dL (ref 65–99)
Potassium: 3.9 mmol/L (ref 3.5–5.3)
Sodium: 142 mmol/L (ref 135–146)
Total Bilirubin: 1.2 mg/dL (ref 0.2–1.2)
Total Protein: 7.3 g/dL (ref 6.1–8.1)
eGFR: 93 mL/min/{1.73_m2} (ref >=60)

## 2023-02-25 LAB — HEMOGLOBIN A1C
Hgb A1c MFr Bld: 4.5 % of total Hgb (ref <5.7)
Mean Plasma Glucose: 82 mg/dL
eAG (mmol/L): 4.6 mmol/L

## 2023-02-25 LAB — LIPID PANEL
Cholesterol: 114 mg/dL (ref <200)
HDL: 40 mg/dL (ref >=40)
LDL Cholesterol (Calc): 54 mg/dL (calc)
Non-HDL Cholesterol (Calc): 74 mg/dL (calc) (ref <130)
Total CHOL/HDL Ratio: 2.9 (calc) (ref <5.0)
Triglycerides: 112 mg/dL (ref <150)

## 2023-02-25 LAB — CBC
HCT: 35.5 % — ABNORMAL LOW (ref 38.5–50.0)
Hemoglobin: 11.9 g/dL — ABNORMAL LOW (ref 13.2–17.1)
MPV: 10.9 fL (ref 7.5–12.5)
Platelets: 123 10*3/uL — ABNORMAL LOW (ref 140–400)
RBC: 4.36 10*6/uL (ref 4.20–5.80)
RDW: 17.2 % — ABNORMAL HIGH (ref 11.0–15.0)

## 2023-02-28 ENCOUNTER — Telehealth: Payer: Self-pay | Admitting: Internal Medicine

## 2023-02-28 NOTE — Telephone Encounter (Signed)
Pt reports that his wife with come by and pick up FMLA paper work. [name] Spouse Emergency Contact [phone]

## 2023-05-19 ENCOUNTER — Encounter: Payer: Self-pay | Admitting: Internal Medicine

## 2023-05-19 ENCOUNTER — Telehealth (INDEPENDENT_AMBULATORY_CARE_PROVIDER_SITE_OTHER): Payer: 59 | Admitting: Internal Medicine

## 2023-05-19 DIAGNOSIS — M79673 Pain in unspecified foot: Secondary | ICD-10-CM

## 2023-05-19 NOTE — Progress Notes (Signed)
Virtual Visit via Video Note  I connected with Patrick Hahn on 05/19/23 at 11:20 AM EDT by a video enabled telemedicine application and verified that I am speaking with the correct person using two identifiers.  Location: Patient: Home Provider: Office  Persons participating in this video call: Nicki Reaper, NP and Patrick Hahn   I discussed the limitations of evaluation and management by telemedicine and the availability of in person appointments. The patient expressed understanding and agreed to proceed.  History of Present Illness:  Patient reports foot pain.  This started 5 days ago after playing basketball.  He describes the pain as sharp and stabbing.  The pain was worse with ambulation.  He denied any swelling or bruising.  He had taken ibuprofen and rested with complete resolution of symptoms.  He denies any specific injury to the area.    No past medical history on file.  Current Outpatient Medications  Medication Sig Dispense Refill   ALPRAZolam (XANAX) 0.5 MG tablet Take 1 tablet (0.5 mg total) by mouth daily as needed for anxiety. 10 tablet 0   clobetasol ointment (TEMOVATE) 0.05 % Apply 1 Application topically 2 (two) times daily. 100 g 1   No current facility-administered medications for this visit.    No Known Allergies  Family History  Problem Relation Age of Onset   Cancer Maternal Uncle        throat   Prostate cancer Father    Diabetes Neg Hx    Early death Neg Hx    Heart disease Neg Hx    Stroke Neg Hx     Social History   Socioeconomic History   Marital status: Married    Spouse name: Not on file   Number of children: Not on file   Years of education: Not on file   Highest education level: Associate degree: occupational, Scientist, product/process development, or vocational program  Occupational History   Not on file  Tobacco Use   Smoking status: Never   Smokeless tobacco: Never  Vaping Use   Vaping status: Never Used  Substance and Sexual Activity   Alcohol use:  Yes    Alcohol/week: 1.0 standard drink of alcohol    Types: 1 Shots of liquor per week    Comment: occasional   Drug use: No   Sexual activity: Yes  Other Topics Concern   Not on file  Social History Narrative   Not on file   Social Determinants of Health   Financial Resource Strain: Low Risk  (02/24/2023)   Overall Financial Resource Strain (CARDIA)    Difficulty of Paying Living Expenses: Not very hard  Food Insecurity: No Food Insecurity (02/24/2023)   Hunger Vital Sign    Worried About Running Out of Food in the Last Year: Never true    Ran Out of Food in the Last Year: Never true  Transportation Needs: No Transportation Needs (02/24/2023)   PRAPARE - Administrator, Civil Service (Medical): No    Lack of Transportation (Non-Medical): No  Physical Activity: Unknown (02/24/2023)   Exercise Vital Sign    Days of Exercise per Week: 0 days    Minutes of Exercise per Session: Not on file  Stress: Stress Concern Present (02/24/2023)   [employer] of Occupational Health - Occupational Stress Questionnaire    Feeling of Stress : To some extent  Social Connections: Socially Isolated (02/24/2023)   Social Connection and Isolation Panel [NHANES]    Frequency of Communication with Friends and Family: Twice  a week    Frequency of Social Gatherings with Friends and Family: Never    Attends Religious Services: Never    Database administrator or Organizations: No    Attends Engineer, structural: Not on file    Marital Status: Married  Catering manager Violence: Not on file     Constitutional: Denies fever, malaise, fatigue, headache or abrupt weight changes.  Respiratory: Denies difficulty breathing, shortness of breath, cough or sputum production.   Cardiovascular: Denies chest pain, chest tightness, palpitations or swelling in the hands or feet.  Musculoskeletal: Patient reports foot pain, chronic low back and left shoulder pain.  Denies decrease in range of  motion, difficulty with gait, or joint swelling.  Skin: Denies redness, rashes, lesions or ulcercations.  Neurological: Denies numbness, tingling, weakness or problems with balance and coordination.    No other specific complaints in a complete review of systems (except as listed in HPI above).  Observations/Objective:  Wt Readings from Last 3 Encounters:  02/24/23 246 lb (111.6 kg)  09/02/22 246 lb (111.6 kg)  08/27/22 241 lb (109.3 kg)    General: Appears his stated age, obese, in NAD. Pulmonary/Chest: Normal effort. No respiratory distress.  Musculoskeletal: Normal flexion, extension and rotation of bilateral feet.  No joint swelling noted.  No difficulty with gait. Neurological: Alert and oriented.   BMET    Component Value Date/Time   NA 142 02/24/2023 0826   K 3.9 02/24/2023 0826   CL 107 02/24/2023 0826   CO2 26 02/24/2023 0826   GLUCOSE 89 02/24/2023 0826   BUN 10 02/24/2023 0826   CREATININE 1.04 02/24/2023 0826   CALCIUM 9.0 02/24/2023 0826    Lipid Panel     Component Value Date/Time   CHOL 114 02/24/2023 0826   TRIG 112 02/24/2023 0826   HDL 40 02/24/2023 0826   CHOLHDL 2.9 02/24/2023 0826   VLDL 16.4 09/12/2020 1534   LDLCALC 54 02/24/2023 0826    CBC    Component Value Date/Time   WBC 5.2 02/24/2023 0826   RBC 4.36 02/24/2023 0826   HGB 11.9 (L) 02/24/2023 0826   HCT 35.5 (L) 02/24/2023 0826   PLT 123 (L) 02/24/2023 0826   MCV 81.4 02/24/2023 0826   MCH 27.3 02/24/2023 0826   MCHC 33.5 02/24/2023 0826   RDW 17.2 (H) 02/24/2023 0826    Hgb A1C Lab Results  Component Value Date   HGBA1C 4.5 02/24/2023        Assessment and Plan:  Foot pain:  Symptoms have resolved at this time He is requesting a work note due to absence from work during that time  RTC in 3 months for follow-up of chronic conditions  Follow Up Instructions:    I discussed the assessment and treatment plan with the patient. The patient was provided an  opportunity to ask questions and all were answered. The patient agreed with the plan and demonstrated an understanding of the instructions.   The patient was advised to call back or seek an in-person evaluation if the symptoms worsen or if the condition fails to improve as anticipated.    Nicki Reaper, NP

## 2023-08-27 ENCOUNTER — Other Ambulatory Visit: Payer: Self-pay | Admitting: Internal Medicine

## 2023-08-27 ENCOUNTER — Ambulatory Visit (INDEPENDENT_AMBULATORY_CARE_PROVIDER_SITE_OTHER): Payer: Self-pay | Admitting: Internal Medicine

## 2023-08-27 ENCOUNTER — Encounter: Payer: Self-pay | Admitting: Internal Medicine

## 2023-08-27 VITALS — BP 118/74 | Ht 71.5 in | Wt 245.2 lb

## 2023-08-27 DIAGNOSIS — L409 Psoriasis, unspecified: Secondary | ICD-10-CM

## 2023-08-27 DIAGNOSIS — E6609 Other obesity due to excess calories: Secondary | ICD-10-CM

## 2023-08-27 DIAGNOSIS — R739 Hyperglycemia, unspecified: Secondary | ICD-10-CM

## 2023-08-27 DIAGNOSIS — Z6833 Body mass index (BMI) 33.0-33.9, adult: Secondary | ICD-10-CM

## 2023-08-27 DIAGNOSIS — H029 Unspecified disorder of eyelid: Secondary | ICD-10-CM

## 2023-08-27 DIAGNOSIS — Z136 Encounter for screening for cardiovascular disorders: Secondary | ICD-10-CM

## 2023-08-27 DIAGNOSIS — G8929 Other chronic pain: Secondary | ICD-10-CM

## 2023-08-27 DIAGNOSIS — R21 Rash and other nonspecific skin eruption: Secondary | ICD-10-CM

## 2023-08-27 DIAGNOSIS — D508 Other iron deficiency anemias: Secondary | ICD-10-CM

## 2023-08-27 DIAGNOSIS — F40243 Fear of flying: Secondary | ICD-10-CM

## 2023-08-27 DIAGNOSIS — E66811 Obesity, class 1: Secondary | ICD-10-CM

## 2023-08-27 DIAGNOSIS — D696 Thrombocytopenia, unspecified: Secondary | ICD-10-CM

## 2023-08-27 DIAGNOSIS — M545 Low back pain, unspecified: Secondary | ICD-10-CM

## 2023-08-27 MED ORDER — METRONIDAZOLE 1 % EX GEL: Freq: Every day | CUTANEOUS | 0 refills | Status: DC

## 2023-08-27 NOTE — Assessment & Plan Note (Signed)
 CBC today.

## 2023-08-27 NOTE — Assessment & Plan Note (Signed)
Continue clobetasol as needed

## 2023-08-27 NOTE — Patient Instructions (Signed)
 Rosacea Rosacea is a long-term (chronic) condition that affects the skin of the face, including the cheeks, nose, forehead, and chin. This condition can also affect the eyes. Rosacea causes blood vessels near the surface of the skin to enlarge, which results in redness. What are the causes? The cause of this condition is not known. Certain triggers can make rosacea worse, including: Exercise. Sunlight. Very hot or cold temperatures. Hot or spicy foods and drinks. Drinking alcohol. Stress. Taking blood pressure medicine. Long-term use of topical steroids on the face. What increases the risk? You are more likely to develop this condition if you: Are older than 42 years of age. Are a woman. Have light-colored skin (light complexion). Have a family history of rosacea. What are the signs or symptoms? Symptoms of this condition include: Redness of the face. Red bumps or pimples on the face. A red, enlarged nose. Blushing easily. Red lines on the skin. Eye problems such as: Irritated, burning, or itchy feeling in the eyes. Swollen eyelids. Drainage from the eyes. Feeling like there is something in your eye. How is this diagnosed? This condition is diagnosed with a medical history and physical exam. How is this treated? There is no cure for this condition, but treatment can help to control your symptoms. Your health care provider may recommend that you see a skin specialist (dermatologist). Treatment may include: Medicines that are applied to the skin or taken by mouth (orally). This can include antibiotic medicines. Laser treatment to improve the appearance of the skin. Surgery. This is rare. Your health care provider will also recommend the best way to take care of your skin. Even after your skin improves, you will likely need to continue treatment to prevent your rosacea from coming back. Follow these instructions at home: Skin care Take care of your skin as told by your health  care provider. You may be told to do these things: Wash your skin gently two or more times each day. Use mild soap. Use a sunscreen or sunblock with SPF 30 or greater. Use gentle cosmetics that are meant for sensitive skin. Shave with an electric shaver instead of a blade. Lifestyle Try to keep track of what foods trigger this condition. Avoid any triggers. These may include: Spicy foods. Seafood. Cheese. Hot liquids. Nuts. Chocolate. Iodized salt. Do not drink alcohol. Avoid extremely cold or hot temperatures. Try to reduce your stress. If you need help, talk with your health care provider. When you exercise, do these things to stay cool: Limit sun exposure to your face. Use a fan. Do shorter and more frequent intervals of exercise. General instructions Take and apply over-the-counter and prescription medicines only as told by your health care provider. If you were prescribed antibiotics, apply it or take them as told by your health care provider. Do not stop using the antibiotic even if your condition improves. If your eyelids are affected, apply warm compresses to them. Do this as told by your health care provider. Keep all follow-up visits. Contact a health care provider if: Your symptoms get worse. Your symptoms do not improve after 2 months of treatment. You have new symptoms. You have any changes in vision or you have problems with your eyes, such as redness or itching. You feel depressed. You lose your appetite. You have trouble concentrating. Summary Rosacea is a long-term (chronic) condition that affects the skin of the face, including the cheeks, nose, forehead, and chin. Take care of your skin as told by your health care  provider. Take and apply over-the-counter and prescription medicines only as told by your health care provider. Contact a health care provider if your symptoms get worse or if you have any changes in vision or other problems with your eyes, such as  redness or itching. Keep all follow-up visits. This information is not intended to replace advice given to you by your health care provider. Make sure you discuss any questions you have with your health care provider. Document Revised: 09/19/2021 Document Reviewed: 09/19/2021 Elsevier Patient Education  2024 [employer].

## 2023-08-27 NOTE — Assessment & Plan Note (Signed)
Encourage regular stretching and core strengthening Okay to take ibuprofen Tylenol OTC as needed

## 2023-08-27 NOTE — Assessment & Plan Note (Signed)
 Encourage diet and exercise for weight loss

## 2023-08-27 NOTE — Telephone Encounter (Signed)
 Requested medication (s) are due for refill today: alternative requested  Requested medication (s) are on the active medication list: yes  Last refill:  08/27/23  Future visit scheduled: yes  Notes to clinic:  Pharmacy comment: Alternative Requested:THE PRESCRIBED MEDICATION IS NOT COVERED BY INSURANCE. PLEASE CONSIDER CHANGING TO ONE OF THE SUGGESTED COVERED ALTERNATIVES.      Requested Prescriptions  Pending Prescriptions Disp Refills   Azelaic Acid 15 % gel [Pharmacy Med Name: AZELAIC ACID 15% GEL]  0     Dermatology:  Acne preparations Passed - 08/27/2023  2:08 PM      Passed - Valid encounter within last 12 months    Recent Outpatient Visits           Today Iron deficiency anemia secondary to inadequate dietary iron intake   [facility] [place], Rankin Buzzard, NP   3 months ago Pain of foot, unspecified laterality   [place] [facility] [place], Rankin Buzzard, NP   6 months ago Encounter for general adult medical examination with abnormal findings   [facility] [place], Rankin Buzzard, NP   11 months ago Viral sore throat   [place] [facility] [place], Rankin Buzzard, NP   1 year ago Fever, unspecified fever cause   [place] [facility] [place], Rankin Buzzard, NP       Future Appointments             In 6 months Baity, Rankin Buzzard, NP  [facility], [facility]

## 2023-08-27 NOTE — Assessment & Plan Note (Signed)
CBC today Consider referral to hematology if platelets <80

## 2023-08-27 NOTE — Assessment & Plan Note (Signed)
Continue Xanax as needed 

## 2023-08-27 NOTE — Progress Notes (Signed)
 Subjective:    Patient ID: Patrick Hahn, male    DOB: [birth year], 42 y.o.   MRN: [id number]  HPI  Patient presents to clinic today for follow-up of chronic conditions.  Psoriasis: Managed with clobetasol  ointment.  He does not follow with dermatology.  Anxiety with flying: He takes xanax  only as needed when flying.  Iron deficiency anemia: His last H/H was 11.9/35.5, 02/2023.  He is not taking any oral iron at this time.  He does not follow with hematology.  Chronic back pain: He reports this flares intermittently.  He will take ibuprofen  or tylenol OTC as needed.  Lumbar x-ray from 10/2015 reviewed.  He does not follow with orthopedics.  Thrombocytosis: His last platelet count was 123, 02/2023.  He is not following with hematology at this time.  Pt reports a lesion of his left lower eyelid. He noticed this about 2 weeks ago. He though it was a stye, so he applied a warm compress to the area. He also tried to squeeze it but nothing came out of it.  He also reports rash to bilateral cheeks. He noticed this 8-9 months ago. He reports the rash burns at times. It has not seemed to spread. He has tried changing his facial moisturizer but this has not seemed to help.  Review of Systems     No past medical history on file.  Current Outpatient Medications  Medication Sig Dispense Refill   ALPRAZolam  (XANAX ) 0.5 MG tablet Take 1 tablet (0.5 mg total) by mouth daily as needed for anxiety. 10 tablet 0   clobetasol  ointment (TEMOVATE ) 0.05 % Apply 1 Application topically 2 (two) times daily. 100 g 1   No current facility-administered medications for this visit.    No Known Allergies  Family History  Problem Relation Age of Onset   Cancer Maternal Uncle        throat   Prostate cancer Father    Diabetes Neg Hx    Early death Neg Hx    Heart disease Neg Hx    Stroke Neg Hx     Social History   Socioeconomic History   Marital status: Married    Spouse name: Not on file   Number  of children: Not on file   Years of education: Not on file   Highest education level: Associate degree: occupational, Scientist, product/process development, or vocational program  Occupational History   Not on file  Tobacco Use   Smoking status: Never   Smokeless tobacco: Never  Vaping Use   Vaping status: Never Used  Substance and Sexual Activity   Alcohol use: Yes    Alcohol/week: 1.0 standard drink of alcohol    Types: 1 Shots of liquor per week    Comment: occasional   Drug use: No   Sexual activity: Yes  Other Topics Concern   Not on file  Social History Narrative   Not on file   Social Drivers of Health   Financial Resource Strain: Low Risk  (02/24/2023)   Overall Financial Resource Strain (CARDIA)    Difficulty of Paying Living Expenses: Not very hard  Food Insecurity: No Food Insecurity (02/24/2023)   Hunger Vital Sign    Worried About Running Out of Food in the Last Year: Never true    Ran Out of Food in the Last Year: Never true  Transportation Needs: No Transportation Needs (02/24/2023)   PRAPARE - Administrator, Civil Service (Medical): No    Lack of Transportation (Non-Medical): No  Physical Activity: Unknown (02/24/2023)   Exercise Vital Sign    Days of Exercise per Week: 0 days    Minutes of Exercise per Session: Not on file  Stress: Stress Concern Present (02/24/2023)   [employer] of Occupational Health - Occupational Stress Questionnaire    Feeling of Stress : To some extent  Social Connections: Socially Isolated (02/24/2023)   Social Connection and Isolation Panel [NHANES]    Frequency of Communication with Friends and Family: Twice a week    Frequency of Social Gatherings with Friends and Family: Never    Attends Religious Services: Never    Database administrator or Organizations: No    Attends Engineer, structural: Not on file    Marital Status: Married  Catering manager Violence: Not on file     Constitutional: Denies fever, malaise, fatigue,  headache or abrupt weight changes.  HEENT: Pt reports skin lesion of left lower lid. Denies eye pain, eye redness, ear pain, ringing in the ears, wax buildup, runny nose, nasal congestion, bloody nose, or sore throat. Respiratory: Denies difficulty breathing, shortness of breath, cough or sputum production.   Cardiovascular: Denies chest pain, chest tightness, palpitations or swelling in the hands or feet.  Gastrointestinal: Denies abdominal pain, bloating, constipation, diarrhea or blood in the stool.  GU: Denies urgency, frequency, pain with urination, burning sensation, blood in urine, odor or discharge. Musculoskeletal: Patient reports chronic low back pain.  Denies decrease in range of motion, difficulty with gait, or joint swelling.  Skin: Patient reports dry skin, rash of face.  Denies redness, rashes, lesions or ulcercations.  Neurological: Denies dizziness, difficulty with memory, difficulty with speech or problems with balance and coordination.  Psych: Patient reports intermittent anxiety.  Denies depression, SI/HI.  No other specific complaints in a complete review of systems (except as listed in HPI above).  Objective:   Physical Exam BP 118/74 (BP Location: Left Arm, Patient Position: Sitting, Cuff Size: Large)   Ht 5' 11.5" (1.816 m)   Wt 245 lb 3.2 oz (111.2 kg)   BMI 33.72 kg/m   Wt Readings from Last 3 Encounters:  02/24/23 246 lb (111.6 kg)  09/02/22 246 lb (111.6 kg)  08/27/22 241 lb (109.3 kg)    General: Appears his stated age, obese, in NAD. Skin: Warm, dry and intact.  Grouped maculpapular rash noted of bilateral cheeks. HEENT: Head: normal shape and size; Eyes: sclera white, no icterus, conjunctiva pink, PERRLA and EOMs intact, abnormal lesion noted of left lower lid;   Cardiovascular: Normal rate and rhythm. S1,S2 noted.  No murmur, rubs or gallops noted. No JVD or BLE edema.  Pulmonary/Chest: Normal effort and positive vesicular breath sounds. No respiratory  distress. No wheezes, rales or ronchi noted.  Musculoskeletal:  No difficulty with gait.  Neurological: Alert and oriented. Coordination normal.  Psychiatric: Mood and affect normal. Behavior is normal. Judgment and thought content normal.     BMET    Component Value Date/Time   NA 142 02/24/2023 0826   K 3.9 02/24/2023 0826   CL 107 02/24/2023 0826   CO2 26 02/24/2023 0826   GLUCOSE 89 02/24/2023 0826   BUN 10 02/24/2023 0826   CREATININE 1.04 02/24/2023 0826   CALCIUM 9.0 02/24/2023 0826    Lipid Panel     Component Value Date/Time   CHOL 114 02/24/2023 0826   TRIG 112 02/24/2023 0826   HDL 40 02/24/2023 0826   CHOLHDL 2.9 02/24/2023 0826   VLDL 16.4 09/12/2020  1534   LDLCALC 54 02/24/2023 0826    CBC    Component Value Date/Time   WBC 5.2 02/24/2023 0826   RBC 4.36 02/24/2023 0826   HGB 11.9 (L) 02/24/2023 0826   HCT 35.5 (L) 02/24/2023 0826   PLT 123 (L) 02/24/2023 0826   MCV 81.4 02/24/2023 0826   MCH 27.3 02/24/2023 0826   MCHC 33.5 02/24/2023 0826   RDW 17.2 (H) 02/24/2023 0826    Hgb A1C Lab Results  Component Value Date   HGBA1C 4.5 02/24/2023            Assessment & Plan:   Lesion of left lower eyelid:  Recommend he call his ophthalmologist and schedule an appointment ASAP for further evaluation  Rash of face:  Will check ANA, ESR, CRP, C3, C4, antiphospholipid antibodies, anti-double-stranded DNA For MetroGel  1% daily as needed Referral to dermatology for further evaluation and treatment   RTC in 6 months for annual exam Helayne Lo, NP

## 2023-08-28 ENCOUNTER — Other Ambulatory Visit: Payer: Self-pay | Admitting: Internal Medicine

## 2023-08-28 MED ORDER — ZOLPIDEM TARTRATE ER 12.5 MG PO TBCR
12.5000 mg | EXTENDED_RELEASE_TABLET | Freq: Every evening | ORAL | 0 refills | Status: DC | PRN
Start: 2023-08-28 — End: 2023-11-10

## 2023-09-01 ENCOUNTER — Encounter: Payer: Self-pay | Admitting: Internal Medicine

## 2023-09-01 DIAGNOSIS — R768 Other specified abnormal immunological findings in serum: Secondary | ICD-10-CM

## 2023-09-01 DIAGNOSIS — R21 Rash and other nonspecific skin eruption: Secondary | ICD-10-CM

## 2023-09-03 LAB — ANTIPHOSPHOLOPID AB PANEL
Anticardiolipin IgA: <2 [APL'U]/mL (ref <20.0)
Anticardiolipin IgG: <2 [GPL'U]/mL (ref <20.0)
Anticardiolipin IgM: <2 [MPL'U]/mL (ref <20.0)
Beta-2 Glyco 1 IgA: <2 U/mL (ref <20.0)
Beta-2 Glyco 1 IgM: <2 U/mL (ref <20.0)
Beta-2 Glyco I IgG: <2 U/mL (ref <20.0)
Phosphatidylserine/Prothrombin Ab (IgG): 39 U — ABNORMAL HIGH (ref <=30)
Phosphatidylserine/Prothrombin Ab (IgM): 19 U (ref <=30)

## 2023-09-03 LAB — ANTI-NUCLEAR AB-TITER (ANA TITER): ANA Titer 1: 1:320 {titer} — ABNORMAL HIGH

## 2023-09-03 LAB — COMPLETE METABOLIC PANEL WITHOUT GFR
AG Ratio: 1.2 (calc) (ref 1.0–2.5)
AST: 22 U/L (ref 10–40)
Alkaline phosphatase (APISO): 82 U/L (ref 36–130)
CO2: 29 mmol/L (ref 20–32)
Calcium: 8.8 mg/dL (ref 8.6–10.3)
Creat: 1.05 mg/dL (ref 0.60–1.29)
Globulin: 3.4 g/dL (ref 1.9–3.7)
Glucose, Bld: 91 mg/dL (ref 65–99)
Sodium: 142 mmol/L (ref 135–146)

## 2023-09-03 LAB — C3 AND C4
C3 Complement: 99 mg/dL (ref 82–185)
C4 Complement: 25 mg/dL (ref 15–53)

## 2023-09-03 LAB — HEMOGLOBIN A1C
Hgb A1c MFr Bld: 4.5 %{Hb} (ref <5.7)
Mean Plasma Glucose: 82 mg/dL
eAG (mmol/L): 4.6 mmol/L

## 2023-09-03 LAB — COMPLETE METABOLIC PANEL WITH GFR
ALT: 30 U/L (ref 9–46)
Albumin: 4 g/dL (ref 3.6–5.1)
BUN: 8 mg/dL (ref 7–25)
Chloride: 104 mmol/L (ref 98–110)
Potassium: 4.2 mmol/L (ref 3.5–5.3)
Total Bilirubin: 1.5 mg/dL — ABNORMAL HIGH (ref 0.2–1.2)
Total Protein: 7.4 g/dL (ref 6.1–8.1)
eGFR: 91 mL/min/{1.73_m2} (ref >=60)

## 2023-09-03 LAB — CBC
HCT: 38.7 % (ref 38.5–50.0)
Hemoglobin: 12.7 g/dL — ABNORMAL LOW (ref 13.2–17.1)
MCH: 26.6 pg — ABNORMAL LOW (ref 27.0–33.0)
MCHC: 32.8 g/dL (ref 32.0–36.0)
MCV: 81.1 fL (ref 80.0–100.0)
MPV: 11.3 fL (ref 7.5–12.5)
Platelets: 141 10*3/uL (ref 140–400)
RBC: 4.77 10*6/uL (ref 4.20–5.80)
RDW: 16.6 % — ABNORMAL HIGH (ref 11.0–15.0)
WBC: 5.3 10*3/uL (ref 3.8–10.8)

## 2023-09-03 LAB — LIPID PANEL
Cholesterol: 112 mg/dL (ref <200)
HDL: 37 mg/dL — ABNORMAL LOW (ref >=40)
LDL Cholesterol (Calc): 62 mg/dL
Non-HDL Cholesterol (Calc): 75 mg/dL (ref <130)
Total CHOL/HDL Ratio: 3 (calc) (ref <5.0)
Triglycerides: 58 mg/dL (ref <150)

## 2023-09-03 LAB — SEDIMENTATION RATE: Sed Rate: 2 mm/h (ref 0–15)

## 2023-09-03 LAB — ANTI-DNA ANTIBODY, DOUBLE-STRANDED: ds DNA Ab: 5 [IU]/mL — ABNORMAL HIGH

## 2023-09-03 LAB — C-REACTIVE PROTEIN: CRP: 3.9 mg/L (ref <8.0)

## 2023-09-03 LAB — ANA: Anti Nuclear Antibody (ANA): POSITIVE — AB

## 2023-10-22 ENCOUNTER — Ambulatory Visit: Admitting: Internal Medicine

## 2023-10-22 NOTE — Progress Notes (Deleted)
 Subjective:    Patient ID: Patrick Hahn, male    DOB: 04-29-1981, 43 y.o.   MRN: [id number]  HPI    Review of Systems     No past medical history on file.  Current Outpatient Medications  Medication Sig Dispense Refill   ALPRAZolam (XANAX) 0.5 MG tablet Take 1 tablet (0.5 mg total) by mouth daily as needed for anxiety. (Patient not taking: Reported on 08/27/2023) 10 tablet 0   Azelaic Acid 15 % gel Apply 1 Application topically 2 (two) times daily. 50 g 0   clobetasol ointment (TEMOVATE) 0.05 % Apply 1 Application topically 2 (two) times daily. (Patient not taking: Reported on 08/27/2023) 100 g 1   zolpidem (AMBIEN CR) 12.5 MG CR tablet Take 1 tablet (12.5 mg total) by mouth at bedtime as needed for sleep. 30 tablet 0   No current facility-administered medications for this visit.    No Known Allergies  Family History  Problem Relation Age of Onset   Cancer Maternal Uncle        throat   Prostate cancer Father    Diabetes Neg Hx    Early death Neg Hx    Heart disease Neg Hx    Stroke Neg Hx     Social History   Socioeconomic History   Marital status: Married    Spouse name: Not on file   Number of children: Not on file   Years of education: Not on file   Highest education level: Associate degree: occupational, Scientist, product/process development, or vocational program  Occupational History   Not on file  Tobacco Use   Smoking status: Never   Smokeless tobacco: Never  Vaping Use   Vaping status: Never Used  Substance and Sexual Activity   Alcohol use: Yes    Alcohol/week: 1.0 standard drink of alcohol    Types: 1 Shots of liquor per week    Comment: occasional   Drug use: No   Sexual activity: Yes  Other Topics Concern   Not on file  Social History Narrative   Not on file   Social Drivers of Health   Financial Resource Strain: Low Risk  (02/24/2023)   Overall Financial Resource Strain (CARDIA)    Difficulty of Paying Living Expenses: Not very hard  Food Insecurity: No Food  Insecurity (02/24/2023)   Hunger Vital Sign    Worried About Running Out of Food in the Last Year: Never true    Ran Out of Food in the Last Year: Never true  Transportation Needs: No Transportation Needs (02/24/2023)   PRAPARE - Administrator, Civil Service (Medical): No    Lack of Transportation (Non-Medical): No  Physical Activity: Unknown (02/24/2023)   Exercise Vital Sign    Days of Exercise per Week: 0 days    Minutes of Exercise per Session: Not on file  Stress: Stress Concern Present (02/24/2023)   [employer] of Occupational Health - Occupational Stress Questionnaire    Feeling of Stress : To some extent  Social Connections: Socially Isolated (02/24/2023)   Social Connection and Isolation Panel [NHANES]    Frequency of Communication with Friends and Family: Twice a week    Frequency of Social Gatherings with Friends and Family: Never    Attends Religious Services: Never    Database administrator or Organizations: No    Attends Engineer, structural: Not on file    Marital Status: Married  Catering manager Violence: Not on file  Constitutional: Denies fever, malaise, fatigue, headache or abrupt weight changes.  HEENT:  Denies eye pain, eye redness, ear pain, ringing in the ears, wax buildup, runny nose, nasal congestion, bloody nose, or sore throat. Respiratory: Denies difficulty breathing, shortness of breath, cough or sputum production.   Cardiovascular: Denies chest pain, chest tightness, palpitations or swelling in the hands or feet.  Gastrointestinal: Denies abdominal pain, bloating, constipation, diarrhea or blood in the stool.  GU: Denies urgency, frequency, pain with urination, burning sensation, blood in urine, odor or discharge. Musculoskeletal: Patient reports chronic low back pain.  Denies decrease in range of motion, difficulty with gait, or joint swelling.  Skin: Denies redness, rashes, lesions or ulcercations.  Neurological: Denies  dizziness, difficulty with memory, difficulty with speech or problems with balance and coordination.  Psych: Patient reports intermittent anxiety.  Denies depression, SI/HI.  No other specific complaints in a complete review of systems (except as listed in HPI above).  Objective:   Physical Exam There were no vitals taken for this visit.  Wt Readings from Last 3 Encounters:  08/27/23 245 lb 3.2 oz (111.2 kg)  02/24/23 246 lb (111.6 kg)  09/02/22 246 lb (111.6 kg)    General: Appears his stated age, obese, in NAD. Skin: Warm, dry and intact.  Grouped maculpapular rash noted of bilateral cheeks. HEENT: Head: normal shape and size; Eyes: sclera white, no icterus, conjunctiva pink, PERRLA and EOMs intact, abnormal lesion noted of left lower lid; Cardiovascular: Normal rate and rhythm. S1,S2 noted.  No murmur, rubs or gallops noted. No JVD or BLE edema.  Pulmonary/Chest: Normal effort and positive vesicular breath sounds. No respiratory distress. No wheezes, rales or ronchi noted.  Musculoskeletal:  No difficulty with gait.  Neurological: Alert and oriented. Coordination normal.  Psychiatric: Mood and affect normal. Behavior is normal. Judgment and thought content normal.     BMET    Component Value Date/Time   NA 142 08/27/2023 0828   K 4.2 08/27/2023 0828   CL 104 08/27/2023 0828   CO2 29 08/27/2023 0828   GLUCOSE 91 08/27/2023 0828   BUN 8 08/27/2023 0828   CREATININE 1.05 08/27/2023 0828   CALCIUM 8.8 08/27/2023 0828    Lipid Panel     Component Value Date/Time   CHOL 112 08/27/2023 0828   TRIG 58 08/27/2023 0828   HDL 37 (L) 08/27/2023 0828   CHOLHDL 3.0 08/27/2023 0828   VLDL 16.4 09/12/2020 1534   LDLCALC 62 08/27/2023 0828    CBC    Component Value Date/Time   WBC 5.3 08/27/2023 0828   RBC 4.77 08/27/2023 0828   HGB 12.7 (L) 08/27/2023 0828   HCT 38.7 08/27/2023 0828   PLT 141 08/27/2023 0828   MCV 81.1 08/27/2023 0828   MCH 26.6 (L) 08/27/2023 0828    MCHC 32.8 08/27/2023 0828   RDW 16.6 (H) 08/27/2023 0828    Hgb A1C Lab Results  Component Value Date   HGBA1C 4.5 08/27/2023            Assessment & Plan:      RTC in 4 months for your annual exam Nicki Reaper, NP

## 2023-11-06 ENCOUNTER — Other Ambulatory Visit: Payer: Self-pay | Admitting: Internal Medicine

## 2023-11-07 NOTE — Telephone Encounter (Signed)
 Requested medications are due for refill today.  yes  Requested medications are on the active medications list.  yes  Last refill. 08/28/2023 #30 0 rf  Future visit scheduled.   yes  Notes to clinic.  Refill not delegated.    Requested Prescriptions  Pending Prescriptions Disp Refills   zolpidem (AMBIEN CR) 12.5 MG CR tablet [Pharmacy Med Name: ZOLPIDEM TART ER 12.5 MG TAB] 15 tablet 1    Sig: TAKE 1 TABLET BY MOUTH AT BEDTIME AS NEEDED FOR SLEEP.     Not Delegated - Psychiatry:  Anxiolytics/Hypnotics Failed - 11/07/2023  5:23 PM      Failed - This refill cannot be delegated      Failed - Urine Drug Screen completed in last 360 days      Failed - Valid encounter within last 6 months    Recent Outpatient Visits   None     Future Appointments             In 3 months Baity, Salvadore [place], NP [facility], [facility]

## 2023-11-17 ENCOUNTER — Encounter: Payer: Self-pay | Admitting: Internal Medicine

## 2023-11-17 ENCOUNTER — Other Ambulatory Visit: Payer: Self-pay | Admitting: Internal Medicine

## 2023-11-17 ENCOUNTER — Telehealth: Payer: Self-pay

## 2023-11-17 ENCOUNTER — Ambulatory Visit (INDEPENDENT_AMBULATORY_CARE_PROVIDER_SITE_OTHER): Admitting: Internal Medicine

## 2023-11-17 VITALS — BP 112/70 | Ht 71.5 in | Wt 246.4 lb

## 2023-11-17 DIAGNOSIS — R194 Change in bowel habit: Secondary | ICD-10-CM | POA: Diagnosis not present

## 2023-11-17 DIAGNOSIS — R2232 Localized swelling, mass and lump, left upper limb: Secondary | ICD-10-CM | POA: Diagnosis not present

## 2023-11-17 DIAGNOSIS — Z1231 Encounter for screening mammogram for malignant neoplasm of breast: Secondary | ICD-10-CM

## 2023-11-17 DIAGNOSIS — R109 Unspecified abdominal pain: Secondary | ICD-10-CM

## 2023-11-17 MED ORDER — DICYCLOMINE HCL 10 MG PO CAPS: 10.0000 mg | ORAL_CAPSULE | Freq: Three times a day (TID) | ORAL | 0 refills | Status: DC

## 2023-11-17 NOTE — Patient Instructions (Signed)
 High-Fiber Eating Plan Fiber, also called dietary fiber, is found in foods such as fruits, vegetables, whole grains, and beans. A high-fiber diet can be good for your health. Your health care provider may recommend a high-fiber diet to help: Prevent trouble pooping (constipation). Lower your cholesterol. Treat the following conditions: Hemorrhoids. This is inflammation of veins in the anus. Inflammation of specific areas of the digestive tract. Irritable bowel syndrome (IBS). This is a problem of the large intestine, also called the colon, that sometimes causes belly pain and bloating. Prevent overeating as part of a weight-loss plan. Lower the risk of heart disease, type 2 diabetes, and certain cancers. What are tips for following this plan? Reading food labels  Check the nutrition facts label on foods for the amount of dietary fiber. Choose foods that have 4 grams of fiber or more per serving. The recommended goals for how much fiber you should eat each day include: Males 40 years old or younger: 30-34 g. Males over 37 years old: 28-34 g. Females 64 years old or younger: 25-28 g. Females over 69 years old: 22-25 g. Your daily fiber goal is _____________ g. Shopping Choose whole fruits and vegetables instead of processed. For example, choose apples instead of apple juice or applesauce. Choose a variety of high-fiber foods such as avocados, lentils, oats, and pinto beans. Read the nutrition facts label on foods. Check for foods with added fiber. These foods often have high sugar and salt (sodium) amounts per serving. Cooking Use whole-grain flour for baking and cooking. Cook with brown rice instead of white rice. Make meals that have a lot of beans and vegetables in them, such as chili or vegetable-based soups. Meal planning Start the day with a breakfast that is high in fiber, such as a cereal that has 5 g of fiber or more per serving. Eat breads and cereals that are made with  whole-grain flour instead of refined flour or white flour. Eat brown rice, bulgur wheat, or millet instead of white rice. Use beans in place of meat in soups, salads, and pasta dishes. Be sure that half of the grains you eat each day are whole grains. General information You can get the recommended amount of dietary fiber by: Eating a variety of fruits, vegetables, grains, nuts, and beans. Taking a fiber supplement if you aren't able to eat enough fiber. It's better to get fiber through food than from a supplement. Slowly increase how much fiber you eat. If you increase the amount of fiber you eat too quickly, you may have bloating, cramping, or gas. Drink plenty of water to help you digest fiber. Choose high-fiber snacks, such as berries, raw vegetables, nuts, and popcorn. What foods should I eat? Fruits Berries. Pears. Apples. Oranges. Avocado. Prunes and raisins. Dried figs. Vegetables Sweet potatoes. Spinach. Kale. Artichokes. Cabbage. Broccoli. Cauliflower. Green peas. Carrots. Squash. Grains Whole-grain breads. Multigrain cereal. Oats and oatmeal. Brown rice. Barley. Bulgur wheat. Millet. Quinoa. Bran muffins. Popcorn. Rye wafer crackers. Meats and other proteins Navy beans, kidney beans, and pinto beans. Soybeans. Split peas. Lentils. Nuts and seeds. Dairy Fiber-fortified yogurt. Fortified means that fiber has been added to the product. Beverages Fiber-fortified soy milk. Fiber-fortified orange juice. Other foods Fiber bars. The items listed above may not be all the foods and drinks you can have. Talk to a dietitian to learn more. What foods should I avoid? Fruits Fruit juice. Cooked, strained fruit. Vegetables Fried potatoes. Canned vegetables. Well-cooked vegetables. Grains White bread. Pasta made with refined  flour. White rice. Meats and other proteins Fatty meat. Fried chicken or fried fish. Dairy Milk. Cream cheese. Sour cream. Fats and  oils Butters. Beverages Soft drinks. Other foods Cakes and pastries. The items listed above may not be all the foods and drinks you should avoid. Talk to a dietitian to learn more. This information is not intended to replace advice given to you by your health care provider. Make sure you discuss any questions you have with your health care provider. Document Revised: 10/21/2022 Document Reviewed: 10/21/2022 Elsevier Patient Education  2024 [employer].

## 2023-11-17 NOTE — Progress Notes (Signed)
 Subjective:    Patient ID: Patrick Hahn, male    DOB: 06-25-1981, 43 y.o.   MRN: [id number]  HPI  Discussed the use of AI scribe software for clinical note transcription with the patient, who gave verbal consent to proceed.   Patrick Hahn is a 43 year old male who presents with abdominal cramping and incomplete bowel movements.  He experiences abdominal cramping primarily in the right upper quadrant. The cramping is intermittent, occurring randomly without any clear triggers, and is not associated with eating. No nausea, vomiting, or heartburn.  He describes a sensation of incomplete bowel movements, feeling as though he is not fully emptying his bowels despite regular movements. He has not had any recent changes in diet or medications.  He noticed a palpable area under his left axilla about a week ago, which is slightly tender. He has not experienced any other symptoms related to this finding.       Review of Systems   No past medical history on file.  Current Outpatient Medications  Medication Sig Dispense Refill   ALPRAZolam (XANAX) 0.5 MG tablet Take 1 tablet (0.5 mg total) by mouth daily as needed for anxiety. (Patient not taking: Reported on 08/27/2023) 10 tablet 0   Azelaic Acid 15 % gel Apply 1 Application topically 2 (two) times daily. 50 g 0   clobetasol ointment (TEMOVATE) 0.05 % Apply 1 Application topically 2 (two) times daily. (Patient not taking: Reported on 08/27/2023) 100 g 1   zolpidem (AMBIEN CR) 12.5 MG CR tablet TAKE 1 TABLET BY MOUTH AT BEDTIME AS NEEDED FOR SLEEP. 15 tablet 1   No current facility-administered medications for this visit.    No Known Allergies  Family History  Problem Relation Age of Onset   Cancer Maternal Uncle        throat   Prostate cancer Father    Diabetes Neg Hx    Early death Neg Hx    Heart disease Neg Hx    Stroke Neg Hx     Social History   Socioeconomic History   Marital status: Married    Spouse name: Not on file    Number of children: Not on file   Years of education: Not on file   Highest education level: Associate degree: occupational, Scientist, product/process development, or vocational program  Occupational History   Not on file  Tobacco Use   Smoking status: Never   Smokeless tobacco: Never  Vaping Use   Vaping status: Never Used  Substance and Sexual Activity   Alcohol use: Yes    Alcohol/week: 1.0 standard drink of alcohol    Types: 1 Shots of liquor per week    Comment: occasional   Drug use: No   Sexual activity: Yes  Other Topics Concern   Not on file  Social History Narrative   Not on file   Social Drivers of Health   Financial Resource Strain: Low Risk  (02/24/2023)   Overall Financial Resource Strain (CARDIA)    Difficulty of Paying Living Expenses: Not very hard  Food Insecurity: No Food Insecurity (02/24/2023)   Hunger Vital Sign    Worried About Running Out of Food in the Last Year: Never true    Ran Out of Food in the Last Year: Never true  Transportation Needs: No Transportation Needs (02/24/2023)   PRAPARE - Administrator, Civil Service (Medical): No    Lack of Transportation (Non-Medical): No  Physical Activity: Unknown (02/24/2023)   Exercise Vital Sign  Days of Exercise per Week: 0 days    Minutes of Exercise per Session: Not on file  Stress: Stress Concern Present (02/24/2023)   [employer] of Occupational Health - Occupational Stress Questionnaire    Feeling of Stress : To some extent  Social Connections: Socially Isolated (02/24/2023)   Social Connection and Isolation Panel [NHANES]    Frequency of Communication with Friends and Family: Twice a week    Frequency of Social Gatherings with Friends and Family: Never    Attends Religious Services: Never    Database administrator or Organizations: No    Attends Engineer, structural: Not on file    Marital Status: Married  Catering manager Violence: Not on file     Constitutional: Denies fever, malaise,  fatigue, headache or abrupt weight changes.  Respiratory: Denies difficulty breathing, shortness of breath, cough or sputum production.   Cardiovascular: Denies chest pain, chest tightness, palpitations or swelling in the hands or feet.  Gastrointestinal: Pt reports abdominal cramping, incomplete bowel emptying. Denies bloating, constipation, diarrhea or blood in the stool.  GU: Denies urgency, frequency, pain with urination, burning sensation, blood in urine, odor or discharge. Musculoskeletal: Pt reports chronic back pain. Denies decrease in range of motion, difficulty with gait, or joint swelling.  Skin: Patient reports mass of left axilla.  Denies redness, rashes, or ulcercations.  Neurological: Denies dizziness, difficulty with memory, difficulty with speech or problems with balance and coordination.    No other specific complaints in a complete review of systems (except as listed in HPI above).      Objective:   Physical Exam BP 112/70 (BP Location: Left Arm, Patient Position: Sitting, Cuff Size: Large)   Ht 5' 11.5" (1.816 m)   Wt 246 lb 6.4 oz (111.8 kg)   BMI 33.89 kg/m   Wt Readings from Last 3 Encounters:  08/27/23 245 lb 3.2 oz (111.2 kg)  02/24/23 246 lb (111.6 kg)  09/02/22 246 lb (111.6 kg)    General: Appears his stated age, obese, in NAD. Skin: Warm, dry and intact.  Excessive fat buildup noted of the left axilla with 2.5 cm mass noted at 2:00. Cardiovascular: Normal rate and rhythm. S1,S2 noted.  No murmur, rubs or gallops noted. Pulmonary/Chest: Normal effort and positive vesicular breath sounds. No respiratory distress. No wheezes, rales or ronchi noted.  Abdomen: Soft and nontender.  Hypoactive bowel sounds. No distention or masses noted.  Musculoskeletal: No difficulty with gait.  Neurological: Alert and oriented. Coordination normal.    BMET    Component Value Date/Time   NA 142 08/27/2023 0828   K 4.2 08/27/2023 0828   CL 104 08/27/2023 0828   CO2 29  08/27/2023 0828   GLUCOSE 91 08/27/2023 0828   BUN 8 08/27/2023 0828   CREATININE 1.05 08/27/2023 0828   CALCIUM 8.8 08/27/2023 0828    Lipid Panel     Component Value Date/Time   CHOL 112 08/27/2023 0828   TRIG 58 08/27/2023 0828   HDL 37 (L) 08/27/2023 0828   CHOLHDL 3.0 08/27/2023 0828   VLDL 16.4 09/12/2020 1534   LDLCALC 62 08/27/2023 0828    CBC    Component Value Date/Time   WBC 5.3 08/27/2023 0828   RBC 4.77 08/27/2023 0828   HGB 12.7 (L) 08/27/2023 0828   HCT 38.7 08/27/2023 0828   PLT 141 08/27/2023 0828   MCV 81.1 08/27/2023 0828   MCH 26.6 (L) 08/27/2023 0828   MCHC 32.8 08/27/2023 0828  RDW 16.6 (H) 08/27/2023 0828    Hgb A1C Lab Results  Component Value Date   HGBA1C 4.5 08/27/2023            Assessment & Plan:   Assessment and Plan    Abdominal Cramping, Incomplete Bowel Emptying Intermittent right upper quadrant cramping. Differential includes IBS, stress, or dietary factors. - Prescribed Bentyl 10 mg TID PRN for cramping. - Recommended Miralax 17 grams every other day to aid bowel movements.  Left Axillary Mass Mass noticed one week ago with tenderness. Differential includes lymph node, asymmetrical fat buildup, or lipoma. - Ordered ultrasound of the left axilla.       RTC in 3 months for your annual exam Nicki Reaper, NP

## 2023-11-17 NOTE — Telephone Encounter (Signed)
 [facility] (Key: [facility]) Rx #: [id number] Need Help? Call us at [phone] Status New (Not sent to plan) Drug Dicyclomine HCl 10MG  capsules ePA cloud logo Form [employer] Electronic PA Form [phone] NCPDP) Original Claim Info 75 MUST USE DICYCLOMINE CAP (EXCEPT [id number]) OR MED PA ONLY [id number] DRUG REQUIRES PRIOR AUTHORIZATION   Denied on April 7 by [facility] NCPDP 2017 Your PA request has been denied. Additional information will be provided in the denial communication. (Message [phone])

## 2023-11-18 ENCOUNTER — Telehealth: Payer: Self-pay

## 2023-11-18 ENCOUNTER — Encounter: Payer: Self-pay | Admitting: Internal Medicine

## 2023-11-18 NOTE — Telephone Encounter (Signed)
 Dicyclomine was denied by insurance. Paperwork has been scanned in for the denial.

## 2023-11-19 ENCOUNTER — Other Ambulatory Visit: Payer: Self-pay | Admitting: Internal Medicine

## 2023-11-19 ENCOUNTER — Ambulatory Visit
Admission: RE | Admit: 2023-11-19 | Discharge: 2023-11-19 | Disposition: A | Discharge status: Home / Self Care | Source: Ambulatory Visit | Attending: Internal Medicine | Admitting: Internal Medicine

## 2023-11-19 DIAGNOSIS — R2232 Localized swelling, mass and lump, left upper limb: Secondary | ICD-10-CM

## 2023-11-19 DIAGNOSIS — Z1231 Encounter for screening mammogram for malignant neoplasm of breast: Secondary | ICD-10-CM

## 2023-11-19 DIAGNOSIS — R109 Unspecified abdominal pain: Secondary | ICD-10-CM

## 2023-11-19 DIAGNOSIS — R194 Change in bowel habit: Secondary | ICD-10-CM

## 2024-01-30 ENCOUNTER — Ambulatory Visit: Payer: Self-pay | Admitting: Internal Medicine

## 2024-01-30 ENCOUNTER — Ambulatory Visit (INDEPENDENT_AMBULATORY_CARE_PROVIDER_SITE_OTHER): Admitting: Internal Medicine

## 2024-01-30 ENCOUNTER — Other Ambulatory Visit (HOSPITAL_COMMUNITY)
Admission: RE | Admit: 2024-01-30 | Discharge: 2024-01-30 | Disposition: A | Discharge status: Home / Self Care | Source: Ambulatory Visit | Attending: Internal Medicine | Admitting: Internal Medicine

## 2024-01-30 ENCOUNTER — Telehealth: Payer: Self-pay

## 2024-01-30 ENCOUNTER — Encounter: Payer: Self-pay | Admitting: Internal Medicine

## 2024-01-30 VITALS — BP 120/74 | Ht 71.5 in | Wt 247.6 lb

## 2024-01-30 DIAGNOSIS — R21 Rash and other nonspecific skin eruption: Secondary | ICD-10-CM

## 2024-01-30 DIAGNOSIS — R760 Raised antibody titer: Secondary | ICD-10-CM | POA: Diagnosis not present

## 2024-01-30 DIAGNOSIS — R768 Other specified abnormal immunological findings in serum: Secondary | ICD-10-CM

## 2024-01-30 DIAGNOSIS — R300 Dysuria: Secondary | ICD-10-CM | POA: Diagnosis present

## 2024-01-30 LAB — POCT URINE DIPSTICK
Bilirubin, UA: NEGATIVE
Glucose, UA: NEGATIVE mg/dL
Ketones, POC UA: NEGATIVE mg/dL
Leukocytes, UA: NEGATIVE
Nitrite, UA: NEGATIVE
Spec Grav, UA: 1.025 (ref 1.010–1.025)
Urobilinogen, UA: 0.2 U/dL
pH, UA: 6 (ref 5.0–8.0)

## 2024-01-30 NOTE — Progress Notes (Signed)
 Subjective:    Patient ID: Patrick Hahn, male    DOB: 05-05-1981, 43 y.o.   MRN: [id number]  HPI  Discussed the use of AI scribe software for clinical note transcription with the patient, who gave verbal consent to proceed.  Patrick Hahn is a 43 year old male who presents with urinary symptoms and a worsening facial rash.  He has been experiencing urinary symptoms since last week, including a sensation of 'a little pinch' and increased frequency of urination. No burning sensation during urination, hematuria, lower back pain, fever, chills, nausea, vomiting, penile discharge, or testicular pain or swelling. He is concerned about the possibility of a sexually transmitted infection due to his partner's history of herpes, but denies any sores or lesions.  He reports he is in a monogamous relationship.  He reports a worsening facial rash, which has become more severe. The rash itches but does not burn. He recalls previous lab work and was supposed to follow up with rheumatology, but missed the appointment.  He had a positive ANA, elevated anti-double-stranded DNA as well as a positive antiphospholipid antibody.   Review of Systems   No past medical history on file.  Current Outpatient Medications  Medication Sig Dispense Refill   ALPRAZolam  (XANAX ) 0.5 MG tablet Take 1 tablet (0.5 mg total) by mouth daily as needed for anxiety. 10 tablet 0   Azelaic Acid 15 % gel Apply 1 Application topically 2 (two) times daily. 50 g 0   clobetasol  ointment (TEMOVATE ) 0.05 % Apply 1 Application topically 2 (two) times daily. 100 g 1   dicyclomine  (BENTYL ) 10 MG capsule Take 1 capsule (10 mg total) by mouth 4 (four) times daily -  before meals and at bedtime. 90 capsule 0   zolpidem  (AMBIEN  CR) 12.5 MG CR tablet TAKE 1 TABLET BY MOUTH AT BEDTIME AS NEEDED FOR SLEEP. 15 tablet 1   No current facility-administered medications for this visit.    No Known Allergies  Family History  Problem Relation Age  of Onset   Prostate cancer Father    Cancer Maternal Uncle        throat   Breast cancer Maternal Grandmother 45   Diabetes Neg Hx    Early death Neg Hx    Heart disease Neg Hx    Stroke Neg Hx     Social History   Socioeconomic History   Marital status: Married    Spouse name: Not on file   Number of children: Not on file   Years of education: Not on file   Highest education level: Associate degree: occupational, Scientist, product/process development, or vocational program  Occupational History   Not on file  Tobacco Use   Smoking status: Never   Smokeless tobacco: Never  Vaping Use   Vaping status: Never Used  Substance and Sexual Activity   Alcohol use: Yes    Alcohol/week: 1.0 standard drink of alcohol    Types: 1 Shots of liquor per week    Comment: occasional   Drug use: No   Sexual activity: Yes  Other Topics Concern   Not on file  Social History Narrative   Not on file   Social Drivers of Health   Financial Resource Strain: Medium Risk (01/29/2024)   Overall Financial Resource Strain (CARDIA)    Difficulty of Paying Living Expenses: Somewhat hard  Food Insecurity: No Food Insecurity (01/29/2024)   Hunger Vital Sign    Worried About Running Out of Food in the Last Year: Never true  Ran Out of Food in the Last Year: Never true  Transportation Needs: No Transportation Needs (01/29/2024)   PRAPARE - Administrator, Civil Service (Medical): No    Lack of Transportation (Non-Medical): No  Physical Activity: Insufficiently Active (01/29/2024)   Exercise Vital Sign    Days of Exercise per Week: 2 days    Minutes of Exercise per Session: 10 min  Stress: Stress Concern Present (01/29/2024)   [employer] of Occupational Health - Occupational Stress Questionnaire    Feeling of Stress: Rather much  Social Connections: Moderately Isolated (01/29/2024)   Social Connection and Isolation Panel    Frequency of Communication with Friends and Family: More than three times a week     Frequency of Social Gatherings with Friends and Family: Once a week    Attends Religious Services: Never    Database administrator or Organizations: No    Attends Engineer, structural: Not on file    Marital Status: Married  Catering manager Violence: Not on file     Constitutional: Denies fever, malaise, fatigue, headache or abrupt weight changes.  Respiratory: Denies difficulty breathing, shortness of breath, cough or sputum production.   Cardiovascular: Denies chest pain, chest tightness, palpitations or swelling in the hands or feet.  Gastrointestinal: Denies abdominal pain, bloating, constipation, diarrhea or blood in the stool.  GU: Patient reports dysuria.  Denies urgency, frequency, burning sensation, blood in urine, odor or discharge. Musculoskeletal: Patient reports intermittent low back pain (chronic)..  Denies decrease in range of motion, difficulty with gait, muscle pain or joint pain and swelling.  Skin: Patient reports rash of face.  Denies ulcerations.  Neurological: Denies dizziness, difficulty with memory, difficulty with speech or problems with balance and coordination.    No other specific complaints in a complete review of systems (except as listed in HPI above).      Objective:   Physical Exam  BP 120/74 (BP Location: Left Arm, Patient Position: Sitting, Cuff Size: Large)   Ht 5' 11.5 (1.816 m)   Wt 247 lb 9.6 oz (112.3 kg)   BMI 34.05 kg/m   Wt Readings from Last 3 Encounters:  11/17/23 246 lb 6.4 oz (111.8 kg)  08/27/23 245 lb 3.2 oz (111.2 kg)  02/24/23 246 lb (111.6 kg)    General: Appears his stated age, obese, in NAD. Skin: Warm, dry and intact.  Hyperpigmented, nonblanchable, papular rash noted of nasal bridge and bilateral cheeks.  Cardiovascular: Normal rate and rhythm.  Pulmonary/Chest: Normal effort and positive vesicular breath sounds. No respiratory distress. No wheezes, rales or ronchi noted.  Abdomen: Soft and nontender. No CVA  tenderness noted. Musculoskeletal: No difficulty with gait.  Neurological: Alert and oriented.   BMET    Component Value Date/Time   NA 142 08/27/2023 0828   K 4.2 08/27/2023 0828   CL 104 08/27/2023 0828   CO2 29 08/27/2023 0828   GLUCOSE 91 08/27/2023 0828   BUN 8 08/27/2023 0828   CREATININE 1.05 08/27/2023 0828   CALCIUM 8.8 08/27/2023 0828    Lipid Panel     Component Value Date/Time   CHOL 112 08/27/2023 0828   TRIG 58 08/27/2023 0828   HDL 37 (L) 08/27/2023 0828   CHOLHDL 3.0 08/27/2023 0828   VLDL 16.4 09/12/2020 1534   LDLCALC 62 08/27/2023 0828    CBC    Component Value Date/Time   WBC 5.3 08/27/2023 0828   RBC 4.77 08/27/2023 0828   HGB 12.7 (L)  08/27/2023 0828   HCT 38.7 08/27/2023 0828   PLT 141 08/27/2023 0828   MCV 81.1 08/27/2023 0828   MCH 26.6 (L) 08/27/2023 0828   MCHC 32.8 08/27/2023 0828   RDW 16.6 (H) 08/27/2023 0828    Hgb A1C Lab Results  Component Value Date   HGBA1C 4.5 08/27/2023            Assessment & Plan:   Assessment and Plan    Suspected Lupus Facial rash worsened. Labs suggest lupus. Missed rheumatology follow-up. Immediate rheumatology consultation needed due to potential complications. - Instruct to reschedule rheumatology appointment. - Provide rheumatology contact information. - Instruct to request rheumatology to contact provider if new referral needed.  Dysuria Intermittent urinary symptoms. Differential includes UTI. Partner has had UTI and herpes. No herpes lesions observed in him. - Urinalysis shows trace blood -Will obtain urine culture - Order urine test for trichomonas, gonorrhea and chlamydia.    RTC in 1 month for your annual exam Helayne Lo, NP

## 2024-01-30 NOTE — Patient Instructions (Signed)
 Systemic Lupus Erythematosus, Adult Systemic lupus erythematosus (SLE) is a long-term (chronic) disease that can affect many parts of the body. SLE is an autoimmune disease. With this type of disease, the body's defense system (immune system) mistakenly attacks healthy tissues. This can cause damage to the skin, joints, blood vessels, brain, kidneys, lungs, heart, and other internal organs. It causes pain, irritation, and inflammation. What are the causes? The cause of this condition is not known. What increases the risk? The following factors may make you more likely to develop this condition: Being male. Being of Asian, Hispanic, or African American descent. Having a family history of the condition. Being exposed to tobacco smoke or smoking cigarettes. Having an infection with a virus, such as Epstein-Barr virus. Having a history of exposure to silica dust, metals, chemicals, mold or mildew, or insecticides. Using oral contraceptives or hormone replacement therapy. What are the signs or symptoms? This condition can affect almost any organ or system in the body. Symptoms of the condition depend on which organ or system is affected. The most common symptoms include: Fever. Tiredness (fatigue). Weight loss. Muscle aches. Joint pain. Skin rashes, especially over the nose and cheeks (butterfly rash) and after sun exposure. Symptoms can come and go. A period of time when symptoms get worse or come back is called a flare. A period of time with no symptoms is called a remission. How is this diagnosed? This condition is diagnosed based on: Your symptoms. Your medical history. A physical exam. You may also have tests, including: Blood tests. Urine tests. A chest X-ray. You may be referred to an autoimmune disease specialist (rheumatologist). How is this treated? There is no cure for this condition, but treatment can help to control symptoms, prevent flares (keep symptoms in remission),  and prevent damage to the heart, lungs, kidneys, and other organs. Treatment will depend on what symptoms you are having and what organs or systems are affected. Treatment may involve taking a combination of medicines over time. Common medicines used to treat this condition include: Antimalarial medicines to control symptoms, prevent flares, and protect against organ damage. Corticosteroids and NSAIDs to reduce inflammation. Medicines to weaken your immune system (immunosuppressants). Biologic response modifiers to reduce inflammation and damage. Follow these instructions at home: Medicines Take over-the-counter and prescription medicines only as told by your health care provider. Do not take any medicines that contain estrogen without first checking with your health care provider. Estrogen can trigger flares and may increase your risk for blood clots. Eating and drinking Eat a heart-healthy diet. This may include: Eating high-fiber foods, such as beans, whole grains, and fresh fruits and vegetables. Eating heart-healthy fats (omega-3 fats), such as fish, flaxseed, and flaxseed oil. Limiting foods that are high in saturated fat and cholesterol, such as processed and fried foods, fatty meat, and full-fat dairy. Limiting how much salt (sodium) you eat. Include calcium and vitamin D in your diet. Good sources of calcium and vitamin D include: Low-fat dairy products such as milk, yogurt, and cheese. Certain fish, such as fresh or canned salmon, tuna, and sardines. Products that have calcium and vitamin D added to them (are fortified), such as fortified cereals or juice. Lifestyle     Stay active, as directed by your health care provider. Do not use any products that contain nicotine or tobacco. These products include cigarettes, chewing tobacco, and vaping devices, such as e-cigarettes. If you need help quitting, ask your health care provider. Protect your skin from the sun by  applying sunblock  and wearing protective hats and clothing. Learn as much as you can about your condition and have a good support system in place. Support may come from family, friends, or a lupus support group. General instructions Work closely with all of your health care providers to manage your condition. Stay up to date on all vaccines as told by your health care provider. Keep all follow-up visits. This is important. Contact a health care provider if: You have a fever. Your symptoms flare. You develop new symptoms. You have an upper respiratory infection. You have bloody, foamy, or coffee-colored urine. There are changes in your urination. For example, you urinate more often at night. You think that you may be depressed or have anxiety. You become pregnant or plan to become pregnant. Pregnancy in women with this condition is considered high risk. Get help right away if: You have chest pain. You have trouble breathing. You have a seizure. You suddenly get a very bad headache. You suddenly develop facial or body weakness. You cannot speak. You cannot understand speech. These symptoms may be an emergency. Get help right away. Call 911. Do not wait to see if the symptoms will go away. Do not drive yourself to the hospital. Summary Systemic lupus erythematosus (SLE) is a long-term disease that can affect many parts of the body. SLE is an autoimmune disease. That means your body's defense system (immune system) mistakenly attacks healthy tissues. There is no cure for this condition, but treatment can help to control symptoms, prevent flares, and prevent damage to your organs. Treatment may involve taking a combination of medicines over time. This information is not intended to replace advice given to you by your health care provider. Make sure you discuss any questions you have with your health care provider. Document Revised: 05/04/2021 Document Reviewed: 05/04/2021 Elsevier Patient Education  2024  [employer].

## 2024-01-30 NOTE — Telephone Encounter (Signed)
 Copied from CRM [id number]. Topic: Referral - Request for Referral >> Jan 30, 2024 11:21 AM Elle L wrote: Did the patient discuss referral with their provider in the last year? Yes  Appointment offered? No, the patient had an appointment earlier today and was advised to contact the office if the [facility] did require a referral and they do.   Type of order/referral and detailed reason for visit: He had test results that came back as possible lupus.   Preference of office, provider, location: [facility] [address] [place], [place] [address] [phone] phone [phone] fax M-Th 8-5 Fridays 8-12   If referral order, have you been seen by this specialty before? No  Can we respond through MyChart? Yes

## 2024-01-30 NOTE — Addendum Note (Signed)
 Addended by: Carollynn Cirri on: 01/30/2024 11:39 AM   Modules accepted: Orders

## 2024-01-30 NOTE — Telephone Encounter (Signed)
 Urgent referral placed

## 2024-01-31 LAB — URINE CULTURE
MICRO NUMBER:: 16607408
Result:: NO GROWTH
SPECIMEN QUALITY:: ADEQUATE

## 2024-02-02 ENCOUNTER — Ambulatory Visit: Admitting: Internal Medicine

## 2024-02-02 LAB — URINE CYTOLOGY ANCILLARY ONLY
Chlamydia: NEGATIVE
Comment: NEGATIVE
Comment: NEGATIVE
Comment: NORMAL
Neisseria Gonorrhea: NEGATIVE
Trichomonas: NEGATIVE

## 2024-02-03 NOTE — Telephone Encounter (Signed)
 I have placed an urgent referral to dermatology for this patient who has lupus that is not currently being treated.  They scheduled him in December.  Is there anything that we can do to get him a sooner appointment?

## 2024-02-09 NOTE — Telephone Encounter (Signed)
 Okay for note saying that he cannot work outside in the sun

## 2024-02-09 NOTE — Telephone Encounter (Signed)
 Could you look into this for me?  The referral was placed as an urgent referral.  He is saying if we resend it is urgent that they can see him sooner.

## 2024-02-10 ENCOUNTER — Telehealth: Admitting: Physician Assistant

## 2024-02-10 ENCOUNTER — Encounter: Payer: Self-pay | Admitting: Internal Medicine

## 2024-02-10 DIAGNOSIS — S86812A Strain of other muscle(s) and tendon(s) at lower leg level, left leg, initial encounter: Secondary | ICD-10-CM

## 2024-02-10 NOTE — Patient Instructions (Signed)
  Sudie Bracket, thank you for joining [name], PA-C for today's virtual visit.  While this provider is not your primary care provider (PCP), if your PCP is located in our provider database this encounter information will be shared with them immediately following your visit.   A [facility] MyChart account gives you access to today's visit and all your visits, tests, and labs performed at [facility]  click here if you don't have a [facility] MyChart account or go to [email or web address]  Consent: (Patient) Patrick Hahn provided verbal consent for this virtual visit at the beginning of the encounter.  Current Medications:  Current Outpatient Medications:    ALPRAZolam  (XANAX ) 0.5 MG tablet, Take 1 tablet (0.5 mg total) by mouth daily as needed for anxiety., Disp: 10 tablet, Rfl: 0   Azelaic Acid 15 % gel, Apply 1 Application topically 2 (two) times daily., Disp: 50 g, Rfl: 0   clobetasol  ointment (TEMOVATE ) 0.05 %, Apply 1 Application topically 2 (two) times daily., Disp: 100 g, Rfl: 1   dicyclomine  (BENTYL ) 10 MG capsule, Take 1 capsule (10 mg total) by mouth 4 (four) times daily -  before meals and at bedtime. (Patient not taking: Reported on 01/30/2024), Disp: 90 capsule, Rfl: 0   zolpidem  (AMBIEN  CR) 12.5 MG CR tablet, TAKE 1 TABLET BY MOUTH AT BEDTIME AS NEEDED FOR SLEEP., Disp: 15 tablet, Rfl: 1   Medications ordered in this encounter:  No orders of the defined types were placed in this encounter.    *If you need refills on other medications prior to your next appointment, please contact your pharmacy*  Follow-Up: Call back or seek an in-person evaluation if the symptoms worsen or if the condition fails to improve as anticipated.  [facility] [phone]  Other Instructions Please report to the nearest Emergency room with any worsening symptoms. Follow up with primary care provider (PCP) in 2 -3 days.    If you have been instructed to have  an in-person evaluation today at a local Urgent Care facility, please use the link below. It will take you to a list of all of our available [facility], including address, phone number and hours of operation. Please do not delay care.  [facility]  If you or a family member do not have a primary care provider, use the link below to schedule a visit and establish care. When you choose a [facility] primary care physician or advanced practice provider, you gain a long-term partner in health. Find a Primary Care Provider  Learn more about [facility]'s in-office and virtual care options: [place] - Get Care Now

## 2024-02-10 NOTE — Progress Notes (Signed)
 Virtual Visit Consent   Patrick Hahn, you are scheduled for a virtual visit with a  provider today. Just as with appointments in the office, your consent must be obtained to participate. Your consent will be active for this visit and any virtual visit you may have with one of our providers in the next 365 days. If you have a MyChart account, a copy of this consent can be sent to you electronically.  As this is a virtual visit, video technology does not allow for your provider to perform a traditional examination. This may limit your provider's ability to fully assess your condition. If your provider identifies any concerns that need to be evaluated in person or the need to arrange testing (such as labs, EKG, etc.), we will make arrangements to do so. Although advances in technology are sophisticated, we cannot ensure that it will always work on either your end or our end. If the connection with a video visit is poor, the visit may have to be switched to a telephone visit. With either a video or telephone visit, we are not always able to ensure that we have a secure connection.  By engaging in this virtual visit, you consent to the provision of healthcare and authorize for your insurance to be billed (if applicable) for the services provided during this visit. Depending on your insurance coverage, you may receive a charge related to this service.  I need to obtain your verbal consent now. Are you willing to proceed with your visit today? Elvie Maines has provided verbal consent on 02/10/2024 for a virtual visit (video or telephone). Patrick Hahn, [place]  Date: 02/10/2024 4:02 PM   Virtual Visit via Video Note   I, Patrick Hahn, connected with  Janiel Crisostomo  ([id number], 1981/05/29) on 02/10/24 at  4:00 PM EDT by a video-enabled telemedicine application and verified that I am speaking with the correct person using two identifiers.  Location: Patient: Virtual Visit Location Patient:  Home Provider: Virtual Visit Location Provider: Home Office   I discussed the limitations of evaluation and management by telemedicine and the availability of in person appointments. The patient expressed understanding and agreed to proceed.    History of Present Illness: Patrick Hahn is a 43 y.o. who identifies as a male who was assigned male at birth, and is being seen today for left leg pain.  HPI: Leg Pain  The incident occurred 6 to 12 hours ago. The incident occurred at home. There was no injury mechanism. The pain is present in the left leg. The patient is experiencing no pain. The pain has been Improving since onset. He reports no foreign bodies present. Nothing aggravates the symptoms. He has tried nothing for the symptoms. The treatment provided significant relief.    Problems:  Patient Active Problem List   Diagnosis Date Noted   Class 1 obesity due to excess calories with body mass index (BMI) of 33.0 to 33.9 in adult 02/24/2023   Thrombocytopenia (HCC) 01/15/2022   Iron deficiency anemia 01/15/2022   Psoriasis 01/15/2022   Anxiety with flying 12/14/2020   Chronic back pain 05/05/2017    Allergies: No Known Allergies Medications:  Current Outpatient Medications:    ALPRAZolam  (XANAX ) 0.5 MG tablet, Take 1 tablet (0.5 mg total) by mouth daily as needed for anxiety., Disp: 10 tablet, Rfl: 0   Azelaic Acid 15 % gel, Apply 1 Application topically 2 (two) times daily., Disp: 50 g, Rfl: 0   clobetasol  ointment (TEMOVATE ) 0.05 %, Apply  1 Application topically 2 (two) times daily., Disp: 100 g, Rfl: 1   dicyclomine  (BENTYL ) 10 MG capsule, Take 1 capsule (10 mg total) by mouth 4 (four) times daily -  before meals and at bedtime. (Patient not taking: Reported on 01/30/2024), Disp: 90 capsule, Rfl: 0   zolpidem  (AMBIEN  CR) 12.5 MG CR tablet, TAKE 1 TABLET BY MOUTH AT BEDTIME AS NEEDED FOR SLEEP., Disp: 15 tablet, Rfl: 1  Observations/Objective: Patient is well-developed,  well-nourished in no acute distress.  Resting comfortably  at home.  Head is normocephalic, atraumatic.  No labored breathing.  Speech is clear and coherent with logical content.  Patient is alert and oriented at baseline.  Left leg appears normal on exam.   Assessment and Plan: 1. Strain of calf muscle, left, initial encounter (Primary)  Patient is a 43 year old male presenting with left calf pain.  No known trauma at this time.  No falls no recent travel or prolonged travel in airplane.  Upon observation his leg is not swollen there is no skin color changes he has no difficulty ambulating.  He states his symptoms have resolved at this time.  Given his presentation and no known mechanism I advised the patient to continue to monitor his symptoms and should they return or his pain return or he notices skin color changes swelling difficulty ambulating, he should immediately present for evaluation.  Advised that he could take Tylenol ibuprofen  as needed for pain and also counseled him on rest ice compression and elevation.  All questions were answered during visit on today patient verbalized understanding.  Follow Up Instructions: I discussed the assessment and treatment plan with the patient. The patient was provided an opportunity to ask questions and all were answered. The patient agreed with the plan and demonstrated an understanding of the instructions.  A copy of instructions were sent to the patient via MyChart unless otherwise noted below.    The patient was advised to call back or seek an in-person evaluation if the symptoms worsen or if the condition fails to improve as anticipated.    Patrick Shuck, PA-C

## 2024-02-17 ENCOUNTER — Other Ambulatory Visit: Payer: Self-pay | Admitting: Internal Medicine

## 2024-02-19 NOTE — Telephone Encounter (Signed)
 Requested medications are due for refill today.  yes  Requested medications are on the active medications list.  yes  Last refill. 11/10/2023 #15 1 rf  Future visit scheduled.   yes  Notes to clinic.  Refill not delegated.    Requested Prescriptions  Pending Prescriptions Disp Refills   zolpidem  (AMBIEN  CR) 12.5 MG CR tablet [Pharmacy Med Name: ZOLPIDEM  TART ER 12.5 MG TAB] 15 tablet 1    Sig: TAKE 1 TABLET BY MOUTH EVERY DAY AT BEDTIME AS NEEDED FOR SLEEP     Not Delegated - Psychiatry:  Anxiolytics/Hypnotics Failed - 02/19/2024 12:11 PM      Failed - This refill cannot be delegated      Failed - Urine Drug Screen completed in last 360 days      Passed - Valid encounter within last 6 months    Recent Outpatient Visits           2 weeks ago Dysuria   [facility], Angeline ORN, NP   3 months ago Mass of left axilla   [facility] Arm[facility], Angeline ORN, NP       Future Appointments             In 1 week Antonette, Angeline ORN, NP [facility]   In 3 months Dolphus Reiter, MD [facility] - A Dept Of [place]. [facility]   In 3 months Alm Delon SAILOR, DO [facility]

## 2024-02-26 ENCOUNTER — Encounter: Payer: Self-pay | Admitting: Internal Medicine

## 2024-02-26 ENCOUNTER — Ambulatory Visit (INDEPENDENT_AMBULATORY_CARE_PROVIDER_SITE_OTHER): Payer: Self-pay | Admitting: Internal Medicine

## 2024-02-26 VITALS — BP 118/74 | Ht 71.5 in | Wt 247.0 lb

## 2024-02-26 DIAGNOSIS — R1033 Periumbilical pain: Secondary | ICD-10-CM

## 2024-02-26 DIAGNOSIS — E6609 Other obesity due to excess calories: Secondary | ICD-10-CM

## 2024-02-26 DIAGNOSIS — Z0001 Encounter for general adult medical examination with abnormal findings: Secondary | ICD-10-CM

## 2024-02-26 DIAGNOSIS — E66811 Obesity, class 1: Secondary | ICD-10-CM

## 2024-02-26 DIAGNOSIS — Z136 Encounter for screening for cardiovascular disorders: Secondary | ICD-10-CM | POA: Diagnosis not present

## 2024-02-26 DIAGNOSIS — Z6833 Body mass index (BMI) 33.0-33.9, adult: Secondary | ICD-10-CM

## 2024-02-26 DIAGNOSIS — R739 Hyperglycemia, unspecified: Secondary | ICD-10-CM | POA: Diagnosis not present

## 2024-02-26 LAB — CBC
HCT: 38.5 % (ref 38.5–50.0)
Hemoglobin: 12.5 g/dL — ABNORMAL LOW (ref 13.2–17.1)
MCH: 26.6 pg — ABNORMAL LOW (ref 27.0–33.0)
MCHC: 32.5 g/dL (ref 32.0–36.0)
MCV: 81.9 fL (ref 80.0–100.0)
MPV: 11.4 fL (ref 7.5–12.5)
Platelets: 109 Thousand/uL — ABNORMAL LOW (ref 140–400)
RBC: 4.7 Million/uL (ref 4.20–5.80)
RDW: 17 % — ABNORMAL HIGH (ref 11.0–15.0)
WBC: 4.9 Thousand/uL (ref 3.8–10.8)

## 2024-02-26 LAB — LIPID PANEL
Cholesterol: 114 mg/dL (ref <200)
HDL: 36 mg/dL — ABNORMAL LOW (ref >=40)
LDL Cholesterol (Calc): 63 mg/dL
Non-HDL Cholesterol (Calc): 78 mg/dL (ref <130)
Total CHOL/HDL Ratio: 3.2 (calc) (ref <5.0)
Triglycerides: 74 mg/dL (ref <150)

## 2024-02-26 LAB — COMPREHENSIVE METABOLIC PANEL WITH GFR
AG Ratio: 1.3 (calc) (ref 1.0–2.5)
ALT: 45 U/L (ref 9–46)
AST: 31 U/L (ref 10–40)
Albumin: 4 g/dL (ref 3.6–5.1)
Alkaline phosphatase (APISO): 75 U/L (ref 36–130)
BUN: 10 mg/dL (ref 7–25)
CO2: 28 mmol/L (ref 20–32)
Calcium: 9 mg/dL (ref 8.6–10.3)
Chloride: 104 mmol/L (ref 98–110)
Creat: 1.02 mg/dL (ref 0.60–1.29)
Globulin: 3 g/dL (ref 1.9–3.7)
Glucose, Bld: 95 mg/dL (ref 65–99)
Potassium: 3.7 mmol/L (ref 3.5–5.3)
Sodium: 139 mmol/L (ref 135–146)
Total Bilirubin: 1.6 mg/dL — ABNORMAL HIGH (ref 0.2–1.2)
Total Protein: 7 g/dL (ref 6.1–8.1)
eGFR: 94 mL/min/1.73m2 (ref >=60)

## 2024-02-26 LAB — HEMOGLOBIN A1C
Hgb A1c MFr Bld: 4.4 % (ref <5.7)
Mean Plasma Glucose: 80 mg/dL
eAG (mmol/L): 4.4 mmol/L

## 2024-02-26 NOTE — Progress Notes (Signed)
 Subjective:    Patient ID: Patrick Hahn, male    DOB: [birth year], 43 y.o.   MRN: [id number]  HPI  Patient presents to clinic today for his annual exam.  He also reports periumbilical pain.  He reports this started 6 days ago after sneezing.  He reports he originally felt a mass in the periumbilical region which he was able to pop back again.  He has had some excessive gassiness since that time but denies nausea, vomiting, diarrhea or blood in his stool.  Flu: Never Tetanus: 02/2016 COVID: X 2 Dentist: biannually Vision screening: annually  Diet: He does eat meat. He consumes some fruits and veggies. He does eat fried foods. He drinks mostly infused water, soda. Exercise: None  Review of Systems     No past medical history on file.  Current Outpatient Medications  Medication Sig Dispense Refill   ALPRAZolam  (XANAX ) 0.5 MG tablet Take 1 tablet (0.5 mg total) by mouth daily as needed for anxiety. 10 tablet 0   Azelaic Acid 15 % gel Apply 1 Application topically 2 (two) times daily. 50 g 0   clobetasol  ointment (TEMOVATE ) 0.05 % Apply 1 Application topically 2 (two) times daily. 100 g 1   dicyclomine  (BENTYL ) 10 MG capsule Take 1 capsule (10 mg total) by mouth 4 (four) times daily -  before meals and at bedtime. (Patient not taking: Reported on 01/30/2024) 90 capsule 0   zolpidem  (AMBIEN  CR) 12.5 MG CR tablet TAKE 1 TABLET BY MOUTH EVERY DAY AT BEDTIME AS NEEDED FOR SLEEP 15 tablet 1   No current facility-administered medications for this visit.    No Known Allergies  Family History  Problem Relation Age of Onset   Prostate cancer Father    Cancer Maternal Uncle        throat   Breast cancer Maternal Grandmother 56   Diabetes Neg Hx    Early death Neg Hx    Heart disease Neg Hx    Stroke Neg Hx     Social History   Socioeconomic History   Marital status: Married    Spouse name: Not on file   Number of children: Not on file   Years of education: Not on file    Highest education level: Associate degree: occupational, Scientist, product/process development, or vocational program  Occupational History   Not on file  Tobacco Use   Smoking status: Never   Smokeless tobacco: Never  Vaping Use   Vaping status: Never Used  Substance and Sexual Activity   Alcohol use: Yes    Alcohol/week: 1.0 standard drink of alcohol    Types: 1 Shots of liquor per week    Comment: occasional   Drug use: No   Sexual activity: Yes  Other Topics Concern   Not on file  Social History Narrative   Not on file   Social Drivers of Health   Financial Resource Strain: Medium Risk (01/29/2024)   Overall Financial Resource Strain (CARDIA)    Difficulty of Paying Living Expenses: Somewhat hard  Food Insecurity: No Food Insecurity (01/29/2024)   Hunger Vital Sign    Worried About Running Out of Food in the Last Year: Never true    Ran Out of Food in the Last Year: Never true  Transportation Needs: No Transportation Needs (01/29/2024)   PRAPARE - Administrator, Civil Service (Medical): No    Lack of Transportation (Non-Medical): No  Physical Activity: Insufficiently Active (01/29/2024)   Exercise Vital Sign  Days of Exercise per Week: 2 days    Minutes of Exercise per Session: 10 min  Stress: Stress Concern Present (01/29/2024)   [employer] of Occupational Health - Occupational Stress Questionnaire    Feeling of Stress: Rather much  Social Connections: Moderately Isolated (01/29/2024)   Social Connection and Isolation Panel    Frequency of Communication with Friends and Family: More than three times a week    Frequency of Social Gatherings with Friends and Family: Once a week    Attends Religious Services: Never    Database administrator or Organizations: No    Attends Engineer, structural: Not on file    Marital Status: Married  Catering manager Violence: Not on file     Constitutional: Denies fever, malaise, fatigue, headache or abrupt weight changes.  HEENT:  Denies eye pain, eye redness, ear pain, ringing in the ears, wax buildup, runny nose, nasal congestion, bloody nose, or sore throat. Respiratory: Denies difficulty breathing, shortness of breath, cough or sputum production.   Cardiovascular: Denies chest pain, chest tightness, palpitations or swelling in the hands or feet.  Gastrointestinal: Pt reports periumbilical abdominal pain. Denies abdominal pain, bloating, constipation, diarrhea or blood in the stool.  GU: Denies urgency, frequency, pain with urination, burning sensation, blood in urine, odor or discharge. Musculoskeletal: Patient reports intermittent low back pain.  Denies decrease in range of motion, difficulty with gait, or joint swelling.  Skin: Patient reports chronic eczema, rash of face denies ulcercations.  Neurological: Denies dizziness, difficulty with memory, difficulty with speech or problems with balance and coordination.  Psych: Patient has a history of intermittent anxiety.  Denies depression, SI/HI.  No other specific complaints in a complete review of systems (except as listed in HPI above).  Objective:   Physical Exam BP 118/74 (BP Location: Left Arm, Patient Position: Sitting, Cuff Size: Normal)   Ht 5' 11.5 (1.816 m)   Wt 247 lb (112 kg)   BMI 33.97 kg/m    Wt Readings from Last 3 Encounters:  01/30/24 247 lb 9.6 oz (112.3 kg)  11/17/23 246 lb 6.4 oz (111.8 kg)  08/27/23 245 lb 3.2 oz (111.2 kg)    General: Appears his stated age, obese, in NAD. Skin: Warm, dry and intact.  Psoriatic patches noted of the face, upper and lower extremities.  Papular rash noted of bilateral cheeks and nasal bridge. HEENT: Head: normal shape and size; Eyes: sclera white, no icterus, conjunctiva pink, PERRLA and EOMs intact;  Neck:  Neck supple, trachea midline. No masses, lumps or thyromegaly present.  Cardiovascular: Normal rate and rhythm. S1,S2 noted.  No murmur, rubs or gallops noted. No JVD or BLE edema.  Pulmonary/Chest:  Normal effort and positive vesicular breath sounds. No respiratory distress. No wheezes, rales or ronchi noted.  Abdomen: Soft and nontender. Normal bowel sounds.  No distention or palpable masses noted. Musculoskeletal: No bony tenderness noted over the lumbar spine.  Strength 5/5 BUE/BLE. No difficulty with gait.  Neurological: Alert and oriented. Cranial nerves II-XII grossly intact. Coordination normal.  Psychiatric: Mood and affect normal. Behavior is normal. Judgment and thought content normal.     BMET    Component Value Date/Time   NA 142 08/27/2023 0828   K 4.2 08/27/2023 0828   CL 104 08/27/2023 0828   CO2 29 08/27/2023 0828   GLUCOSE 91 08/27/2023 0828   BUN 8 08/27/2023 0828   CREATININE 1.05 08/27/2023 0828   CALCIUM 8.8 08/27/2023 0828    Lipid  Panel     Component Value Date/Time   CHOL 112 08/27/2023 0828   TRIG 58 08/27/2023 0828   HDL 37 (L) 08/27/2023 0828   CHOLHDL 3.0 08/27/2023 0828   VLDL 16.4 09/12/2020 1534   LDLCALC 62 08/27/2023 0828    CBC    Component Value Date/Time   WBC 5.3 08/27/2023 0828   RBC 4.77 08/27/2023 0828   HGB 12.7 (L) 08/27/2023 0828   HCT 38.7 08/27/2023 0828   PLT 141 08/27/2023 0828   MCV 81.1 08/27/2023 0828   MCH 26.6 (L) 08/27/2023 0828   MCHC 32.8 08/27/2023 0828   RDW 16.6 (H) 08/27/2023 0828    Hgb A1C Lab Results  Component Value Date   HGBA1C 4.5 08/27/2023            Assessment & Plan:   Preventative health maintenance:  Encouraged him to get a flu shot in the fall Tetanus UTD Encouraged him to get his COVID booster Encouraged him to consume a balanced diet and exercise regimen Advised him to see an eye doctor and dentist annually We will check CBC, c-Met, lipid, A1c today  Periumbilical abdominal pain:  No discrete mass or hernia noted on exam Will obtain ultrasound abdomen for further evaluation Consider referral to general surgery for further evaluation and treatment  RTC in 6 months,  follow-up chronic conditions Angeline Laura, NP

## 2024-02-26 NOTE — Assessment & Plan Note (Signed)
 Encourage diet and exercise for weight loss

## 2024-02-26 NOTE — Patient Instructions (Signed)
 Health Maintenance, Male  Adopting a healthy lifestyle and getting preventive care are important in promoting health and wellness. Ask your health care provider about:  The right schedule for you to have regular tests and exams.  Things you can do on your own to prevent diseases and keep yourself healthy.  What should I know about diet, weight, and exercise?  Eat a healthy diet    Eat a diet that includes plenty of vegetables, fruits, low-fat dairy products, and lean protein.  Do not eat a lot of foods that are high in solid fats, added sugars, or sodium.  Maintain a healthy weight  Body mass index (BMI) is a measurement that can be used to identify possible weight problems. It estimates body fat based on height and weight. Your health care provider can help determine your BMI and help you achieve or maintain a healthy weight.  Get regular exercise  Get regular exercise. This is one of the most important things you can do for your health. Most adults should:  Exercise for at least 150 minutes each week. The exercise should increase your heart rate and make you sweat (moderate-intensity exercise).  Do strengthening exercises at least twice a week. This is in addition to the moderate-intensity exercise.  Spend less time sitting. Even light physical activity can be beneficial.  Watch cholesterol and blood lipids  Have your blood tested for lipids and cholesterol at 42 years of age, then have this test every 5 years.  You may need to have your cholesterol levels checked more often if:  Your lipid or cholesterol levels are high.  You are older than 42 years of age.  You are at high risk for heart disease.  What should I know about cancer screening?  Many types of cancers can be detected early and may often be prevented. Depending on your health history and family history, you may need to have cancer screening at various ages. This may include screening for:  Colorectal cancer.  Prostate cancer.  Skin cancer.  Lung  cancer.  What should I know about heart disease, diabetes, and high blood pressure?  Blood pressure and heart disease  High blood pressure causes heart disease and increases the risk of stroke. This is more likely to develop in people who have high blood pressure readings or are overweight.  Talk with your health care provider about your target blood pressure readings.  Have your blood pressure checked:  Every 3-5 years if you are 9-95 years of age.  Every year if you are 85 years old or older.  If you are between the ages of 29 and 29 and are a current or former smoker, ask your health care provider if you should have a one-time screening for abdominal aortic aneurysm (AAA).  Diabetes  Have regular diabetes screenings. This checks your fasting blood sugar level. Have the screening done:  Once every three years after age 23 if you are at a normal weight and have a low risk for diabetes.  More often and at a younger age if you are overweight or have a high risk for diabetes.  What should I know about preventing infection?  Hepatitis B  If you have a higher risk for hepatitis B, you should be screened for this virus. Talk with your health care provider to find out if you are at risk for hepatitis B infection.  Hepatitis C  Blood testing is recommended for:  Everyone born from [birth year] through 1964.  Anyone  with known risk factors for hepatitis C.  Sexually transmitted infections (STIs)  You should be screened each year for STIs, including gonorrhea and chlamydia, if:  You are sexually active and are younger than 42 years of age.  You are older than 42 years of age and your health care provider tells you that you are at risk for this type of infection.  Your sexual activity has changed since you were last screened, and you are at increased risk for chlamydia or gonorrhea. Ask your health care provider if you are at risk.  Ask your health care provider about whether you are at high risk for HIV. Your health care provider  may recommend a prescription medicine to help prevent HIV infection. If you choose to take medicine to prevent HIV, you should first get tested for HIV. You should then be tested every 3 months for as long as you are taking the medicine.  Follow these instructions at home:  Alcohol use  Do not drink alcohol if your health care provider tells you not to drink.  If you drink alcohol:  Limit how much you have to 0-2 drinks a day.  Know how much alcohol is in your drink. In the [place] one drink equals one 12 oz bottle of beer (355 mL), one 5 oz glass of wine (148 mL), or one 1 oz glass of hard liquor (44 mL).  Lifestyle  Do not use any products that contain nicotine or tobacco. These products include cigarettes, chewing tobacco, and vaping devices, such as e-cigarettes. If you need help quitting, ask your health care provider.  Do not use street drugs.  Do not share needles.  Ask your health care provider for help if you need support or information about quitting drugs.  General instructions  Schedule regular health, dental, and eye exams.  Stay current with your vaccines.  Tell your health care provider if:  You often feel depressed.  You have ever been abused or do not feel safe at home.  Summary  Adopting a healthy lifestyle and getting preventive care are important in promoting health and wellness.  Follow your health care provider's instructions about healthy diet, exercising, and getting tested or screened for diseases.  Follow your health care provider's instructions on monitoring your cholesterol and blood pressure.  This information is not intended to replace advice given to you by your health care provider. Make sure you discuss any questions you have with your health care provider.  Document Revised: 12/18/2020 Document Reviewed: 12/18/2020  Elsevier Patient Education  2024 [employer].

## 2024-02-27 ENCOUNTER — Ambulatory Visit: Payer: Self-pay | Admitting: Internal Medicine

## 2024-02-27 DIAGNOSIS — K429 Umbilical hernia without obstruction or gangrene: Secondary | ICD-10-CM

## 2024-03-04 ENCOUNTER — Ambulatory Visit
Admission: RE | Admit: 2024-03-04 | Discharge: 2024-03-04 | Disposition: A | Discharge status: Home / Self Care | Source: Ambulatory Visit | Attending: Internal Medicine | Admitting: Internal Medicine

## 2024-03-04 DIAGNOSIS — R1033 Periumbilical pain: Secondary | ICD-10-CM | POA: Diagnosis present

## 2024-03-09 ENCOUNTER — Telehealth: Payer: Self-pay

## 2024-03-09 NOTE — Telephone Encounter (Signed)
 Copied from CRM #[id number]. Topic: Clinical - Lab/Test Results >> Mar 09, 2024 10:56 AM Patrick Hahn wrote: Reason for CRM: Patient is calling in wanting to know his ultrasound results. Explained that the results had not been released and that someone would call when the results were ready.

## 2024-03-09 NOTE — Telephone Encounter (Signed)
 No further action needed at this time.

## 2024-03-18 ENCOUNTER — Ambulatory Visit: Payer: Self-pay | Admitting: General Surgery

## 2024-03-18 ENCOUNTER — Ambulatory Visit (INDEPENDENT_AMBULATORY_CARE_PROVIDER_SITE_OTHER): Admitting: General Surgery

## 2024-03-18 ENCOUNTER — Encounter: Payer: Self-pay | Admitting: General Surgery

## 2024-03-18 VITALS — BP 118/81 | HR 88 | Temp 98.3°F | Ht 71.0 in | Wt 250.0 lb

## 2024-03-18 DIAGNOSIS — K429 Umbilical hernia without obstruction or gangrene: Secondary | ICD-10-CM

## 2024-03-18 NOTE — Patient Instructions (Signed)
 You have requested for your Umbilical Hernia be repaired. This will be scheduled with Dr. Cornel Diesel at [facility].   If you are on any injectable weight loss medication, you will need to stop taking your GLP-1 injectable (weight loss) medications 8 days before your surgery to avoid any complications with anesthesia.   Please see your (blue)pre-care sheet for information. Our surgery scheduler will call you to verify surgery date and to go over information.   You will need to arrange to be off work for 1-2 weeks but will have to have a lifting restriction of no more than 15 lbs for 6 weeks following your surgery. If you have FMLA or disability paperwork that needs filled out you may drop this off at our office or this can be faxed to [phone].     Umbilical Hernia, Adult A hernia is a bulge of tissue that pushes through an opening between muscles. An umbilical hernia happens in the abdomen, near the belly button (umbilicus). The hernia may contain tissues from the small intestine, large intestine, or fatty tissue covering the intestines (omentum). Umbilical hernias in adults tend to get worse over time, and they require surgical treatment. There are several types of umbilical hernias. You may have: A hernia located just above or below the umbilicus (indirect hernia). This is the most common type of umbilical hernia in adults. A hernia that forms through an opening formed by the umbilicus (direct hernia). A hernia that comes and goes (reducible hernia). A reducible hernia may be visible only when you strain, lift something heavy, or cough. This type of hernia can be pushed back into the abdomen (reduced). A hernia that traps abdominal tissue inside the hernia (incarcerated hernia). This type of hernia cannot be reduced. A hernia that cuts off blood flow to the tissues inside the hernia (strangulated hernia). The tissues can start to die if this happens. This type of hernia  requires emergency treatment.  What are the causes? An umbilical hernia happens when tissue inside the abdomen presses on a weak area of the abdominal muscles. What increases the risk? You may have a greater risk of this condition if you: Are obese. Have had several pregnancies. Have a buildup of fluid inside your abdomen (ascites). Have had surgery that weakens the abdominal muscles.  What are the signs or symptoms? The main symptom of this condition is a painless bulge at or near the belly button. A reducible hernia may be visible only when you strain, lift something heavy, or cough. Other symptoms may include: Dull pain. A feeling of pressure.  Symptoms of a strangulated hernia may include: Pain that gets increasingly worse. Nausea and vomiting. Pain when pressing on the hernia. Skin over the hernia becoming red or purple. Constipation. Blood in the stool.  How is this diagnosed? This condition may be diagnosed based on: A physical exam. You may be asked to cough or strain while standing. These actions increase the pressure inside your abdomen and force the hernia through the opening in your muscles. Your health care provider may try to reduce the hernia by pressing on it. Your symptoms and medical history.  How is this treated? Surgery is the only treatment for an umbilical hernia. Surgery for a strangulated hernia is done as soon as possible. If you have a small hernia that is not incarcerated, you may need to lose weight before having surgery. Follow these instructions at home: Lose weight, if told by your health care provider.  Do not try to push the hernia back in. Watch your hernia for any changes in color or size. Tell your health care provider if any changes occur. You may need to avoid activities that increase pressure on your hernia. Do not lift anything that is heavier than 10 lb (4.5 kg) until your health care provider says that this is safe. Take over-the-counter  and prescription medicines only as told by your health care provider. Keep all follow-up visits as told by your health care provider. This is important. Contact a health care provider if: Your hernia gets larger. Your hernia becomes painful. Get help right away if: You develop sudden, severe pain near the area of your hernia. You have pain as well as nausea or vomiting. You have pain and the skin over your hernia changes color. You develop a fever. This information is not intended to replace advice given to you by your health care provider. Make sure you discuss any questions you have with your health care provider. Document Released: 12/29/2015 Document Revised: 03/31/2016 Document Reviewed: 12/29/2015 Elsevier Interactive Patient Education  [employer].

## 2024-03-18 NOTE — Progress Notes (Signed)
 Patient ID: Patrick Hahn, male   DOB: Jan 14, 1981, 43 y.o.   MRN: [id number] CC: Umbilical Hernia History of Present Illness Patrick Hahn is a 43 y.o. male with past medical history of anxiety and possible lupus who presents consultation for umbilical pain.  The patient reports that over the last several weeks he started to workout to try to lose weight.  Since then he noticed that there was a bulge in his umbilicus after sneezing.  He reports that the bulge will reduce on its own.  He says that when there is any increase in intra-abdominal pressure he does have some pain at the area.  He denies any overlying skin changes and symptoms of obstipation.  He has never had abdominal surgery before.  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  Past Medical History History reviewed. No pertinent past medical history.     Past Surgical History:  Procedure Laterality Date   GYNECOMASTIA EXCISION     43 years old    No Known Allergies  Current Outpatient Medications  Medication Sig Dispense Refill   ALPRAZolam  (XANAX ) 0.5 MG tablet Take 1 tablet (0.5 mg total) by mouth daily as needed for anxiety. 10 tablet 0   Azelaic Acid 15 % gel Apply 1 Application topically 2 (two) times daily. 50 g 0   clobetasol  ointment (TEMOVATE ) 0.05 % Apply 1 Application topically 2 (two) times daily. 100 g 1   zolpidem  (AMBIEN  CR) 12.5 MG CR tablet TAKE 1 TABLET BY MOUTH EVERY DAY AT BEDTIME AS NEEDED FOR SLEEP 15 tablet 1   No current facility-administered medications for this visit.    Family History Family History  Problem Relation Age of Onset   Prostate cancer Father    Cancer Maternal Uncle        throat   Breast cancer Maternal Grandmother 24   Diabetes Neg Hx    Early death Neg Hx    Heart disease Neg Hx    Stroke Neg Hx        Social History Social History   Tobacco Use   Smoking status: Never   Smokeless tobacco: Never  Vaping Use   Vaping status: Never Used  Substance Use Topics   Alcohol  use: Yes    Alcohol/week: 1.0 standard drink of alcohol    Types: 1 Shots of liquor per week    Comment: occasional   Drug use: No        ROS Full ROS of systems performed and is otherwise negative there than what is stated in the HPI  Physical Exam Blood pressure 118/81, pulse 88, temperature 98.3 F (36.8 C), temperature source Oral, height 5' 11 (1.803 m), weight 250 lb (113.4 kg), SpO2 99%.  Alert and oriented x 3, normal work of breathing on room air, regular rate and rhythm, abdomen is soft, there is some tenderness right at the umbilicus where the hernia is, there is a small approximately 1 cm defect at the umbilicus.  I do not feel any fat within the hernia on my exam.  With flexion of the abdomen he also has a small degree of diastases recti. Data Reviewed Ultrasound significant for umbilical hernia measuring approximately 1.2 cm.  I have personally reviewed the patient's imaging and medical records.    Assessment    43 year old who presents with several week history of abdominal bulge and pain.  He has an ultrasound and exam that are consistent with umbilical hernia.  I discussed with him that  given he is symptomatic I would recommend umbilical hernia repair.  Given the size of this I think we can do this open and repair primarily.  I discussed the risk, benefits alternatives of the procedure including risk of infection, bleeding, recurrence and damage to underlying structures.  I also discussed the importance of no heavy lifting greater than 10 to 15 pounds for 6 weeks after surgery.  He understands these risks and wishes to proceed with surgery.  A total of 45 minutes was spent reviewing the patient's chart, performing history and physical and discussing treatment options with the patient    Patrick Hahn Endow 03/18/2024, 9:16 AM

## 2024-03-20 ENCOUNTER — Encounter: Payer: Self-pay | Admitting: Urgent Care

## 2024-03-22 ENCOUNTER — Telehealth: Payer: Self-pay | Admitting: General Surgery

## 2024-03-22 NOTE — Telephone Encounter (Signed)
 Patient has been advised of Pre-Admission date/time, and Surgery date at [facility].  Surgery Date: 03/31/24 Preadmission Testing Date: 03/25/24 (phone 1p-4p)  Patient informed of the scheduling process and surgery information given at time of office visit.  Patient has been made aware to call [phone], between 1-3:00pm the day before surgery, to find out what time to arrive for surgery.

## 2024-03-25 ENCOUNTER — Inpatient Hospital Stay
Admission: RE | Admit: 2024-03-25 | Discharge: 2024-03-25 | Disposition: A | Discharge status: Home / Self Care | Source: Ambulatory Visit

## 2024-03-25 ENCOUNTER — Telehealth: Payer: Self-pay | Admitting: General Surgery

## 2024-03-25 HISTORY — DX: Psoriasis, unspecified: L40.9

## 2024-03-25 HISTORY — DX: Other chronic pain: G89.29

## 2024-03-25 HISTORY — DX: Iron deficiency anemia, unspecified: D50.9

## 2024-03-25 HISTORY — DX: Thrombocytopenia, unspecified: D69.6

## 2024-03-25 NOTE — Telephone Encounter (Signed)
 Patient calls to cancel surgery on 8/208/25 with Dr. Marinda for now.  Wanted to wait a month or so out, he is also going to [facility] towards end of September. Will follow up with Dr. Marinda on 05/13/24 for re-evaluation of hernia.

## 2024-03-31 ENCOUNTER — Encounter: Admission: RE | Payer: Self-pay | Source: Home / Self Care

## 2024-03-31 ENCOUNTER — Ambulatory Visit: Admission: RE | Admit: 2024-03-31 | Source: Home / Self Care | Admitting: General Surgery

## 2024-03-31 SURGERY — REPAIR, HERNIA, UMBILICAL, ADULT
Anesthesia: General

## 2024-04-22 ENCOUNTER — Other Ambulatory Visit: Payer: Self-pay | Admitting: Family Medicine

## 2024-04-23 NOTE — Telephone Encounter (Signed)
 Requested medications are due for refill today.  yes  Requested medications are on the active medications list.  yes  Last refill. 02/19/2024 #15 1 rf  Future visit scheduled.   no  Notes to clinic.  Refill not delegated.    Requested Prescriptions  Pending Prescriptions Disp Refills   zolpidem  (AMBIEN  CR) 12.5 MG CR tablet [Pharmacy Med Name: ZOLPIDEM  TART ER 12.5 MG TAB] 15 tablet 1    Sig: TAKE 1 TABLET BY MOUTH AT BEDTIME AS NEEDED FOR SLEEP     Not Delegated - Psychiatry:  Anxiolytics/Hypnotics Failed - 04/23/2024 11:21 AM      Failed - This refill cannot be delegated      Failed - Urine Drug Screen completed in last 360 days      Passed - Valid encounter within last 6 months    Recent Outpatient Visits           1 month ago Encounter for general adult medical examination with abnormal findings   [place] [facility] [place], Angeline ORN, NP   2 months ago Dysuria   [place] [facility] [place], Angeline ORN, NP   5 months ago Mass of left axilla   [place] [facility] [place], Angeline ORN, NP       Future Appointments             In 1 month Deveshwar, Maya, MD [facility] Rheumatology - A Dept Of [place]. [facility]   In 1 month Alm Delon SAILOR, DO [facility] Dermatology
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
## 2024-05-11 ENCOUNTER — Emergency Department
Admission: EM | Admit: 2024-05-11 | Discharge: 2024-05-11 | Disposition: A | Discharge status: Home / Self Care | Attending: Emergency Medicine | Admitting: Emergency Medicine

## 2024-05-11 ENCOUNTER — Emergency Department

## 2024-05-11 ENCOUNTER — Other Ambulatory Visit: Payer: Self-pay

## 2024-05-11 ENCOUNTER — Other Ambulatory Visit: Payer: Self-pay | Admitting: Internal Medicine

## 2024-05-11 DIAGNOSIS — Y92410 Unspecified street and highway as the place of occurrence of the external cause: Secondary | ICD-10-CM | POA: Insufficient documentation

## 2024-05-11 DIAGNOSIS — S39012A Strain of muscle, fascia and tendon of lower back, initial encounter: Secondary | ICD-10-CM | POA: Diagnosis not present

## 2024-05-11 DIAGNOSIS — R928 Other abnormal and inconclusive findings on diagnostic imaging of breast: Secondary | ICD-10-CM

## 2024-05-11 DIAGNOSIS — N62 Hypertrophy of breast: Secondary | ICD-10-CM

## 2024-05-11 DIAGNOSIS — M545 Low back pain, unspecified: Secondary | ICD-10-CM | POA: Diagnosis present

## 2024-05-11 MED ORDER — CYCLOBENZAPRINE HCL 10 MG PO TABS: 10.0000 mg | ORAL_TABLET | Freq: Three times a day (TID) | ORAL | 0 refills | Status: AC | PRN
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
## 2024-05-12 ENCOUNTER — Encounter: Payer: Self-pay | Admitting: Internal Medicine

## 2024-05-13 ENCOUNTER — Encounter: Payer: Self-pay | Admitting: Internal Medicine

## 2024-05-13 ENCOUNTER — Telehealth: Payer: Self-pay | Admitting: General Surgery

## 2024-05-13 ENCOUNTER — Encounter: Payer: Self-pay | Admitting: General Surgery

## 2024-05-13 ENCOUNTER — Ambulatory Visit (INDEPENDENT_AMBULATORY_CARE_PROVIDER_SITE_OTHER): Admitting: Internal Medicine

## 2024-05-13 ENCOUNTER — Ambulatory Visit: Payer: Self-pay | Admitting: General Surgery

## 2024-05-13 ENCOUNTER — Ambulatory Visit (INDEPENDENT_AMBULATORY_CARE_PROVIDER_SITE_OTHER): Admitting: General Surgery

## 2024-05-13 VITALS — BP 107/73 | HR 78 | Ht 71.0 in | Wt 250.0 lb

## 2024-05-13 VITALS — BP 124/78 | Ht 71.0 in | Wt 251.4 lb

## 2024-05-13 DIAGNOSIS — M545 Low back pain, unspecified: Secondary | ICD-10-CM

## 2024-05-13 DIAGNOSIS — G8929 Other chronic pain: Secondary | ICD-10-CM

## 2024-05-13 DIAGNOSIS — M546 Pain in thoracic spine: Secondary | ICD-10-CM | POA: Diagnosis not present

## 2024-05-13 DIAGNOSIS — K429 Umbilical hernia without obstruction or gangrene: Secondary | ICD-10-CM

## 2024-05-13 NOTE — Telephone Encounter (Signed)
 Patient has been advised of Pre-Admission date/time, and Surgery date at [facility].  Surgery Date: 05/24/24 Preadmission Testing Date: 05/19/24 (phone 1p-4p)  Patient informed of the scheduling process and surgery information given at time of office visit.  Patient has been made aware to call [phone], between 1-3:00pm the day before surgery, to find out what time to arrive for surgery.

## 2024-05-13 NOTE — Patient Instructions (Signed)

## 2024-05-13 NOTE — Patient Instructions (Signed)
 You have requested for your Umbilical Hernia be repaired. This will be scheduled with Dr. Cornel Diesel at [facility].   If you are on any injectable weight loss medication, you will need to stop taking your GLP-1 injectable (weight loss) medications 8 days before your surgery to avoid any complications with anesthesia.   Please see your (blue)pre-care sheet for information. Our surgery scheduler will call you to verify surgery date and to go over information.   You will need to arrange to be off work for 1-2 weeks but will have to have a lifting restriction of no more than 15 lbs for 6 weeks following your surgery. If you have FMLA or disability paperwork that needs filled out you may drop this off at our office or this can be faxed to [phone].     Umbilical Hernia, Adult A hernia is a bulge of tissue that pushes through an opening between muscles. An umbilical hernia happens in the abdomen, near the belly button (umbilicus). The hernia may contain tissues from the small intestine, large intestine, or fatty tissue covering the intestines (omentum). Umbilical hernias in adults tend to get worse over time, and they require surgical treatment. There are several types of umbilical hernias. You may have: A hernia located just above or below the umbilicus (indirect hernia). This is the most common type of umbilical hernia in adults. A hernia that forms through an opening formed by the umbilicus (direct hernia). A hernia that comes and goes (reducible hernia). A reducible hernia may be visible only when you strain, lift something heavy, or cough. This type of hernia can be pushed back into the abdomen (reduced). A hernia that traps abdominal tissue inside the hernia (incarcerated hernia). This type of hernia cannot be reduced. A hernia that cuts off blood flow to the tissues inside the hernia (strangulated hernia). The tissues can start to die if this happens. This type of hernia  requires emergency treatment.  What are the causes? An umbilical hernia happens when tissue inside the abdomen presses on a weak area of the abdominal muscles. What increases the risk? You may have a greater risk of this condition if you: Are obese. Have had several pregnancies. Have a buildup of fluid inside your abdomen (ascites). Have had surgery that weakens the abdominal muscles.  What are the signs or symptoms? The main symptom of this condition is a painless bulge at or near the belly button. A reducible hernia may be visible only when you strain, lift something heavy, or cough. Other symptoms may include: Dull pain. A feeling of pressure.  Symptoms of a strangulated hernia may include: Pain that gets increasingly worse. Nausea and vomiting. Pain when pressing on the hernia. Skin over the hernia becoming red or purple. Constipation. Blood in the stool.  How is this diagnosed? This condition may be diagnosed based on: A physical exam. You may be asked to cough or strain while standing. These actions increase the pressure inside your abdomen and force the hernia through the opening in your muscles. Your health care provider may try to reduce the hernia by pressing on it. Your symptoms and medical history.  How is this treated? Surgery is the only treatment for an umbilical hernia. Surgery for a strangulated hernia is done as soon as possible. If you have a small hernia that is not incarcerated, you may need to lose weight before having surgery. Follow these instructions at home: Lose weight, if told by your health care provider.  Do not try to push the hernia back in. Watch your hernia for any changes in color or size. Tell your health care provider if any changes occur. You may need to avoid activities that increase pressure on your hernia. Do not lift anything that is heavier than 10 lb (4.5 kg) until your health care provider says that this is safe. Take over-the-counter  and prescription medicines only as told by your health care provider. Keep all follow-up visits as told by your health care provider. This is important. Contact a health care provider if: Your hernia gets larger. Your hernia becomes painful. Get help right away if: You develop sudden, severe pain near the area of your hernia. You have pain as well as nausea or vomiting. You have pain and the skin over your hernia changes color. You develop a fever. This information is not intended to replace advice given to you by your health care provider. Make sure you discuss any questions you have with your health care provider. Document Released: 12/29/2015 Document Revised: 03/31/2016 Document Reviewed: 12/29/2015 Elsevier Interactive Patient Education  [employer].
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
## 2024-05-13 NOTE — H&P (Unsigned)
 Patrick Hahn

## 2024-05-14 ENCOUNTER — Encounter: Payer: Self-pay | Admitting: Rheumatology

## 2024-05-14 ENCOUNTER — Ambulatory Visit: Attending: Rheumatology | Admitting: Rheumatology

## 2024-05-14 VITALS — BP 123/81 | HR 102 | Temp 97.9°F | Resp 16 | Ht 72.0 in | Wt 255.2 lb

## 2024-05-14 DIAGNOSIS — F40243 Fear of flying: Secondary | ICD-10-CM

## 2024-05-14 DIAGNOSIS — R21 Rash and other nonspecific skin eruption: Secondary | ICD-10-CM

## 2024-05-14 DIAGNOSIS — L409 Psoriasis, unspecified: Secondary | ICD-10-CM

## 2024-05-14 DIAGNOSIS — G8929 Other chronic pain: Secondary | ICD-10-CM

## 2024-05-14 DIAGNOSIS — D696 Thrombocytopenia, unspecified: Secondary | ICD-10-CM | POA: Diagnosis not present

## 2024-05-14 DIAGNOSIS — M47816 Spondylosis without myelopathy or radiculopathy, lumbar region: Secondary | ICD-10-CM | POA: Diagnosis not present

## 2024-05-14 DIAGNOSIS — R7689 Other specified abnormal immunological findings in serum: Secondary | ICD-10-CM | POA: Diagnosis not present

## 2024-05-14 DIAGNOSIS — R768 Other specified abnormal immunological findings in serum: Secondary | ICD-10-CM

## 2024-05-14 DIAGNOSIS — F419 Anxiety disorder, unspecified: Secondary | ICD-10-CM

## 2024-05-14 DIAGNOSIS — D508 Other iron deficiency anemias: Secondary | ICD-10-CM

## 2024-05-14 NOTE — Patient Instructions (Addendum)
 Thoracic Strain Rehab Ask your health care provider which exercises are safe for you. Do exercises exactly as told by your provider and adjust them as directed. It is normal to feel mild stretching, pulling, tightness, or discomfort as you do these exercises. Stop right away if you feel sudden pain or your pain gets worse. Do not begin these exercises until told by your provider. Stretching and range-of-motion exercise This exercise warms up your muscles and joints and improves the movement and flexibility of your back and shoulders. This exercise also helps to relieve pain. Chest and spine stretch  Lie down on your back on a firm surface. Roll a towel or a small blanket so it is about 4 inches (10 cm) in diameter. Put the towel under the middle of your back so it is under your spine, but not under your shoulder blades. Put your hands behind your head and let your elbows fall to your sides. This will increase your stretch. Take a deep breath (inhale). Hold for __________ seconds. Relax after you breathe out (exhale). Repeat __________ times. Complete this exercise __________ times a day. Strengthening exercises These exercises build strength and endurance in your back and your shoulder blade muscles. Endurance is the ability to use your muscles for a long time, even after they get tired. Alternating arm and leg raises  Get on your hands and knees on a firm surface. If you are on a hard floor, you may want to use padding, such as an exercise mat, to cushion your knees. Line up your arms and legs. Your hands should be directly below your shoulders, and your knees should be directly below your hips. Lift your left leg behind you. At the same time, raise your right arm and straighten it in front of you. Do not lift your leg higher than your hip. Do not lift your arm higher than your shoulder. Keep your abdominal and back muscles tight. Keep your hips facing the ground. Do not arch your  back. Carefully stay balanced. Do not hold your breath. Hold for __________ seconds. Slowly return to the starting position and repeat with your right leg and your left arm. Repeat __________ times. Complete this exercise __________ times a day. Straight arm rows This exercise is also called the shoulder extension exercise. Stand with your feet shoulder width apart. Secure an exercise band to a stable object in front of you so the band is at or above shoulder height. Hold one end of the exercise band in each hand. Straighten your elbows and lift your hands up to shoulder height. Step back, away from the secured end of the exercise band, until the band stretches. Squeeze your shoulder blades together and pull your hands down to the sides of your thighs. Stop when your hands are straight down by your sides. This is shoulder extension. Do not let your hands go behind your body. Hold for __________ seconds. Slowly return to the starting position. Repeat __________ times. Complete this exercise __________ times a day. Rowing scapular retraction This is an exercise in which the shoulder blades (scapulae) are pulled toward each other (retraction). Sit in a stable chair without armrests, or stand up. Secure an exercise band to a stable object in front of you so the band is at shoulder height. Hold one end of the exercise band in each hand. Your palms should face toward each other. Bring your arms out straight in front of you. Step back, away from the secured end of the  exercise band, until the band stretches. Pull the band backward. As you do this, bend your elbows and squeeze your shoulder blades together, but avoid letting the rest of your body move. Do not shrug your shoulders upward while you do this. Stop when your elbows are at your sides or slightly behind your body. Hold for __________ seconds. Slowly straighten your arms to return to the starting position. Repeat __________ times.  Complete this exercise __________ times a day. Posture and body mechanics Good posture and healthy body mechanics can help to relieve stress in your body's tissues and joints. Body mechanics refers to the movements and positions of your body while you do your daily activities. Posture is part of body mechanics. Good posture means: Your spine is in its natural S-curve position (neutral). Your shoulders are pulled back slightly. Your head is not tipped forward. Follow these guidelines to improve your posture and body mechanics in your everyday activities. Standing  When standing, keep your spine neutral and your feet about hip width apart. Keep a slight bend in your knees. Your ears, shoulders, and hips should line up with each other. When you do a task in which you lean forward while standing in one place for a long time, place one foot up on a stable object that is 2-4 inches (5-10 cm) high, such as a footstool. This helps keep your spine neutral. Sitting  When sitting, keep your spine neutral and keep your feet flat on the floor. Use a footrest if needed. Keep your thighs parallel to the floor. Avoid rounding your shoulders, and avoid tilting your head forward. When working at a desk or a computer, keep your desk at a height where your hands are slightly lower than your elbows. Slide your chair under your desk so you are close enough to maintain good posture. When working at a computer, place your monitor at a height where you are looking straight ahead and you do not have to tilt your head forward or downward to look at the screen. Resting When lying down and resting, avoid positions that are most painful for you. If you have pain with activities such as sitting, bending, stooping, or squatting (flexion-basedactivities), lie in a position in which your body does not bend very much. For example, avoid curling up on your side with your arms and knees near your chest (fetal position). If you have  pain with activities such as standing for a long time or reaching with your arms (extension-basedactivities), lie with your spine in a neutral position and bend your knees slightly. Try the following positions: Lie on your side with a pillow between your knees. Lie on your back with a pillow under your knees.  Lifting  When lifting objects, keep your feet at least shoulder width apart and tighten your abdominal muscles. Bend your knees and hips and keep your spine neutral. It is important to lift using the strength of your legs, not your back. Do not lock your knees straight out. Always ask for help to lift heavy or awkward objects. This information is not intended to replace advice given to you by your health care provider. Make sure you discuss any questions you have with your health care provider. Document Revised: 01/10/2023 Document Reviewed: 03/18/2022 Elsevier Patient Education  2024 [employer]  Low Back Sprain or Strain Rehab Ask your health care provider which exercises are safe for you. Do exercises exactly as told by your health care provider and adjust them as directed.  It is normal to feel mild stretching, pulling, tightness, or discomfort as you do these exercises. Stop right away if you feel sudden pain or your pain gets worse. Do not begin these exercises until told by your health care provider. Stretching and range-of-motion exercises These exercises warm up your muscles and joints and improve the movement and flexibility of your back. These exercises also help to relieve pain, numbness, and tingling. Lumbar rotation  Lie on your back on a firm bed or the floor with your knees bent. Straighten your arms out to your sides so each arm forms a 90-degree angle (right angle) with a side of your body. Slowly move (rotate) both of your knees to one side of your body until you feel a stretch in your lower back (lumbar). Try not to let your shoulders lift off the floor. Hold this  position for __________ seconds. Tense your abdominal muscles and slowly move your knees back to the starting position. Repeat this exercise on the other side of your body. Repeat __________ times. Complete this exercise __________ times a day. Single knee to chest  Lie on your back on a firm bed or the floor with both legs straight. Bend one of your knees. Use your hands to move your knee up toward your chest until you feel a gentle stretch in your lower back and buttock. Hold your leg in this position by holding on to the front of your knee. Keep your other leg as straight as possible. Hold this position for __________ seconds. Slowly return to the starting position. Repeat with your other leg. Repeat __________ times. Complete this exercise __________ times a day. Prone extension on elbows  Lie on your abdomen on a firm bed or the floor (prone position). Prop yourself up on your elbows. Use your arms to help lift your chest up until you feel a gentle stretch in your abdomen and your lower back. This will place some of your body weight on your elbows. If this is uncomfortable, try stacking pillows under your chest. Your hips should stay down, against the surface that you are lying on. Keep your hip and back muscles relaxed. Hold this position for __________ seconds. Slowly relax your upper body and return to the starting position. Repeat __________ times. Complete this exercise __________ times a day. Strengthening exercises These exercises build strength and endurance in your back. Endurance is the ability to use your muscles for a long time, even after they get tired. Pelvic tilt This exercise strengthens the muscles that lie deep in the abdomen. Lie on your back on a firm bed or the floor with your legs extended. Bend your knees so they are pointing toward the ceiling and your feet are flat on the floor. Tighten your lower abdominal muscles to press your lower back against the  floor. This motion will tilt your pelvis so your tailbone points up toward the ceiling instead of pointing to your feet or the floor. To help with this exercise, you may place a small towel under your lower back and try to push your back into the towel. Hold this position for __________ seconds. Let your muscles relax completely before you repeat this exercise. Repeat __________ times. Complete this exercise __________ times a day. Alternating arm and leg raises  Get on your hands and knees on a firm surface. If you are on a hard floor, you may want to use padding, such as an exercise mat, to cushion your knees. Line up your  arms and legs. Your hands should be directly below your shoulders, and your knees should be directly below your hips. Lift your left leg behind you. At the same time, raise your right arm and straighten it in front of you. Do not lift your leg higher than your hip. Do not lift your arm higher than your shoulder. Keep your abdominal and back muscles tight. Keep your hips facing the ground. Do not arch your back. Keep your balance carefully, and do not hold your breath. Hold this position for __________ seconds. Slowly return to the starting position. Repeat with your right leg and your left arm. Repeat __________ times. Complete this exercise __________ times a day. Abdominal set with straight leg raise  Lie on your back on a firm bed or the floor. Bend one of your knees and keep your other leg straight. Tense your abdominal muscles and lift your straight leg up, 4-6 inches (10-15 cm) off the ground. Keep your abdominal muscles tight and hold this position for __________ seconds. Do not hold your breath. Do not arch your back. Keep it flat against the ground. Keep your abdominal muscles tense as you slowly lower your leg back to the starting position. Repeat with your other leg. Repeat __________ times. Complete this exercise __________ times a day. Single leg lower  with bent knees Lie on your back on a firm bed or the floor. Tense your abdominal muscles and lift your feet off the floor, one foot at a time, so your knees and hips are bent in 90-degree angles (right angles). Your knees should be over your hips and your lower legs should be parallel to the floor. Keeping your abdominal muscles tense and your knee bent, slowly lower one of your legs so your toe touches the ground. Lift your leg back up to return to the starting position. Do not hold your breath. Do not let your back arch. Keep your back flat against the ground. Repeat with your other leg. Repeat __________ times. Complete this exercise __________ times a day. Posture and body mechanics Good posture and healthy body mechanics can help to relieve stress in your body's tissues and joints. Body mechanics refers to the movements and positions of your body while you do your daily activities. Posture is part of body mechanics. Good posture means: Your spine is in its natural S-curve position (neutral). Your shoulders are pulled back slightly. Your head is not tipped forward (neutral). Follow these guidelines to improve your posture and body mechanics in your everyday activities. Standing  When standing, keep your spine neutral and your feet about hip-width apart. Keep a slight bend in your knees. Your ears, shoulders, and hips should line up. When you do a task in which you stand in one place for a long time, place one foot up on a stable object that is 2-4 inches (5-10 cm) high, such as a footstool. This helps keep your spine neutral. Sitting  When sitting, keep your spine neutral and keep your feet flat on the floor. Use a footrest, if necessary, and keep your thighs parallel to the floor. Avoid rounding your shoulders, and avoid tilting your head forward. When working at a desk or a computer, keep your desk at a height where your hands are slightly lower than your elbows. Slide your chair under  your desk so you are close enough to maintain good posture. When working at a computer, place your monitor at a height where you are looking straight ahead and  you do not have to tilt your head forward or downward to look at the screen. Resting When lying down and resting, avoid positions that are most painful for you. If you have pain with activities such as sitting, bending, stooping, or squatting, lie in a position in which your body does not bend very much. For example, avoid curling up on your side with your arms and knees near your chest (fetal position). If you have pain with activities such as standing for a long time or reaching with your arms, lie with your spine in a neutral position and bend your knees slightly. Try the following positions: Lying on your side with a pillow between your knees. Lying on your back with a pillow under your knees. Lifting  When lifting objects, keep your feet at least shoulder-width apart and tighten your abdominal muscles. Bend your knees and hips and keep your spine neutral. It is important to lift using the strength of your legs, not your back. Do not lock your knees straight out. Always ask for help to lift heavy or awkward objects. This information is not intended to replace advice given to you by your health care provider. Make sure you discuss any questions you have with your health care provider. Document Revised: 12/02/2022 Document Reviewed: 10/16/2020 Elsevier Patient Education  2024 [employer].

## 2024-05-16 ENCOUNTER — Ambulatory Visit: Payer: Self-pay | Admitting: Rheumatology

## 2024-05-16 NOTE — Progress Notes (Signed)
 ANA remains positive, dsDNA is indeterminate.  All other autoimmune antibodies are negative except 3 more tests are pending.  Will discuss results at the follow-up visit.

## 2024-05-18 ENCOUNTER — Encounter: Payer: Self-pay | Admitting: Internal Medicine

## 2024-05-18 NOTE — Telephone Encounter (Signed)
 Ok for work note?

## 2024-05-19 ENCOUNTER — Other Ambulatory Visit: Payer: Self-pay

## 2024-05-19 ENCOUNTER — Encounter
Admission: RE | Admit: 2024-05-19 | Discharge: 2024-05-19 | Disposition: A | Discharge status: Home / Self Care | Source: Ambulatory Visit | Attending: General Surgery | Admitting: General Surgery

## 2024-05-19 LAB — ANTI-NUCLEAR AB-TITER (ANA TITER): ANA Titer 1: 1:1280 {titer} — ABNORMAL HIGH

## 2024-05-19 LAB — ANA: Anti Nuclear Antibody (ANA): POSITIVE — AB

## 2024-05-19 LAB — PROTEIN / CREATININE RATIO, URINE
Creatinine, Urine: 287 mg/dL (ref 20–320)
Protein/Creat Ratio: 84 mg/g{creat} (ref 25–148)
Protein/Creatinine Ratio: 0.084 mg/mg{creat} (ref 0.025–0.148)
Total Protein, Urine: 24 mg/dL (ref 5–25)

## 2024-05-19 LAB — SJOGRENS SYNDROME-B EXTRACTABLE NUCLEAR ANTIBODY: SSB (La) (ENA) Antibody, IgG: <1 AI

## 2024-05-19 LAB — LUPUS ANTICOAGULANT EVAL W/ REFLEX
PTT-LA Screen: 37 s (ref <=40)
dRVVT: 36 s (ref <=45)

## 2024-05-19 LAB — PHOSPHATIDYLSERINE/PROTHROMBIN (PS/PT) ANTIBODIES (IGG, IGM)
Phosphatidylserine/Prothrombin Ab (IgG): 23 U (ref <=30)
Phosphatidylserine/Prothrombin Ab (IgM): 16 U (ref <=30)

## 2024-05-19 LAB — SJOGRENS SYNDROME-A EXTRACTABLE NUCLEAR ANTIBODY: SSA (Ro) (ENA) Antibody, IgG: <1 AI

## 2024-05-19 LAB — RNP ANTIBODY: Ribonucleic Protein(ENA) Antibody, IgG: <1 AI

## 2024-05-19 LAB — ANTI-SMITH ANTIBODY: ENA SM Ab Ser-aCnc: <1 AI

## 2024-05-19 LAB — ANTI-DNA ANTIBODY, DOUBLE-STRANDED: ds DNA Ab: 5 [IU]/mL — ABNORMAL HIGH

## 2024-05-19 NOTE — Patient Instructions (Addendum)
 Your procedure is scheduled on:  MONDAY OCTOBER 13  Report to the Registration Desk on the 1st floor of the [facility] To find out your arrival time, please call [phone] between 1PM - 3PM on:   FRIDAY OCTOBER 10  If your arrival time is 6:00 am, do not arrive before that time as the [facility] entrance doors do not open until 6:00 am.  REMEMBER: Instructions that are not followed completely may result in serious medical risk, up to and including death; or upon the discretion of your surgeon and anesthesiologist your surgery may need to be rescheduled.  Do not eat food after midnight the night before surgery.  No gum chewing or hard candies.  You may however, drink CLEAR liquids up to 2 hours before you are scheduled to arrive for your surgery. Do not drink anything within 2 hours of your scheduled arrival time.  Clear liquids include: - water  - apple juice without pulp - gatorade (not RED colors) - black coffee or tea (Do NOT add milk or creamers to the coffee or tea) Do NOT drink anything that is not on this list.   One week prior to surgery: Stop Anti-inflammatories (NSAIDS) such as Advil , Aleve, Ibuprofen , Motrin , Naproxen, Naprosyn and Aspirin based products such as Excedrin, Goody's Powder, BC Powder. Stop ANY OVER THE COUNTER supplements until after surgery.  You may however, continue to take Tylenol if needed for pain up until the day of surgery.  Continue taking all of your other prescription medications up until the day of surgery.  ON THE DAY OF SURGERY DO NOT TAKE ANY MEDICATION  No Alcohol for 24 hours before or after surgery.  Do not use any recreational drugs for at least a week (preferably 2 weeks) before your surgery.  Please be advised that the combination of cocaine and anesthesia may have negative outcomes, up to and including death. If you test positive for cocaine, your surgery will be cancelled.  On the morning of surgery brush your teeth with  toothpaste and water, you may rinse your mouth with mouthwash if you wish. Do not swallow any toothpaste or mouthwash.  Use CHG Soap as directed on instruction sheet.  Do not wear jewelry, make-up, hairpins, clips or nail polish.  For welded (permanent) jewelry: bracelets, anklets, waist bands, etc.  Please have this removed prior to surgery.  If it is not removed, there is a chance that hospital personnel will need to cut it off on the day of surgery.  Do not wear lotions, powders, or perfumes.   Do not shave body hair from the neck down 48 hours before surgery.  Do not bring valuables to the hospital. [facility] is not responsible for any missing/lost belongings or valuables.   Notify your doctor if there is any change in your medical condition (cold, fever, infection).  Wear comfortable clothing (specific to your surgery type) to the hospital.  After surgery, you can help prevent lung complications by doing breathing exercises.  Take deep breaths and cough every 1-2 hours.  When coughing or sneezing, hold a pillow firmly against your incision with both hands. This is called "splinting." Doing this helps protect your incision. It also decreases belly discomfort.  If you are being discharged the day of surgery, you will not be allowed to drive home. You will need a responsible individual to drive you home and stay with you for 24 hours after surgery.   If you are taking public transportation, you  will need to have a responsible individual with you.  Please call the Pre-admissions Testing Dept. at [phone] if you have any questions about these instructions.  Surgery Visitation Policy:  Patients having surgery or a procedure may have two visitors.  Children under the age of 78 must have an adult with them who is not the patient.  Merchandiser, retail to address health-related social needs:  [email or web address]                                                                                                                 Preparing for Surgery with CHLORHEXIDINE GLUCONATE (CHG) Soap  Chlorhexidine Gluconate (CHG) Soap  o An antiseptic cleaner that kills germs and bonds with the skin to continue killing germs even after washing  o Used for showering the night before surgery and morning of surgery  Before surgery, you can play an important role by reducing the number of germs on your skin.  CHG (Chlorhexidine gluconate) soap is an antiseptic cleanser which kills germs and bonds with the skin to continue killing germs even after washing.  Please do not use if you have an allergy to CHG or antibacterial soaps. If your skin becomes reddened/irritated stop using the CHG.  1. Shower the NIGHT BEFORE SURGERY with CHG soap.  2. If you choose to wash your hair, wash your hair first as usual with your normal shampoo.  3. After shampooing, rinse your hair and body thoroughly to remove the shampoo.  4. Use CHG as you would any other liquid soap. You can apply CHG directly to the skin and wash gently with a clean washcloth.  5. Apply the CHG soap to your body only from the neck down. Do not use on open wounds or open sores. Avoid contact with your eyes, ears, mouth, and genitals (private parts). Wash face and genitals (private parts) with your normal soap.  6. Wash thoroughly, paying special attention to the area where your surgery will be performed.  7. Thoroughly rinse your body with warm water.  8. Do not shower/wash with your normal soap after using and rinsing off the CHG soap.  9. Do not use lotions, oils, etc., after showering with CHG.  10. Pat yourself dry with a clean towel.  11. Wear clean pajamas to bed the night before surgery.  12. Place clean sheets on your bed the night of your shower and do not sleep with pets.  13. Do not apply any deodorants/lotions/powders.  14. Please wear clean clothes to the hospital.  15.  Remember to brush your teeth with your regular toothpaste.

## 2024-05-19 NOTE — Progress Notes (Signed)
 ANA remains positive double-stranded DNA is indeterminate.  All other labs are within normal limits.  I will discuss results at the follow-up visit.

## 2024-05-20 NOTE — Progress Notes (Signed)
 ANA is positive, double-stranded DNA is indeterminate.  All other labs are within normal limits.  I will discuss results at the follow-up visit.

## 2024-05-21 ENCOUNTER — Ambulatory Visit
Admission: RE | Admit: 2024-05-21 | Discharge: 2024-05-21 | Disposition: A | Discharge status: Home / Self Care | Source: Ambulatory Visit | Attending: Internal Medicine | Admitting: Internal Medicine

## 2024-05-21 ENCOUNTER — Other Ambulatory Visit: Payer: Self-pay | Admitting: Internal Medicine

## 2024-05-21 DIAGNOSIS — N62 Hypertrophy of breast: Secondary | ICD-10-CM | POA: Insufficient documentation

## 2024-05-21 DIAGNOSIS — R928 Other abnormal and inconclusive findings on diagnostic imaging of breast: Secondary | ICD-10-CM

## 2024-05-24 ENCOUNTER — Ambulatory Visit
Admission: RE | Admit: 2024-05-24 | Discharge: 2024-05-24 | Disposition: A | Discharge status: Home / Self Care | Attending: General Surgery | Admitting: General Surgery

## 2024-05-24 ENCOUNTER — Encounter: Payer: Self-pay | Admitting: General Surgery

## 2024-05-24 ENCOUNTER — Ambulatory Visit: Admitting: Certified Registered"

## 2024-05-24 ENCOUNTER — Other Ambulatory Visit: Payer: Self-pay

## 2024-05-24 ENCOUNTER — Encounter
Admission: RE | Disposition: A | Discharge status: Home / Self Care | Payer: Self-pay | Source: Home / Self Care | Attending: General Surgery

## 2024-05-24 DIAGNOSIS — F419 Anxiety disorder, unspecified: Secondary | ICD-10-CM | POA: Insufficient documentation

## 2024-05-24 DIAGNOSIS — K429 Umbilical hernia without obstruction or gangrene: Secondary | ICD-10-CM | POA: Diagnosis not present

## 2024-05-24 HISTORY — PX: UMBILICAL HERNIA REPAIR: SHX196

## 2024-05-24 SURGERY — REPAIR, HERNIA, UMBILICAL, ADULT
Anesthesia: General

## 2024-05-24 MED ORDER — ACETAMINOPHEN 10 MG/ML IV SOLN
INTRAVENOUS | Status: AC
Start: 2024-05-24 — End: 2024-05-24
  Filled 2024-05-24: qty 100

## 2024-05-24 MED ORDER — LIDOCAINE HCL (PF) 2 % IJ SOLN
INTRAMUSCULAR | Status: AC
  Filled 2024-05-24: qty 5

## 2024-05-24 MED ORDER — LIDOCAINE HCL (CARDIAC) PF 100 MG/5ML IV SOSY
PREFILLED_SYRINGE | INTRAVENOUS | Status: DC | PRN
  Administered 2024-05-24: 100 mg via INTRAVENOUS

## 2024-05-24 MED ORDER — ONDANSETRON HCL 4 MG/2ML IJ SOLN
INTRAMUSCULAR | Status: DC | PRN
  Administered 2024-05-24: 4 mg via INTRAVENOUS

## 2024-05-24 MED ORDER — ORAL CARE MOUTH RINSE: 15.0000 mL | Freq: Once | OROMUCOSAL | Status: AC

## 2024-05-24 MED ORDER — CHLORHEXIDINE GLUCONATE CLOTH 2 % EX PADS: 6.0000 | MEDICATED_PAD | Freq: Once | CUTANEOUS | Status: DC

## 2024-05-24 MED ORDER — PROPOFOL 10 MG/ML IV BOLUS
INTRAVENOUS | Status: AC
  Filled 2024-05-24: qty 20

## 2024-05-24 MED ORDER — MIDAZOLAM HCL 2 MG/2ML IJ SOLN
INTRAMUSCULAR | Status: AC
  Filled 2024-05-24: qty 2

## 2024-05-24 MED ORDER — LACTATED RINGERS IV SOLN: INTRAVENOUS | Status: DC

## 2024-05-24 MED ORDER — ROCURONIUM BROMIDE 100 MG/10ML IV SOLN
INTRAVENOUS | Status: DC | PRN
  Administered 2024-05-24: 70 mg via INTRAVENOUS

## 2024-05-24 MED ORDER — ROCURONIUM BROMIDE 10 MG/ML (PF) SYRINGE
PREFILLED_SYRINGE | INTRAVENOUS | Status: AC
  Filled 2024-05-24: qty 10

## 2024-05-24 MED ORDER — GLYCOPYRROLATE 0.2 MG/ML IJ SOLN
INTRAMUSCULAR | Status: AC
  Filled 2024-05-24: qty 1

## 2024-05-24 MED ORDER — ACETAMINOPHEN 10 MG/ML IV SOLN
INTRAVENOUS | Status: DC | PRN
  Administered 2024-05-24: 1000 mg via INTRAVENOUS

## 2024-05-24 MED ORDER — OXYCODONE HCL 5 MG PO TABS
ORAL_TABLET | ORAL | Status: AC
  Filled 2024-05-24: qty 1

## 2024-05-24 MED ORDER — FENTANYL CITRATE (PF) 100 MCG/2ML IJ SOLN
INTRAMUSCULAR | Status: DC | PRN
  Administered 2024-05-24 (×2): 50 ug via INTRAVENOUS
  Administered 2024-05-24: 25 ug via INTRAVENOUS

## 2024-05-24 MED ORDER — GLYCOPYRROLATE 0.2 MG/ML IJ SOLN
INTRAMUSCULAR | Status: DC | PRN
  Administered 2024-05-24: .1 mg via INTRAVENOUS

## 2024-05-24 MED ORDER — SUGAMMADEX SODIUM 200 MG/2ML IV SOLN
INTRAVENOUS | Status: DC | PRN
  Administered 2024-05-24: 250 mg via INTRAVENOUS

## 2024-05-24 MED ORDER — MIDAZOLAM HCL 2 MG/2ML IJ SOLN
INTRAMUSCULAR | Status: DC | PRN
Start: 2024-05-24 — End: 2024-05-24
  Administered 2024-05-24: 2 mg via INTRAVENOUS

## 2024-05-24 MED ORDER — DROPERIDOL 2.5 MG/ML IJ SOLN: 0.6250 mg | Freq: Once | INTRAMUSCULAR | Status: DC | PRN

## 2024-05-24 MED ORDER — CHLORHEXIDINE GLUCONATE 0.12 % MT SOLN
OROMUCOSAL | Status: AC
  Filled 2024-05-24: qty 15

## 2024-05-24 MED ORDER — CEFAZOLIN SODIUM-DEXTROSE 2-4 GM/100ML-% IV SOLN
INTRAVENOUS | Status: AC
  Filled 2024-05-24: qty 100

## 2024-05-24 MED ORDER — BUPIVACAINE-EPINEPHRINE 0.5% -1:200000 IJ SOLN
INTRAMUSCULAR | Status: DC | PRN
  Administered 2024-05-24: 30 mL via INTRAMUSCULAR

## 2024-05-24 MED ORDER — DEXMEDETOMIDINE HCL IN NACL 80 MCG/20ML IV SOLN
INTRAVENOUS | Status: AC
  Filled 2024-05-24: qty 20

## 2024-05-24 MED ORDER — PROPOFOL 10 MG/ML IV BOLUS
INTRAVENOUS | Status: DC | PRN
Start: 2024-05-24 — End: 2024-05-24
  Administered 2024-05-24: 200 mg via INTRAVENOUS

## 2024-05-24 MED ORDER — BUPIVACAINE-EPINEPHRINE (PF) 0.5% -1:200000 IJ SOLN
INTRAMUSCULAR | Status: AC
  Filled 2024-05-24: qty 30

## 2024-05-24 MED ORDER — OXYCODONE HCL 5 MG PO TABS: 5.0000 mg | ORAL_TABLET | Freq: Four times a day (QID) | ORAL | 0 refills | Status: DC | PRN

## 2024-05-24 MED ORDER — FENTANYL CITRATE (PF) 100 MCG/2ML IJ SOLN
INTRAMUSCULAR | Status: AC
  Filled 2024-05-24: qty 2

## 2024-05-24 MED ORDER — DEXAMETHASONE SOD PHOSPHATE PF 10 MG/ML IJ SOLN
INTRAMUSCULAR | Status: DC | PRN
  Administered 2024-05-24: 10 mg via INTRAVENOUS

## 2024-05-24 MED ORDER — OXYCODONE HCL 5 MG PO TABS
5.0000 mg | ORAL_TABLET | Freq: Once | ORAL | Status: AC
  Administered 2024-05-24: 5 mg via ORAL

## 2024-05-24 MED ORDER — ONDANSETRON HCL 4 MG/2ML IJ SOLN
INTRAMUSCULAR | Status: AC
  Filled 2024-05-24: qty 2

## 2024-05-24 MED ORDER — CEFAZOLIN SODIUM-DEXTROSE 2-4 GM/100ML-% IV SOLN
2.0000 g | INTRAVENOUS | Status: AC
  Administered 2024-05-24: 2 g via INTRAVENOUS

## 2024-05-24 MED ORDER — FENTANYL CITRATE (PF) 100 MCG/2ML IJ SOLN: 25.0000 ug | INTRAMUSCULAR | Status: DC | PRN

## 2024-05-24 MED ORDER — DEXMEDETOMIDINE HCL IN NACL 80 MCG/20ML IV SOLN
INTRAVENOUS | Status: DC | PRN
Start: 2024-05-24 — End: 2024-05-24
  Administered 2024-05-24: 8 ug via INTRAVENOUS

## 2024-05-24 MED ORDER — CHLORHEXIDINE GLUCONATE 0.12 % MT SOLN
15.0000 mL | Freq: Once | OROMUCOSAL | Status: AC
  Administered 2024-05-24: 15 mL via OROMUCOSAL

## 2024-05-24 SURGICAL SUPPLY — 29 items
BLADE SURG 15 STRL LF DISP TIS (BLADE) ×1 IMPLANT
BLADE SURG SZ10 CARB STEEL (BLADE) IMPLANT
CHLORAPREP W/TINT 26 (MISCELLANEOUS) IMPLANT
DERMABOND ADVANCED .7 DNX12 (GAUZE/BANDAGES/DRESSINGS) IMPLANT
DRAPE LAPAROTOMY 100X77 ABD (DRAPES) ×1 IMPLANT
DRSG OPSITE POSTOP 4X10 (GAUZE/BANDAGES/DRESSINGS) IMPLANT
DRSG OPSITE POSTOP 4X8 (GAUZE/BANDAGES/DRESSINGS) IMPLANT
ELECT BLADE 6.5 EXT (BLADE) IMPLANT
ELECTRODE REM PT RTRN 9FT ADLT (ELECTROSURGICAL) ×1 IMPLANT
GLOVE BIOGEL PI IND STRL 7.5 (GLOVE) ×1 IMPLANT
GLOVE SURG SYN 7.0 PF PI (GLOVE) ×1 IMPLANT
GOWN STRL REUS W/ TWL LRG LVL3 (GOWN DISPOSABLE) ×2 IMPLANT
KIT TURNOVER KIT A (KITS) ×1 IMPLANT
LABEL OR SOLS (LABEL) ×1 IMPLANT
MANIFOLD NEPTUNE II (INSTRUMENTS) ×1 IMPLANT
NDL HYPO 22X1.5 SAFETY MO (MISCELLANEOUS) ×1 IMPLANT
NEEDLE HYPO 22X1.5 SAFETY MO (MISCELLANEOUS) ×1 IMPLANT
NS IRRIG 500ML POUR BTL (IV SOLUTION) ×1 IMPLANT
PACK BASIN MINOR ARMC (MISCELLANEOUS) ×1 IMPLANT
SPONGE T-LAP 18X18 ~~LOC~~+RFID (SPONGE) ×1 IMPLANT
STAPLER SKIN PROX 35W (STAPLE) IMPLANT
SUT ETHIBOND 0 MO6 C/R (SUTURE) ×1 IMPLANT
SUT PDS PLUS AB 0 CT-2 (SUTURE) IMPLANT
SUT VIC AB 3-0 SH 27X BRD (SUTURE) ×1 IMPLANT
SUTURE MNCRL 4-0 27XMF (SUTURE) ×1 IMPLANT
SYR 10ML LL (SYRINGE) ×1 IMPLANT
SYR 20ML LL LF (SYRINGE) IMPLANT
TRAP FLUID SMOKE EVACUATOR (MISCELLANEOUS) ×1 IMPLANT
WATER STERILE IRR 500ML POUR (IV SOLUTION) ×1 IMPLANT

## 2024-05-24 NOTE — Anesthesia Preprocedure Evaluation (Signed)
 Anesthesia Evaluation  Patient identified by MRN, date of birth, ID band Patient awake    Reviewed: Allergy & Precautions, H&P , NPO status , Patient's Chart, lab work & pertinent test results, reviewed documented beta blocker date and time   History of Anesthesia Complications Negative for: history of anesthetic complications  Airway Mallampati: II  TM Distance: >3 FB Neck ROM: full    Dental  (+) Dental Advidsory Given, Teeth Intact   Pulmonary neg shortness of breath, sleep apnea (per patient's wife, not diagnosed) , neg COPD, neg recent URI   Pulmonary exam normal breath sounds clear to auscultation       Cardiovascular Exercise Tolerance: Good negative cardio ROS Normal cardiovascular exam Rhythm:regular Rate:Normal     Neuro/Psych  PSYCHIATRIC DISORDERS Anxiety     negative neurological ROS     GI/Hepatic negative GI ROS, Neg liver ROS,,,  Endo/Other  negative endocrine ROS    Renal/GU negative Renal ROS  negative genitourinary   Musculoskeletal   Abdominal   Peds  Hematology negative hematology ROS (+)   Anesthesia Other Findings Past Medical History: No date: Anxiety No date: Chronic back pain No date: IDA (iron deficiency anemia) No date: Psoriasis No date: Thrombocytopenia   Reproductive/Obstetrics negative OB ROS                              Anesthesia Physical Anesthesia Plan  ASA: 2  Anesthesia Plan: General   Post-op Pain Management:    Induction: Intravenous  PONV Risk Score and Plan: 2  Airway Management Planned: Oral ETT  Additional Equipment:   Intra-op Plan:   Post-operative Plan: Extubation in OR  Informed Consent: I have reviewed the patients History and Physical, chart, labs and discussed the procedure including the risks, benefits and alternatives for the proposed anesthesia with the patient or authorized representative who has indicated his/her  understanding and acceptance.     Dental Advisory Given  Plan Discussed with: Anesthesiologist, CRNA and Surgeon  Anesthesia Plan Comments:         Anesthesia Quick Evaluation

## 2024-05-24 NOTE — Transfer of Care (Signed)
 Immediate Anesthesia Transfer of Care Note  Patient: Patrick Hahn  Procedure(s) Performed: REPAIR, HERNIA, UMBILICAL, ADULT  Patient Location: PACU  Anesthesia Type:General  Level of Consciousness: drowsy  Airway & Oxygen Therapy: Patient Spontanous Breathing and Patient connected to face mask oxygen  Post-op Assessment: Report given to RN and Post -op Vital signs reviewed and stable  Post vital signs: Reviewed and stable  Last Vitals:  Vitals Value Taken Time  BP 132/85 05/24/24 10:42  Temp 35.9 1042  Pulse 95 05/24/24 10:44  Resp 19 05/24/24 10:44  SpO2 99 % 05/24/24 10:44  Vitals shown include unfiled device data.  Last Pain:  Vitals:   05/24/24 0844  TempSrc: Temporal  PainSc: 0-No pain         Complications: No notable events documented.

## 2024-05-24 NOTE — Anesthesia Procedure Notes (Signed)
 Procedure Name: Intubation Date/Time: 05/24/2024 9:47 AM  Performed by: Jackye Spanner, CRNAPre-anesthesia Checklist: Patient identified, Patient being monitored, Timeout performed, Emergency Drugs available and Suction available Patient Re-evaluated:Patient Re-evaluated prior to induction Oxygen Delivery Method: Circle system utilized Preoxygenation: Pre-oxygenation with 100% oxygen Induction Type: IV induction Ventilation: Mask ventilation without difficulty, Two handed mask ventilation required and Oral airway inserted - appropriate to patient size Laryngoscope Size: 3 and McGrath Grade View: Grade I Tube type: Oral Tube size: 7.5 mm Number of attempts: 1 Airway Equipment and Method: Stylet Placement Confirmation: ETT inserted through vocal cords under direct vision, positive ETCO2 and breath sounds checked- equal and bilateral Secured at: 22 cm Tube secured with: Tape Dental Injury: Teeth and Oropharynx as per pre-operative assessment  Comments: Smooth atraumatic intubation, no complications noted.

## 2024-05-24 NOTE — Op Note (Signed)
 Operative note  Preoperative diagnosis: Umbilical hernia Postoperative diagnosis: Umbilical hernia Surgeon: Jayson Hobby, MD EBL: 3 cc Procedure: Open umbilical hernia repair  After informed consent was obtained the patient was brought to the operating room placed supine on the operating room table.  General endotracheal anesthesia was then induced and his abdomen was then prepped and draped in usual sterile fashion.  A surgical timeout was called identifying correct patient, site, side and procedure.  The infraumbilical skin was infiltrated with 30 cc of half percent lidocaine with epinephrine.  A infraumbilical curvilinear incision was made and taken down through the subcutaneous tissue with Bovie cautery.  The umbilical stalk was dissected circumferentially.  The plane between the umbilical stalk skin and the fascia was then separated with Bovie cautery.  This exposed a very small umbilical hernia defect.  The defect measured approximately 1 cm.  The fascial edges were then cleared off circumferentially.  It was closed primarily with a series of 0 Ethibond sutures.  The umbilical stalk was then tacked down to the fascia with a 2-0 Vicryl.  The subcutaneous tissue was then closed with 3-0 Vicryl.  The skin was closed with 4-0 Monocryl and dressed with glue.  Prior to termination of the procedure all sponge and instrument counts were correct x 2.  The patient was then taken to the PACU in good condition.
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
## 2024-05-25 ENCOUNTER — Encounter: Payer: Self-pay | Admitting: General Surgery

## 2024-05-29 NOTE — Anesthesia Postprocedure Evaluation (Signed)
 Anesthesia Post Note  Patient: Patrick Hahn  Procedure(s) Performed: REPAIR, HERNIA, UMBILICAL, ADULT  Patient location during evaluation: PACU Anesthesia Type: General Level of consciousness: awake and alert Pain management: pain level controlled Vital Signs Assessment: post-procedure vital signs reviewed and stable Respiratory status: spontaneous breathing, nonlabored ventilation, respiratory function stable and patient connected to nasal cannula oxygen Cardiovascular status: blood pressure returned to baseline and stable Postop Assessment: no apparent nausea or vomiting Anesthetic complications: no   No notable events documented.   Last Vitals:  Vitals:   05/24/24 1129 05/24/24 1142  BP: 107/78 108/72  Pulse: 97 79  Resp: (!) 23 18  Temp: (!) 36.3 C 36.5 C  SpO2: 95% 97%    Last Pain:  Vitals:   05/24/24 1142  TempSrc: Temporal  PainSc: 2                  Prentice Murphy

## 2024-05-31 ENCOUNTER — Telehealth (INDEPENDENT_AMBULATORY_CARE_PROVIDER_SITE_OTHER): Admitting: Internal Medicine

## 2024-05-31 ENCOUNTER — Telehealth: Payer: Self-pay | Admitting: General Surgery

## 2024-05-31 DIAGNOSIS — F419 Anxiety disorder, unspecified: Secondary | ICD-10-CM | POA: Diagnosis not present

## 2024-05-31 DIAGNOSIS — R7689 Other specified abnormal immunological findings in serum: Secondary | ICD-10-CM | POA: Diagnosis not present

## 2024-05-31 DIAGNOSIS — Z8719 Personal history of other diseases of the digestive system: Secondary | ICD-10-CM

## 2024-05-31 DIAGNOSIS — M545 Low back pain, unspecified: Secondary | ICD-10-CM

## 2024-05-31 DIAGNOSIS — Z9889 Other specified postprocedural states: Secondary | ICD-10-CM

## 2024-05-31 DIAGNOSIS — R21 Rash and other nonspecific skin eruption: Secondary | ICD-10-CM | POA: Diagnosis not present

## 2024-05-31 DIAGNOSIS — G8929 Other chronic pain: Secondary | ICD-10-CM

## 2024-05-31 DIAGNOSIS — Z0289 Encounter for other administrative examinations: Secondary | ICD-10-CM
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
## 2024-05-31 NOTE — Telephone Encounter (Signed)
 Pt calling giving fax# to be able to fax his FMLA  [phone] .

## 2024-05-31 NOTE — Patient Instructions (Signed)
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
## 2024-06-08 ENCOUNTER — Ambulatory Visit: Admitting: General Surgery

## 2024-06-08 ENCOUNTER — Encounter: Payer: Self-pay | Admitting: General Surgery

## 2024-06-08 VITALS — BP 105/65 | HR 87 | Ht 71.0 in | Wt 247.0 lb

## 2024-06-08 DIAGNOSIS — K429 Umbilical hernia without obstruction or gangrene: Secondary | ICD-10-CM | POA: Diagnosis not present

## 2024-06-08 DIAGNOSIS — Z09 Encounter for follow-up examination after completed treatment for conditions other than malignant neoplasm: Secondary | ICD-10-CM | POA: Diagnosis not present

## 2024-06-08 NOTE — Progress Notes (Signed)
 Outpatient Surgical Follow Up  06/08/2024  Patrick Hahn is an 43 y.o. male.   Chief Complaint  Patient presents with   Routine Post Op    HPI: Patient returns today status post open umbilical hernia repair.  He reports doing well.  He says has a little bit of soreness in the right lower quadrant but otherwise no pain at surgical site.  Denies any evidence of bulge or recurrence of hernia.  He is tolerating a diet and having normal bowel function.  Past Medical History:  Diagnosis Date   Anxiety    Chronic back pain    IDA (iron deficiency anemia)    Psoriasis    Thrombocytopenia     Past Surgical History:  Procedure Laterality Date   GYNECOMASTIA EXCISION     43 years old   UMBILICAL HERNIA REPAIR N/A 05/24/2024   Procedure: REPAIR, HERNIA, UMBILICAL, ADULT;  Surgeon: Marinda Jayson KIDD, MD;  Location: [facility] ORS;  Service: General;  Laterality: N/A;  open procedure    Family History  Problem Relation Age of Onset   Healthy Mother    Prostate cancer Father    Anemia Sister    Anemia Sister    Anemia Sister    Anemia Sister    Healthy Brother    Cancer Maternal Uncle        throat   Breast cancer Maternal Grandmother 10   Asthma Son    Healthy Son    Healthy Daughter    Diabetes Neg Hx    Early death Neg Hx    Heart disease Neg Hx    Stroke Neg Hx     Social History:  reports that he has never smoked. He has never been exposed to tobacco smoke. He has never used smokeless tobacco. He reports current alcohol use of about 1.0 standard drink of alcohol per week. He reports that he does not use drugs.  Allergies: No Known Allergies  Medications reviewed.    ROS Full ROS performed and is otherwise negative other than what is stated in HPI   BP 105/65   Pulse 87   Ht 5' 11 (1.803 m)   Wt 247 lb (112 kg)   SpO2 98%   BMI 34.45 kg/m   Physical Exam Abdomen soft, nontender and nondistended.  At his umbilicus no evidence of recurrence of hernia.  He does  have some glue still in the incision but no erythema or drainage from the incision.    No results found for this or any previous visit (from the past 48 hours). No results found.  Assessment/Plan:  Patient status post umbilical hernia repair.  I discussed with him that it is okay for him to submerge the wounds in water now.  The glue will peel off over time and we talked about local wound care.  Continue lifting restrictions of no greater than 10 to 15 pounds for 4 weeks.  He can follow-up with us  as needed.   Jayson Marinda, M.D. [facility]

## 2024-06-08 NOTE — Patient Instructions (Signed)

## 2024-06-10 ENCOUNTER — Telehealth: Payer: Self-pay | Admitting: *Deleted

## 2024-06-10 ENCOUNTER — Ambulatory Visit: Admitting: Rheumatology

## 2024-06-10 NOTE — Telephone Encounter (Signed)
 Faxed FMLA to [phone]

## 2024-06-14 ENCOUNTER — Ambulatory Visit: Admitting: Dermatology

## 2024-06-14 ENCOUNTER — Other Ambulatory Visit: Payer: Self-pay | Admitting: Dermatology

## 2024-06-14 ENCOUNTER — Inpatient Hospital Stay

## 2024-06-14 VITALS — BP 109/66 | HR 98 | Temp 97.9°F | Resp 20 | Wt 249.5 lb

## 2024-06-14 VITALS — BP 116/72

## 2024-06-14 DIAGNOSIS — D508 Other iron deficiency anemias: Secondary | ICD-10-CM | POA: Insufficient documentation

## 2024-06-14 DIAGNOSIS — L309 Dermatitis, unspecified: Secondary | ICD-10-CM

## 2024-06-14 DIAGNOSIS — E669 Obesity, unspecified: Secondary | ICD-10-CM | POA: Diagnosis not present

## 2024-06-14 DIAGNOSIS — L719 Rosacea, unspecified: Secondary | ICD-10-CM | POA: Diagnosis not present

## 2024-06-14 DIAGNOSIS — Z8042 Family history of malignant neoplasm of prostate: Secondary | ICD-10-CM | POA: Insufficient documentation

## 2024-06-14 DIAGNOSIS — D696 Thrombocytopenia, unspecified: Secondary | ICD-10-CM

## 2024-06-14 DIAGNOSIS — M2550 Pain in unspecified joint: Secondary | ICD-10-CM

## 2024-06-14 DIAGNOSIS — R7689 Other specified abnormal immunological findings in serum: Secondary | ICD-10-CM

## 2024-06-14 DIAGNOSIS — L209 Atopic dermatitis, unspecified: Secondary | ICD-10-CM

## 2024-06-14 DIAGNOSIS — M791 Myalgia, unspecified site: Secondary | ICD-10-CM | POA: Diagnosis not present

## 2024-06-14 DIAGNOSIS — L299 Pruritus, unspecified: Secondary | ICD-10-CM

## 2024-06-14 DIAGNOSIS — F419 Anxiety disorder, unspecified: Secondary | ICD-10-CM | POA: Insufficient documentation

## 2024-06-14 DIAGNOSIS — R21 Rash and other nonspecific skin eruption: Secondary | ICD-10-CM

## 2024-06-14 DIAGNOSIS — G8929 Other chronic pain: Secondary | ICD-10-CM | POA: Diagnosis not present

## 2024-06-14 LAB — CBC WITH DIFFERENTIAL (CANCER CENTER ONLY)
Abs Immature Granulocytes: 0.01 K/uL (ref 0.00–0.07)
Basophils Absolute: 0 K/uL (ref 0.0–0.1)
Basophils Relative: 1 %
Eosinophils Absolute: 0.1 K/uL (ref 0.0–0.5)
Eosinophils Relative: 1 %
HCT: 34.7 % — ABNORMAL LOW (ref 39.0–52.0)
Hemoglobin: 12.8 g/dL — ABNORMAL LOW (ref 13.0–17.0)
Immature Granulocytes: 0 %
Lymphocytes Relative: 41 %
Lymphs Abs: 1.9 K/uL (ref 0.7–4.0)
MCH: 27.1 pg (ref 26.0–34.0)
MCHC: 36.9 g/dL — ABNORMAL HIGH (ref 30.0–36.0)
MCV: 73.4 fL — ABNORMAL LOW (ref 80.0–100.0)
Monocytes Absolute: 0.5 K/uL (ref 0.1–1.0)
Monocytes Relative: 11 %
Neutro Abs: 2 K/uL (ref 1.7–7.7)
Neutrophils Relative %: 46 %
Platelet Count: 207 K/uL (ref 150–400)
RBC: 4.73 MIL/uL (ref 4.22–5.81)
RDW: 16 % — ABNORMAL HIGH (ref 11.5–15.5)
WBC Count: 4.5 K/uL (ref 4.0–10.5)
nRBC: 0.4 % — ABNORMAL HIGH (ref 0.0–0.2)

## 2024-06-14 LAB — IRON AND IRON BINDING CAPACITY (CC-WL,HP ONLY)
Iron: 77 ug/dL (ref 45–182)
Saturation Ratios: 32 % (ref 17.9–39.5)
TIBC: 239 ug/dL — ABNORMAL LOW (ref 250–450)
UIBC: 162 ug/dL (ref 117–376)

## 2024-06-14 LAB — GAMMA GT: GGT: 25 U/L (ref 7–50)

## 2024-06-14 LAB — CMP (CANCER CENTER ONLY)
ALT: 12 U/L (ref 0–44)
AST: 14 U/L — ABNORMAL LOW (ref 15–41)
Albumin: 4.3 g/dL (ref 3.5–5.0)
Alkaline Phosphatase: 75 U/L (ref 38–126)
Anion gap: 5 (ref 5–15)
BUN: 11 mg/dL (ref 6–20)
CO2: 28 mmol/L (ref 22–32)
Calcium: 9 mg/dL (ref 8.9–10.3)
Chloride: 107 mmol/L (ref 98–111)
Creatinine: 1.12 mg/dL (ref 0.61–1.24)
GFR, Estimated: >60 mL/min (ref >60)
Glucose, Bld: 84 mg/dL (ref 70–99)
Potassium: 3.7 mmol/L (ref 3.5–5.1)
Sodium: 140 mmol/L (ref 135–145)
Total Bilirubin: 1.5 mg/dL — ABNORMAL HIGH (ref 0.0–1.2)
Total Protein: 7.9 g/dL (ref 6.5–8.1)

## 2024-06-14 LAB — HEPATITIS C ANTIBODY: HCV Ab: NONREACTIVE

## 2024-06-14 LAB — FERRITIN: Ferritin: 233 ng/mL (ref 24–336)

## 2024-06-14 LAB — FOLATE: Folate: 7.9 ng/mL (ref >5.9)

## 2024-06-14 LAB — HIV ANTIBODY (ROUTINE TESTING W REFLEX): HIV Screen 4th Generation wRfx: NONREACTIVE

## 2024-06-14 LAB — VITAMIN B12: Vitamin B-12: 390 pg/mL (ref 180–914)

## 2024-06-14 MED ORDER — PIMECROLIMUS 1 % EX CREA: TOPICAL_CREAM | Freq: Two times a day (BID) | CUTANEOUS | 0 refills | Status: DC

## 2024-06-14 MED ORDER — CLOBETASOL PROPIONATE 0.05 % EX OINT: 1.0000 | TOPICAL_OINTMENT | Freq: Two times a day (BID) | CUTANEOUS | 3 refills | Status: AC

## 2024-06-14 NOTE — Assessment & Plan Note (Addendum)
 No recent lab.  Will obtain ferritin, iron panel. If iron deficiency, we will recommend replacement

## 2024-06-14 NOTE — Progress Notes (Unsigned)
 New Patient Visit   Subjective  Patrick Hahn is a 43 y.o. male who presents for the following: Rash .  He has been experiencing an intermittent erythematous facial rash for approximately two years, primarily in a butterfly distribution on the malar cheeks and nose. It does not appear to be related to sun exposure, as it began when he changed work locations from [place] to [place], initially working indoors. He does not use sunscreen and notes improvement in the rash recently.  He has a history of joint pain and myalgias, with ANA levels consistently high, trending upwards from 1:300 a year ago to 1:1300 recently. He has not yet used the azelaic acid prescribed by his rheumatologist on October 3rd.  He experiences dryness and mild atopic dermatitis, particularly on his legs, with dry spots and itchiness. He uses clobetasol  for itch relief, which he finds effective, but it does not alleviate dryness. He has not applied clobetasol  recently and expresses a need for more. He uses Lubriderm and Jurgens as moisturizers.  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 He takes two showers a day due to sweating, which contributes to his skin dryness.  The following portions of the chart were reviewed this encounter and updated as appropriate: medications, allergies, medical history  Review of Systems:  No other skin or systemic complaints except as noted in HPI or Assessment and Plan.  Objective  Well appearing patient in no apparent distress; mood and affect are within normal limits.  A focused examination was performed of the following areas: Face  Relevant exam findings are noted in the Assessment and Plan.  Component     Latest Ref Rng 08/27/2023 01/30/2024  Glucose     65 - 99 mg/dL    BUN     7 - 25 mg/dL    Creatinine     9.39 - 1.29 mg/dL    eGFR     > OR = 60 fO/fpw/8.26f7    BUN/Creatinine Ratio     6 - 22 (calc)     Sodium     135 - 146 mmol/L    Potassium     3.5 - 5.3 mmol/L    Chloride     98 - 110 mmol/L    CO2     20 - 32 mmol/L    Calcium     8.6 - 10.3 mg/dL    Total Protein     6.1 - 8.1 g/dL    Albumin MSPROF     3.6 - 5.1 g/dL    Globulin     1.9 - 3.7 g/dL (calc)    AG Ratio     1.0 - 2.5 (calc)    Total Bilirubin     0.2 - 1.2 mg/dL    Alkaline phosphatase (APISO)     36 - 130 U/L    AST     10 - 40 U/L    ALT     9 - 46 U/L    Color, UA     yellow   brown !   Clarity, UA     clear   cloudy !   Glucose     negative mg/dL  negative   Bilirubin, UA     negative   negative   Ketones, UA     negative mg/dL  negative   Specific Gravity, UA     1.010 - 1.025   1.025  RBC, UA     negative   trace-lysed !   pH, UA     5.0 - 8.0   6.0   POC PROTEIN,UA     negative, trace   trace   Urobilinogen, UA     0.2 or 1.0 E.U./dL  0.2   Nitrite, UA     Negative   Negative   Leukocytes,UA     Negative   Negative   WBC     3.8 - 10.8 Thousand/uL    RBC     4.20 - 5.80 Million/uL    Hemoglobin     13.2 - 17.1 g/dL    HCT     61.4 - 49.9 %    MCV     80.0 - 100.0 fL    MCH     27.0 - 33.0 pg    MCHC     32.0 - 36.0 g/dL    RDW     88.9 - 84.9 %    Platelets     140 - 400 Thousand/uL    MPV     7.5 - 12.5 fL    Neisseria Gonorrhea  Negative   Chlamydia  Negative   Trichomonas  Negative   Comment  Normal Reference Range Trichomonas - Negative   Comment  Normal Reference Ranger Chlamydia - Negative   Comment  Normal Reference Range Neisseria Gonorrhea - Negative   Cholesterol     <200 mg/dL    HDL Cholesterol     > OR = 40 mg/dL    Triglycerides     <849 mg/dL    LDL Cholesterol (Calc)     mg/dL (calc)    Total CHOL/HDL Ratio     <5.0 (calc)    Non-HDL Cholesterol (Calc)     <130 mg/dL (calc)    MICRO NUMBER:  [id number]   SPECIMEN QUALITY:  Adequate   Sample Source  URINE   STATUS:  FINAL   Result  No Growth   Creatinine, Urine     20 - 320 mg/dL     Protein/Creat Ratio     25 - 148 mg/g creat    Protein/Creatinine Ratio     0.025 - 0.148 mg/mg creat    Total Protein, Urine     5 - 25 mg/dL    Hemoglobin J8R     <4.2 %    Mean Plasma Glucose     mg/dL    eAG (mmol/L)     mmol/L    Lupus Anticoagulant    PTT-LA Screen     <=40 sec    DRVVT     <=45 sec    ANA Titer 1     titer 1:320 (H)    ANA Pattern 1 Nuclear, Centromere !    Phosphatidylserine/Prothrombin Ab (IgG)     <=30 U    Phosphatidylserine/Prothrombin Ab (IgM)     <=30 U    Anti Nuclear Antibody (ANA)     NEGATIVE     Ribonucleic Protein(ENA) Antibody, IgG     <1.0 NEG AI    ENA SM Ab Ser-aCnc     <1.0 NEG AI    SSA (Ro) (ENA) Antibody, IgG     <1.0 NEG AI    SSB (La) (ENA) Antibody, IgG     <1.0 NEG AI    ds DNA Ab     IU/mL     Component     Latest Ref Rng 02/26/2024 05/14/2024  Glucose  65 - 99 mg/dL 95    BUN     7 - 25 mg/dL 10    Creatinine     9.39 - 1.29 mg/dL 8.97    eGFR     > OR = 60 mL/min/1.12m2 94    BUN/Creatinine Ratio     6 - 22 (calc) SEE NOTE:    Sodium     135 - 146 mmol/L 139    Potassium     3.5 - 5.3 mmol/L 3.7    Chloride     98 - 110 mmol/L 104    CO2     20 - 32 mmol/L 28    Calcium     8.6 - 10.3 mg/dL 9.0    Total Protein     6.1 - 8.1 g/dL 7.0    Albumin MSPROF     3.6 - 5.1 g/dL 4.0    Globulin     1.9 - 3.7 g/dL (calc) 3.0    AG Ratio     1.0 - 2.5 (calc) 1.3    Total Bilirubin     0.2 - 1.2 mg/dL 1.6 (H)    Alkaline phosphatase (APISO)     36 - 130 U/L 75    AST     10 - 40 U/L 31    ALT     9 - 46 U/L 45    Color, UA     yellow     Clarity, UA     clear     Glucose     negative mg/dL    Bilirubin, UA     negative     Ketones, UA     negative mg/dL    Specific Gravity, UA     1.010 - 1.025     RBC, UA     negative     pH, UA     5.0 - 8.0     POC PROTEIN,UA     negative, trace     Urobilinogen, UA     0.2 or 1.0 E.U./dL    Nitrite, UA     Negative     Leukocytes,UA      Negative     WBC     3.8 - 10.8 Thousand/uL 4.9    RBC     4.20 - 5.80 Million/uL 4.70    Hemoglobin     13.2 - 17.1 g/dL 87.4 (L)    HCT     61.4 - 50.0 % 38.5    MCV     80.0 - 100.0 fL 81.9    MCH     27.0 - 33.0 pg 26.6 (L)    MCHC     32.0 - 36.0 g/dL 67.4    RDW     88.9 - 15.0 % 17.0 (H)    Platelets     140 - 400 Thousand/uL 109 (L)    MPV     7.5 - 12.5 fL 11.4    Neisseria Gonorrhea    Chlamydia    Trichomonas    Comment    Cholesterol     <200 mg/dL 885    HDL Cholesterol     > OR = 40 mg/dL 36 (L)    Triglycerides     <150 mg/dL 74    LDL Cholesterol (Calc)     mg/dL (calc) 63    Total CHOL/HDL Ratio     <5.0 (calc) 3.2    Non-HDL Cholesterol (Calc)     <  130 mg/dL (calc) 78    MICRO NUMBER:    SPECIMEN QUALITY:    Sample Source    STATUS:    Result    Creatinine, Urine     20 - 320 mg/dL  712   Protein/Creat Ratio     25 - 148 mg/g creat  84   Protein/Creatinine Ratio     0.025 - 0.148 mg/mg creat  0.084   Total Protein, Urine     5 - 25 mg/dL  24   Hemoglobin J8R     <5.7 % 4.4    Mean Plasma Glucose     mg/dL 80    eAG (mmol/L)     mmol/L 4.4    Lupus Anticoagulant  see note   PTT-LA Screen     <=40 sec  37   DRVVT     <=45 sec  36   ANA Titer 1     titer  1:1,280 (H)   ANA Pattern 1  Nuclear, Centromere !   Phosphatidylserine/Prothrombin Ab (IgG)     <=30 U  23   Phosphatidylserine/Prothrombin Ab (IgM)     <=30 U  16   Anti Nuclear Antibody (ANA)     NEGATIVE   POSITIVE !   Ribonucleic Protein(ENA) Antibody, IgG     <1.0 NEG AI  <1.0 NEG   ENA SM Ab Ser-aCnc     <1.0 NEG AI  <1.0 NEG   SSA (Ro) (ENA) Antibody, IgG     <1.0 NEG AI  <1.0 NEG   SSB (La) (ENA) Antibody, IgG     <1.0 NEG AI  <1.0 NEG   ds DNA Ab     IU/mL  5 (H)     Legend: (H) High ! Abnormal (L) Low Right Malar Cheek Butterfly erythematous rash  Assessment & Plan   Erythematous facial plaque, rule out systemic lupus erythematosus versus  rosacea Chronic erythematous rash on the face, present for two years, with a butterfly distribution on the malar cheeks and nose. Positive ANA with increasing titers, suggesting possible systemic lupus erythematosus (SLE). Differential diagnosis includes rosacea. Rheumatologist leans towards rosacea, but dermatologist suspects lupus due to lab findings and rash characteristics. Biopsy proposed for definitive diagnosis. - Performed 3 mm punch biopsy of the active erythematous plaque on the right malar cheek. - Prescribed pimecrolimus to apply twice daily for two weeks. - Provided samples of Avene cicalfate for facial moisturizing. - Instructed to wash face with Avene cleanser, apply hydrocortisone 2.5% cream, and then Cicalfate cream.  Atopic dermatitis involving extremities with severe dryness and pruritus Severe dryness and pruritus on extremities, with scaly plaques. Itch severity rated 7/10. Current treatment with clobetasol  is effective but requires strict usage guidelines to prevent skin thinning and other side effects. - Refilled clobetasol  ointment for use twice daily for two weeks, followed by a two-week break. - Advised use of Aquaphor or Vaseline during clobetasol  break. - Recommended Eucerin Advanced Repair moisturizer for daily use. - Instructed to apply clobetasol  on active areas and moisturizer on the rest of the body after showering. - Advised against hot showers; use warm water and moisturize immediately after showering. - Recommended Dove soap for general use and Dial for sweaty areas.  Joint pain and myalgias, possible connective tissue disease Joint pain and myalgias with positive ANA and increasing titers, suggestive of possible connective tissue disease. Lupus is a consideration given the facial rash and lab findings. - Continue follow-up with rheumatologist for further evaluation and management.  RASH Right Malar Cheek Skin / nail biopsy - Right Malar Cheek Type of biopsy:  punch   Informed consent: discussed and consent obtained   Timeout: patient name, date of birth, surgical site, and procedure verified   Procedure prep:  Patient was prepped and draped in usual sterile fashion (the patient was cleaned and prepped) Prep type:  Isopropyl alcohol Anesthesia: the lesion was anesthetized in a standard fashion   Anesthetic:  1% lidocaine w/ epinephrine 1-100,000 buffered w/ 8.4% NaHCO3 Punch size:  3 mm Suture size:  4-0 Suture type: nylon   Hemostasis achieved with: suture, pressure and aluminum chloride   Outcome: patient tolerated procedure well   Post-procedure details: sterile dressing applied and wound care instructions given   Dressing type: bandage, petrolatum and pressure dressing    ATOPIC DERMATITIS Exam: Scaly pink papules coalescing to plaques  flared  Atopic dermatitis (eczema) is a chronic, relapsing, pruritic condition that can significantly affect quality of life. It is often associated with allergic rhinitis and/or asthma and can require treatment with topical medications, phototherapy, or in severe cases biologic injectable medication (Dupixent; Adbry) or Oral JAK inhibitors.  Treatment Plan: -Will prescribe more Clobetasol  to use BID for two weeks. Take a break for at least two weeks, start again as needed for flares -Use Aquaphor on the off weeks and in between applications -Recommends Eucerin Advanced Repair lotion to use for dry skin  Recommend gentle skin care such as Dove soap.  Rosacea VS Lupus Exam: Butterfly erythematous rash on the face   Treatment Plan: Discussed the possibility of doing a biopsy to get a more definitive diagnosis    Return in about 2 weeks (around 06/28/2024) for suture removal/biopsy results.  I, Gordan Beams, CMA, am acting as scribe for [employer], DO.   Documentation: I have reviewed the above documentation for accuracy and completeness, and I agree with the above.  Delon Lenis, DO

## 2024-06-14 NOTE — Progress Notes (Signed)
 [facility] CONSULT NOTE  Patient Care Team: Antonette Angeline ORN, NP as PCP - General (Internal Medicine)  ASSESSMENT & PLAN:  Patrick Hahn is a 43 y.o. male with history of eczema, thrombocytopenia, chronic back pain, anxiety, iron deficiency, obesity, hernia being seen for iron deficiency anemia and thrombocytopenia.  Relevant history: History of blood donation Last colonoscopy: not yet  No concerning symptoms based on history and exam today.  Discussed obtaining testing today.  If further intervention needed, we will let him know.   Patient endorsed not having balanced diet and processed food frequently.  Discussed healthy dietary habits, decrease processed foods, increase vegetables and fruits. Assessment & Plan Other iron deficiency anemia No recent lab.  Will obtain ferritin, iron panel. If iron deficiency, we will recommend replacement Thrombocytopenia B12, folate, hepatitis panel and HIV  Orders Placed This Encounter  Procedures   CBC with Differential (Cancer Center Only)    Standing Status:   Future    Number of Occurrences:   1    Expiration Date:   06/14/2025   CMP (Cancer Center only)    Standing Status:   Future    Number of Occurrences:   1    Expiration Date:   06/14/2025   Ferritin    Standing Status:   Future    Number of Occurrences:   1    Expiration Date:   06/14/2025   Folate    Standing Status:   Future    Number of Occurrences:   1    Expiration Date:   06/14/2025   Gamma GT    Standing Status:   Future    Number of Occurrences:   1    Expiration Date:   06/14/2025   Hepatitis C antibody    Standing Status:   Future    Number of Occurrences:   1    Expiration Date:   06/14/2025   Vitamin B12    Standing Status:   Future    Number of Occurrences:   1    Expiration Date:   06/14/2025   Methylmalonic acid, serum    Standing Status:   Future    Number of Occurrences:   1    Expiration Date:   07/14/2024   Iron and Iron Binding Capacity  ([facility] only)    Standing Status:   Future    Number of Occurrences:   1    Expiration Date:   06/14/2025   HIV antibody (with reflex)    Standing Status:   Future    Number of Occurrences:   1    Expiration Date:   06/14/2025    All questions were answered. The patient knows to call the clinic with any problems, questions or concerns.  Pauletta JAYSON Chihuahua, MD 11/3/202512:41 PM   CHIEF COMPLAINTS/PURPOSE OF CONSULTATION:  Anemia  HISTORY OF PRESENTING ILLNESS:  Patrick Hahn 43 y.o. male is here because of anemia and thrombocytopenia.  Records report iron deficiency anemia.  Most recent hemoglobin was 12.5 in July 2025.  MCV was low normal at 82.  RDW was elevated.  No recent iron study. Report he was told iron was low. No bloody stool, dark stool. Stool calibers are normal. No stomach pain, nausea, vomiting, heartburn. No bloody urine. No colonoscopy. No ice craving or chewing.  No family with colon cancer, stomach cancer or bladder cancer. Father had prostate cancer in 42's. He had radiation and is doing fine.    He reports used to  donate blood at [facility] in April 2025. That was the only.  Thrombocytopenia at 109.  Records show chronic thrombocytopenia as of 2014.  Nadir was 77 in 2023.  Most recent lab in October 2025 showed positive ANA, indeterminate double-stranded DNA.  He reports diet is not balanced. He was working two jobs in 2023. Report mostly processed foods. He does eat meat.  Report noted facial rashes worse in this year when working outside. So he was tested for lupus. He saw dermatology today and had biopsy and thought likely lupus. He has chronic joint pain in the elbow, knees. Sometimes in the wrists. No chest pain, short of breath, coughing. No new supplement or medication.  No daily alcohol.  MEDICAL HISTORY:  Past Medical History:  Diagnosis Date   Anxiety    Chronic back pain    IDA (iron deficiency anemia)    Psoriasis    Thrombocytopenia      SURGICAL HISTORY: Past Surgical History:  Procedure Laterality Date   GYNECOMASTIA EXCISION     43 years old   UMBILICAL HERNIA REPAIR N/A 05/24/2024   Procedure: REPAIR, HERNIA, UMBILICAL, ADULT;  Surgeon: Marinda Jayson KIDD, MD;  Location: [facility] ORS;  Service: General;  Laterality: N/A;  open procedure    SOCIAL HISTORY: Social History   Socioeconomic History   Marital status: Married    Spouse name: Patrick Hahn   Number of children: Not on file   Years of education: Not on file   Highest education level: Associate degree: occupational, scientist, product/process development, or vocational program  Occupational History   Not on file  Tobacco Use   Smoking status: Never    Passive exposure: Never   Smokeless tobacco: Never  Vaping Use   Vaping status: Never Used  Substance and Sexual Activity   Alcohol use: Yes    Alcohol/week: 1.0 standard drink of alcohol    Types: 1 Shots of liquor per week    Comment: occasional   Drug use: No   Sexual activity: Yes  Other Topics Concern   Not on file  Social History Narrative   Not on file   Social Drivers of Health   Financial Resource Strain: Medium Risk (05/31/2024)   Overall Financial Resource Strain (CARDIA)    Difficulty of Paying Living Expenses: Somewhat hard  Food Insecurity: No Food Insecurity (06/14/2024)   Hunger Vital Sign    Worried About Running Out of Food in the Last Year: Never true    Ran Out of Food in the Last Year: Never true  Transportation Needs: No Transportation Needs (06/14/2024)   PRAPARE - Administrator, Civil Service (Medical): No    Lack of Transportation (Non-Medical): No  Recent Concern: Transportation Needs - Unmet Transportation Needs (06/13/2024)   PRAPARE - Transportation    Lack of Transportation (Medical): No    Lack of Transportation (Non-Medical): Yes  Physical Activity: Insufficiently Active (05/31/2024)   Exercise Vital Sign    Days of Exercise per Week: 4 days    Minutes of Exercise per Session: 10  min  Stress: Stress Concern Present (05/31/2024)   [employer] of Occupational Health - Occupational Stress Questionnaire    Feeling of Stress: Very much  Social Connections: Socially Isolated (05/31/2024)   Social Connection and Isolation Panel    Frequency of Communication with Friends and Family: Once a week    Frequency of Social Gatherings with Friends and Family: Once a week    Attends Religious Services: Never  Active Member of Clubs or Organizations: No    Attends Banker Meetings: Not on file    Marital Status: Married  Intimate Partner Violence: Not At Risk (06/14/2024)   Humiliation, Afraid, Rape, and Kick questionnaire    Fear of Current or Ex-Partner: No    Emotionally Abused: No    Physically Abused: No    Sexually Abused: No    FAMILY HISTORY: Family History  Problem Relation Age of Onset   Healthy Mother    Prostate cancer Father    Anemia Sister    Anemia Sister    Anemia Sister    Anemia Sister    Healthy Brother    Cancer Maternal Uncle        throat   Breast cancer Maternal Grandmother 28   Asthma Son    Healthy Son    Healthy Daughter    Diabetes Neg Hx    Early death Neg Hx    Heart disease Neg Hx    Stroke Neg Hx     ALLERGIES:  has no known allergies.  MEDICATIONS:  Current Outpatient Medications  Medication Sig Dispense Refill   clobetasol  ointment (TEMOVATE ) 0.05 % Apply 1 Application topically 2 (two) times daily. 100 g 3   ALPRAZolam  (XANAX ) 0.5 MG tablet Take 1 tablet (0.5 mg total) by mouth daily as needed for anxiety. (Patient not taking: Reported on 06/14/2024) 10 tablet 0   Azelaic Acid 15 % gel Apply 1 Application topically 2 (two) times daily. (Patient not taking: Reported on 06/14/2024) 50 g 0   cyclobenzaprine  (FLEXERIL ) 10 MG tablet Take 1 tablet (10 mg total) by mouth 3 (three) times daily as needed for muscle spasms. (Patient not taking: Reported on 06/14/2024) 15 tablet 0   No current facility-administered  medications for this visit.    REVIEW OF SYSTEMS:   All relevant systems were reviewed with the patient and are negative.  PHYSICAL EXAMINATION: ECOG PERFORMANCE STATUS: 0 - Asymptomatic  Vitals:   06/14/24 1128  BP: 109/66  Pulse: 98  Resp: 20  Temp: 97.9 F (36.6 C)  SpO2: 100%   Filed Weights   06/14/24 1128  Weight: 249 lb 8 oz (113.2 kg)    GENERAL: alert, no distress and comfortable EYES: sclera clear Neck: No cervical or axillary lymphadenopathy LUNGS: normal breathing effort HEART: regular rate & rhythm ABDOMEN: abdomen soft, non-tender and nondistended  RADIOGRAPHIC STUDIES: I have personally reviewed the radiological images as listed and agreed with the findings in the report. MM 3D DIAGNOSTIC MAMMOGRAM UNILATERAL LEFT BREAST Result Date: 05/21/2024 CLINICAL DATA:  Patient presents for BI-RADS 3 follow-up of presumed LEFT breast accessory breast tissue. History of a remote bilateral gynecomastia surgery. Painful area in the LEFT axilla. EXAM: DIGITAL DIAGNOSTIC UNILATERAL LEFT MAMMOGRAM WITH TOMOSYNTHESIS AND CAD; US  AXILLARY LEFT TECHNIQUE: Left digital diagnostic mammography and breast tomosynthesis was performed. The images were evaluated with computer-aided detection. ; Targeted ultrasound examination of the left axilla was performed. COMPARISON:  Previous exam(s). ACR Breast Density Category b: There are scattered areas of fibroglandular density. FINDINGS: Spot compression tomosynthesis views were obtained over the area of focal pain in the LEFT breast. There is a grossly similar asymmetry with interdigitating fat noted subjacent to the site of painful concern in the LEFT axilla. Postsurgical changes are noted in the LEFT breast. No suspicious mass, microcalcification, or other finding is identified in the LEFT breast. On physical exam, no suspicious mass is appreciated. There is soft fullness to the  LEFT axilla. Targeted LEFT breast ultrasound was performed in the  area of pain at the axilla. There is revisualization of an island of echogenic tissue with fibroglandular type pattern throughout the LEFT axilla. This is similar in configuration compared to prior ultrasound. No enlarged axillary lymph nodes are visualized. IMPRESSION: 1. Similar appearance of a focal asymmetry in the LEFT axilla, likely accessory breast tissue in this patient with a history of gynecomastia surgery. Option for continued short-term follow-up versus definitive characterization with ultrasound-guided biopsy was discussed with patient. Patient would prefer to proceed with short-term follow-up at this point in time. As such, recommend LEFT diagnostic mammogram and ultrasound in 6 months. This will establish 1 year of definitive stability. 2. Gynecomastia is common and can occur with changes in the testosterone:estrogen ratio. Potential causes of gynecomastia include numerous prescription medications, dietary supplements, anabolic steroids, and recreational drugs, particularly marijuana. Other causes include hormone secreting testicular or pituitary tumors. Gynecomastia can be related to other medical problems, such as kidney, thyroid , or liver disease. Gynecomastia often resolves on its own. RECOMMENDATION: LEFT diagnostic mammogram and ultrasound in 6 months. I have discussed the findings and recommendations with the patient. If applicable, a reminder letter will be sent to the patient regarding the next appointment. BI-RADS CATEGORY  3: Probably benign. Electronically Signed   By: Corean Salter M.D.   On: 05/21/2024 16:13   US  AXILLA LEFT Result Date: 05/21/2024 CLINICAL DATA:  Patient presents for BI-RADS 3 follow-up of presumed LEFT breast accessory breast tissue. History of a remote bilateral gynecomastia surgery. Painful area in the LEFT axilla. EXAM: DIGITAL DIAGNOSTIC UNILATERAL LEFT MAMMOGRAM WITH TOMOSYNTHESIS AND CAD; US  AXILLARY LEFT TECHNIQUE: Left digital diagnostic mammography  and breast tomosynthesis was performed. The images were evaluated with computer-aided detection. ; Targeted ultrasound examination of the left axilla was performed. COMPARISON:  Previous exam(s). ACR Breast Density Category b: There are scattered areas of fibroglandular density. FINDINGS: Spot compression tomosynthesis views were obtained over the area of focal pain in the LEFT breast. There is a grossly similar asymmetry with interdigitating fat noted subjacent to the site of painful concern in the LEFT axilla. Postsurgical changes are noted in the LEFT breast. No suspicious mass, microcalcification, or other finding is identified in the LEFT breast. On physical exam, no suspicious mass is appreciated. There is soft fullness to the LEFT axilla. Targeted LEFT breast ultrasound was performed in the area of pain at the axilla. There is revisualization of an island of echogenic tissue with fibroglandular type pattern throughout the LEFT axilla. This is similar in configuration compared to prior ultrasound. No enlarged axillary lymph nodes are visualized. IMPRESSION: 1. Similar appearance of a focal asymmetry in the LEFT axilla, likely accessory breast tissue in this patient with a history of gynecomastia surgery. Option for continued short-term follow-up versus definitive characterization with ultrasound-guided biopsy was discussed with patient. Patient would prefer to proceed with short-term follow-up at this point in time. As such, recommend LEFT diagnostic mammogram and ultrasound in 6 months. This will establish 1 year of definitive stability. 2. Gynecomastia is common and can occur with changes in the testosterone:estrogen ratio. Potential causes of gynecomastia include numerous prescription medications, dietary supplements, anabolic steroids, and recreational drugs, particularly marijuana. Other causes include hormone secreting testicular or pituitary tumors. Gynecomastia can be related to other medical problems,  such as kidney, thyroid , or liver disease. Gynecomastia often resolves on its own. RECOMMENDATION: LEFT diagnostic mammogram and ultrasound in 6 months. I have discussed the findings and  recommendations with the patient. If applicable, a reminder letter will be sent to the patient regarding the next appointment. BI-RADS CATEGORY  3: Probably benign. Electronically Signed   By: Corean Salter M.D.   On: 05/21/2024 16:13

## 2024-06-14 NOTE — Patient Instructions (Addendum)
 VISIT SUMMARY:  During your visit, we discussed your persistent facial rash, severe dryness and itchiness on your extremities, and joint pain with muscle aches. We performed a biopsy to help determine the cause of your facial rash and provided new treatments to help manage your symptoms.  YOUR PLAN:  -ERYTHEMATOUS FACIAL PLAQUE: You have a chronic red rash on your face, which could be due to lupus or rosacea. We performed a biopsy to help determine the exact cause. In the meantime, apply pimecrolimus to the affected area twice daily for two weeks, and use the provided Evensiclophate moisturizer after washing your face with Evencleanser.  -ATOPIC DERMATITIS INVOLVING EXTREMITIES WITH SEVERE DRYNESS AND PRURITUS: You have severe dryness and itchiness on your arms and legs. Continue using clobetasol  ointment twice daily for two weeks, then take a two-week break. During the break, use Aquaphor or Vaseline. Apply Eucerin Advanced Repair moisturizer daily, and use Dove soap for general washing and Dial soap for sweaty areas. Avoid hot showers and moisturize immediately after showering.  -JOINT PAIN AND MYALGIAS, POSSIBLE CONNECTIVE TISSUE DISEASE: You have joint pain and muscle aches, which may be related to a connective tissue disease like lupus. Continue to follow up with your rheumatologist for further evaluation and management.  INSTRUCTIONS:  Please follow up with your rheumatologist for further evaluation of your joint pain and muscle aches. We will contact you with the biopsy results to determine the cause of your facial rash.   Patient Handout: Wound Care for Skin Biopsy Site  Taking Care of Your Skin Biopsy Site  Proper care of the biopsy site is essential for promoting healing and minimizing scarring. This handout provides instructions on how to care for your biopsy site to ensure optimal recovery.  1. Cleaning the Wound:  Clean the biopsy site daily with gentle soap and water. Gently  pat the area dry with a clean, soft towel. Avoid harsh scrubbing or rubbing the area, as this can irritate the skin and delay healing.  2. Applying Aquaphor and Bandage:  After cleaning the wound, apply a thin layer of Aquaphor ointment to the biopsy site. Cover the area with a sterile bandage to protect it from dirt, bacteria, and friction. Change the bandage daily or as needed if it becomes soiled or wet.  3. Continued Care for One Week:  Repeat the cleaning, Aquaphor application, and bandaging process daily for one week following the biopsy procedure. Keeping the wound clean and moist during this initial healing period will help prevent infection and promote optimal healing.  4. Massaging Aquaphor into the Area:  ---After one week, discontinue the use of bandages but continue to apply Aquaphor to the biopsy site. ----Gently massage the Aquaphor into the area using circular motions. ---Massaging the skin helps to promote circulation and prevent the formation of scar tissue.   Additional Tips:  Avoid exposing the biopsy site to direct sunlight during the healing process, as this can cause hyperpigmentation or worsen scarring. If you experience any signs of infection, such as increased redness, swelling, warmth, or drainage from the wound, contact your healthcare provider immediately. Follow any additional instructions provided by your healthcare provider for caring for the biopsy site and managing any discomfort. Conclusion:  Taking proper care of your skin biopsy site is crucial for ensuring optimal healing and minimizing scarring. By following these instructions for cleaning, applying Aquaphor, and massaging the area, you can promote a smooth and successful recovery. If you have any questions or concerns about caring for  your biopsy site, don't hesitate to contact your healthcare provider for guidance.    Important Information  Due to recent changes in healthcare laws, you may see  results of your pathology and/or laboratory studies on MyChart before the doctors have had a chance to review them. We understand that in some cases there may be results that are confusing or concerning to you. Please understand that not all results are received at the same time and often the doctors may need to interpret multiple results in order to provide you with the best plan of care or course of treatment. Therefore, we ask that you please give us  2 business days to thoroughly review all your results before contacting the office for clarification. Should we see a critical lab result, you will be contacted sooner.   If You Need Anything After Your Visit  If you have any questions or concerns for your doctor, please call our main line at [phone] If no one answers, please leave a voicemail as directed and we will return your call as soon as possible. Messages left after 4 pm will be answered the following business day.   You may also send us  a message via MyChart. We typically respond to MyChart messages within 1-2 business days.  For prescription refills, please ask your pharmacy to contact our office. Our fax number is [phone].  If you have an urgent issue when the clinic is closed that cannot wait until the next business day, you can page your doctor at the number below.    Please note that while we do our best to be available for urgent issues outside of office hours, we are not available 24/7.   If you have an urgent issue and are unable to reach us , you may choose to seek medical care at your doctor's office, retail clinic, urgent care center, or emergency room.  If you have a medical emergency, please immediately call 911 or go to the emergency department. In the event of inclement weather, please call our main line at [phone] for an update on the status of any delays or closures.  Dermatology Medication Tips: Please keep the boxes that topical medications come in in  order to help keep track of the instructions about where and how to use these. Pharmacies typically print the medication instructions only on the boxes and not directly on the medication tubes.   If your medication is too expensive, please contact our office at [phone] or send us  a message through MyChart.   We are unable to tell what your co-pay for medications will be in advance as this is different depending on your insurance coverage. However, we may be able to find a substitute medication at lower cost or fill out paperwork to get insurance to cover a needed medication.   If a prior authorization is required to get your medication covered by your insurance company, please allow us  1-2 business days to complete this process.  Drug prices often vary depending on where the prescription is filled and some pharmacies may offer cheaper prices.  The website www.goodrx.com contains coupons for medications through different pharmacies. The prices here do not account for what the cost may be with help from insurance (it may be cheaper with your insurance), but the website can give you the price if you did not use any insurance.  - You can print the associated coupon and take it with your prescription to the pharmacy.  - You may also stop by  our office during regular business hours and pick up a GoodRx coupon card.  - If you need your prescription sent electronically to a different pharmacy, notify our office through [facility] or by phone at [phone]

## 2024-06-14 NOTE — Assessment & Plan Note (Addendum)
 B12, folate, hepatitis panel and HIV

## 2024-06-14 NOTE — Telephone Encounter (Signed)
 Hi Neva, Please send new rx for Hydrocortisone 2.5% cream as an alternate since pimercolimus is not covered.  Thank!

## 2024-06-15 ENCOUNTER — Encounter: Payer: Self-pay | Admitting: Dermatology

## 2024-06-15 ENCOUNTER — Encounter: Payer: Self-pay | Admitting: Rheumatology

## 2024-06-15 ENCOUNTER — Ambulatory Visit: Attending: Rheumatology | Admitting: Rheumatology

## 2024-06-15 VITALS — BP 105/73 | HR 84 | Temp 97.9°F | Resp 14 | Ht 72.0 in | Wt 248.4 lb

## 2024-06-15 DIAGNOSIS — R7689 Other specified abnormal immunological findings in serum: Secondary | ICD-10-CM | POA: Diagnosis not present

## 2024-06-15 DIAGNOSIS — M47816 Spondylosis without myelopathy or radiculopathy, lumbar region: Secondary | ICD-10-CM

## 2024-06-15 DIAGNOSIS — R21 Rash and other nonspecific skin eruption: Secondary | ICD-10-CM

## 2024-06-15 DIAGNOSIS — D696 Thrombocytopenia, unspecified: Secondary | ICD-10-CM

## 2024-06-15 DIAGNOSIS — F419 Anxiety disorder, unspecified: Secondary | ICD-10-CM

## 2024-06-15 DIAGNOSIS — D508 Other iron deficiency anemias: Secondary | ICD-10-CM

## 2024-06-15 MED ORDER — HYDROCORTISONE 2.5 % EX CREA: TOPICAL_CREAM | Freq: Two times a day (BID) | CUTANEOUS | 1 refills | Status: AC

## 2024-06-17 ENCOUNTER — Ambulatory Visit (INDEPENDENT_AMBULATORY_CARE_PROVIDER_SITE_OTHER): Admitting: Licensed Clinical Social Worker

## 2024-06-17 ENCOUNTER — Encounter: Payer: Self-pay | Admitting: Licensed Clinical Social Worker

## 2024-06-17 DIAGNOSIS — F4310 Post-traumatic stress disorder, unspecified: Secondary | ICD-10-CM | POA: Diagnosis not present

## 2024-06-17 LAB — SURGICAL PATHOLOGY
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
## 2024-06-19 LAB — METHYLMALONIC ACID, SERUM: Methylmalonic Acid, Quantitative: 190 nmol/L (ref 0–378)

## 2024-06-21 ENCOUNTER — Ambulatory Visit: Payer: Self-pay

## 2024-06-21 DIAGNOSIS — D508 Other iron deficiency anemias: Secondary | ICD-10-CM

## 2024-06-29 ENCOUNTER — Ambulatory Visit: Admitting: Dermatology

## 2024-06-29 ENCOUNTER — Encounter: Payer: Self-pay | Admitting: Dermatology

## 2024-06-29 VITALS — BP 115/80

## 2024-06-29 DIAGNOSIS — L719 Rosacea, unspecified: Secondary | ICD-10-CM

## 2024-06-29 DIAGNOSIS — Z48817 Encounter for surgical aftercare following surgery on the skin and subcutaneous tissue: Secondary | ICD-10-CM

## 2024-06-29 MED ORDER — IVERMECTIN 1 % EX CREA: TOPICAL_CREAM | CUTANEOUS | 2 refills | Status: AC

## 2024-06-29 MED ORDER — DOXYCYCLINE HYCLATE 50 MG PO CAPS: 50.0000 mg | ORAL_CAPSULE | Freq: Every day | ORAL | 10 refills | Status: AC

## 2024-06-29 MED ORDER — METRONIDAZOLE 1 % EX GEL: CUTANEOUS | 2 refills | Status: AC

## 2024-06-29 MED ORDER — DOXYCYCLINE HYCLATE 100 MG PO TABS: 100.0000 mg | ORAL_TABLET | Freq: Every day | ORAL | 0 refills | Status: AC

## 2024-06-29 NOTE — Patient Instructions (Addendum)
 VISIT SUMMARY:  You came in today for a follow-up after your biopsy confirmed the diagnosis of rosacea. We discussed your symptoms, which have worsened since returning to work, and identified heat and sun exposure as potential triggers. We also reviewed your current skincare routine and discussed treatment options.  YOUR PLAN:  -ROSACEA OF THE FACE:  Rosacea is a skin condition characterized by redness, pimples, and visible blood vessels, often triggered by environmental factors like heat and sun exposure.   To manage your rosacea, you have been prescribed doxycycline 100 mg once daily for one month, then reduce to 50 mg as a maintenance dose.   Additionally, you should apply a mixture of topical ivermectin and metronidazole  twice daily.   Continue using mineral sunscreen, such as Neutrogena ultra sheer or Eucerin Sensitive Skin, to protect your skin from sun-induced flare-ups. We also recommend switching to the Tolerance skincare line for gentle washing and moisturizing.  INSTRUCTIONS:  Please schedule a follow-up appointment in 4-5 months to assess the efficacy of your treatment.       Important Information  Due to recent changes in healthcare laws, you may see results of your pathology and/or laboratory studies on MyChart before the doctors have had a chance to review them. We understand that in some cases there may be results that are confusing or concerning to you. Please understand that not all results are received at the same time and often the doctors may need to interpret multiple results in order to provide you with the best plan of care or course of treatment. Therefore, we ask that you please give us  2 business days to thoroughly review all your results before contacting the office for clarification. Should we see a critical lab result, you will be contacted sooner.   If You Need Anything After Your Visit  If you have any questions or concerns for your doctor, please call  our main line at [phone] If no one answers, please leave a voicemail as directed and we will return your call as soon as possible. Messages left after 4 pm will be answered the following business day.   You may also send us  a message via MyChart. We typically respond to MyChart messages within 1-2 business days.  For prescription refills, please ask your pharmacy to contact our office. Our fax number is [phone].  If you have an urgent issue when the clinic is closed that cannot wait until the next business day, you can page your doctor at the number below.    Please note that while we do our best to be available for urgent issues outside of office hours, we are not available 24/7.   If you have an urgent issue and are unable to reach us , you may choose to seek medical care at your doctor's office, retail clinic, urgent care center, or emergency room.  If you have a medical emergency, please immediately call 911 or go to the emergency department. In the event of inclement weather, please call our main line at [phone] for an update on the status of any delays or closures.  Dermatology Medication Tips: Please keep the boxes that topical medications come in in order to help keep track of the instructions about where and how to use these. Pharmacies typically print the medication instructions only on the boxes and not directly on the medication tubes.   If your medication is too expensive, please contact our office at [phone] or send us  a message through MyChart.  We are unable to tell what your co-pay for medications will be in advance as this is different depending on your insurance coverage. However, we may be able to find a substitute medication at lower cost or fill out paperwork to get insurance to cover a needed medication.   If a prior authorization is required to get your medication covered by your insurance company, please allow us  1-2 business days to complete this  process.  Drug prices often vary depending on where the prescription is filled and some pharmacies may offer cheaper prices.  The website www.goodrx.com contains coupons for medications through different pharmacies. The prices here do not account for what the cost may be with help from insurance (it may be cheaper with your insurance), but the website can give you the price if you did not use any insurance.  - You can print the associated coupon and take it with your prescription to the pharmacy.  - You may also stop by our office during regular business hours and pick up a GoodRx coupon card.  - If you need your prescription sent electronically to a different pharmacy, notify our office through [facility] or by phone at [phone]

## 2024-06-29 NOTE — Progress Notes (Signed)
   Follow-Up Visit   Subjective  Patrick Hahn is a 43 y.o. male established patient who presents for FOLLOW UP on the diagnoses listed below:  Patient (and/or pt guardian) consented to the use of AI-assisted tools for note generation.   Patient was last evaluated on 06/14/24 for rash. Pt is here to discuss Bx results and Tx options.    The following portions of the chart were reviewed this encounter and updated as appropriate: medications, allergies, medical history  Review of Systems:  No other skin or systemic complaints except as noted in HPI or Assessment and Plan.  Objective  Well appearing patient in no apparent distress; mood and affect are within normal limits.   A focused examination was performed of the following areas: cheek   Relevant exam findings are noted in the Assessment and Plan.  Pathology Report Discussed in Detail REPORT OF DERMATOPATHOLOGY FINAL DIAGNOSIS and MICROSCOPIC DESCRIPTION Diagnosis Skin , right malar cheek CHANGES COMPATIBLE WITH ROSACEA   Microscopic Description There is a perivascular and perifollicular inflammatory infiltrate of lymphocytes and histiocytes, associated with telangiectasia. This pattern is not completely specific but is compatible with rosacea in the appropriate clinical setting. Following review of the hematoxylin and eosin sections, a PAS stain was obtained to exclude a fungal infection. The PAS stain is negative for fungal organisms. Multiple levels taken through the submitted block are examined.   Assessment & Plan   Encounter for Removal of Sutures  - Incision site at the R malar cheek is clean, dry and intact. Pt stated that suture came out on its own.  - Discussed pathology results showing rosacea - Patient advised to call with any concerns or if they notice any new or changing lesions.    ROSACEA Exam Mid face erythema with telangiectasias +/- scattered inflammatory papules  wellcontrolled vs notatgoal vs  flared  Patient denies grittiness of the eyes  Confirmed diagnosis of rosacea with papules, likely exacerbated by environmental triggers such as heat and sun exposure. Condition is not systemic and does not involve lupus. Rosacea is characterized by vasomotor instability leading to reactive blood vessels in the superficial skin layer. Untreated rosacea can lead to rhinophyma, especially in men. Topical treatments alone are insufficient; oral antibiotics are necessary to prevent progression. - Prescribed doxycycline  100 mg once daily for one month, then reduce to 50 mg as maintenance dose.  - Prescribed topical ivermectin  and metronidazole  to be mixed and applied twice daily. - Advised use of mineral sunscreen, such as Neutrogena ultra sheer or Eucerin Sensitive Skin, when exposed to sun. - Recommended Tolerance skincare line for gentle washing and moisturizing. - Scheduled follow-up appointment in 4-5 months to assess treatment efficacy.     No follow-ups on file.   Documentation: I have reviewed the above documentation for accuracy and completeness, and I agree with the above.  I, Shirron Maranda, CMA, am acting as scribe for [employer], DO.   Delon Lenis, DO

## 2024-06-30 ENCOUNTER — Ambulatory Visit: Payer: Self-pay | Admitting: Dermatology

## 2024-06-30 ENCOUNTER — Telehealth: Payer: Self-pay

## 2024-06-30 NOTE — Telephone Encounter (Signed)
 Scheduled patient for next appointment. Called and left a voicemail with the details.

## 2024-07-07 ENCOUNTER — Other Ambulatory Visit: Payer: Self-pay | Admitting: Internal Medicine

## 2024-07-09 NOTE — Telephone Encounter (Signed)
 Requested medication (s) are due for refill today: No  Requested medication (s) are on the active medication list: No  Last refill:  04/23/24  Future visit scheduled: Yes  Notes to clinic:  Unable to refuse due to non-delegated medicine. Routing to office for refusal.       Requested Prescriptions  Pending Prescriptions Disp Refills   zolpidem  (AMBIEN  CR) 12.5 MG CR tablet [Pharmacy Med Name: ZOLPIDEM  TART ER 12.5 MG TAB] 15 tablet 1    Sig: TAKE 1 TABLET BY MOUTH EVERY DAY AT BEDTIME AS NEEDED FOR SLEEP     Not Delegated - Psychiatry:  Anxiolytics/Hypnotics Failed - 07/09/2024  4:12 PM      Failed - This refill cannot be delegated      Failed - Urine Drug Screen completed in last 360 days      Passed - Valid encounter within last 6 months    Recent Outpatient Visits           1 month ago Chronic bilateral low back pain without sciatica   [place] [place], [place] W, NP   1 month ago Chronic midline low back pain without sciatica   [place] [facility] [place], Angeline ORN, NP   4 months ago Encounter for general adult medical examination with abnormal findings   [facility] [place], Angeline ORN, NP   5 months ago Dysuria   [place] [facility] [place], Angeline ORN, NP   7 months ago Mass of left axilla    [facility] [place], Angeline ORN, NP       Future Appointments             In 1 month Cheryl, Waddell CHRISTELLA RIGGERS [facility] - A Dept Of [place]

## 2024-08-03 ENCOUNTER — Encounter: Admitting: Rheumatology
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
## 2024-08-19 ENCOUNTER — Ambulatory Visit: Admitting: Physician Assistant

## 2024-08-19 DIAGNOSIS — M47816 Spondylosis without myelopathy or radiculopathy, lumbar region: Secondary | ICD-10-CM

## 2024-08-19 DIAGNOSIS — F419 Anxiety disorder, unspecified: Secondary | ICD-10-CM

## 2024-08-19 DIAGNOSIS — D508 Other iron deficiency anemias: Secondary | ICD-10-CM

## 2024-08-19 DIAGNOSIS — R7689 Other specified abnormal immunological findings in serum: Secondary | ICD-10-CM

## 2024-08-19 DIAGNOSIS — D696 Thrombocytopenia, unspecified: Secondary | ICD-10-CM

## 2024-08-19 DIAGNOSIS — R21 Rash and other nonspecific skin eruption: Secondary | ICD-10-CM

## 2024-08-30 ENCOUNTER — Ambulatory Visit (INDEPENDENT_AMBULATORY_CARE_PROVIDER_SITE_OTHER): Admitting: Internal Medicine

## 2024-08-30 ENCOUNTER — Encounter: Payer: Self-pay | Admitting: Internal Medicine

## 2024-08-30 VITALS — BP 108/74 | Ht 72.0 in | Wt 248.6 lb

## 2024-08-30 DIAGNOSIS — M545 Low back pain, unspecified: Secondary | ICD-10-CM | POA: Diagnosis not present

## 2024-08-30 DIAGNOSIS — Z6833 Body mass index (BMI) 33.0-33.9, adult: Secondary | ICD-10-CM | POA: Diagnosis not present

## 2024-08-30 DIAGNOSIS — L409 Psoriasis, unspecified: Secondary | ICD-10-CM

## 2024-08-30 DIAGNOSIS — G8929 Other chronic pain: Secondary | ICD-10-CM

## 2024-08-30 DIAGNOSIS — L719 Rosacea, unspecified: Secondary | ICD-10-CM | POA: Diagnosis not present

## 2024-08-30 DIAGNOSIS — D508 Other iron deficiency anemias: Secondary | ICD-10-CM | POA: Diagnosis not present

## 2024-08-30 DIAGNOSIS — E66811 Obesity, class 1: Secondary | ICD-10-CM | POA: Diagnosis not present

## 2024-08-30 DIAGNOSIS — F419 Anxiety disorder, unspecified: Secondary | ICD-10-CM | POA: Diagnosis not present

## 2024-08-30 DIAGNOSIS — M25561 Pain in right knee: Secondary | ICD-10-CM

## 2024-08-30 DIAGNOSIS — E6609 Other obesity due to excess calories: Secondary | ICD-10-CM | POA: Diagnosis not present

## 2024-08-30 DIAGNOSIS — D696 Thrombocytopenia, unspecified: Secondary | ICD-10-CM | POA: Diagnosis not present

## 2024-08-30 NOTE — Progress Notes (Signed)
 "  Subjective:    Patient ID: Patrick Hahn, male    DOB: [birth year], 44 y.o.   MRN: [id number]  HPI  Patient presents to clinic today for follow-up of chronic conditions.  Psoriasis: Managed with clobetasol  ointment.  He follows with dermatology.  Anxiety with flying: He takes alprazolam  only as needed when flying.  Iron deficiency anemia: His last H/H was 12.8/34.7, 06/2024.  He is not taking any oral iron at this time.  He follows with hematology.  Chronic back pain: He reports this flares intermittently.  He will take cyclobenzaprine , ibuprofen  or tylenol  OTC as needed.  Lumbar x-ray from 04/2024 reviewed.  He does not follow with orthopedics.  Thrombocytosis: His last platelet count was 207, 06/2024.  He follows with hematology.  Rosacea: Managed with metronidazole  gel, ivermectin  cream and oral doxycycline .  He follows with rheumatology.  He also reports right knee pain. He reports this started 1 week ago. He describes the pain as short intense pain. Typically occurs when walking, hears a pop and hurts for a short time. He denies any specific injury to the area. He has not taken any medication for this but he is wearing a brace.  Review of Systems     Past Medical History:  Diagnosis Date   Anxiety    Chronic back pain    IDA (iron deficiency anemia)    Psoriasis    Thrombocytopenia     Current Outpatient Medications  Medication Sig Dispense Refill   ALPRAZolam  (XANAX ) 0.5 MG tablet Take 1 tablet (0.5 mg total) by mouth daily as needed for anxiety. 10 tablet 0   Azelaic Acid 15 % gel Apply 1 Application topically 2 (two) times daily. 50 g 0   clobetasol  ointment (TEMOVATE ) 0.05 % Apply 1 Application topically 2 (two) times daily. 100 g 3   cyclobenzaprine  (FLEXERIL ) 10 MG tablet Take 1 tablet (10 mg total) by mouth 3 (three) times daily as needed for muscle spasms. 15 tablet 0   doxycycline  (VIBRAMYCIN ) 50 MG capsule Take 1 capsule (50 mg total) by mouth daily. 30 capsule  10   hydrocortisone  2.5 % cream Apply topically 2 (two) times daily. Apply to rash on face up to 2 weeks and then stop 30 g 1   Ivermectin  1 % CREA apply BID to face AM & PM 45 g 2   metroNIDAZOLE  (METROGEL ) 1 % gel apply BID to face AM & PM 60 g 2   zolpidem  (AMBIEN  CR) 12.5 MG CR tablet TAKE 1 TABLET BY MOUTH EVERY DAY AT BEDTIME AS NEEDED FOR SLEEP 15 tablet 1   No current facility-administered medications for this visit.    No Known Allergies  Family History  Problem Relation Age of Onset   Healthy Mother    Prostate cancer Father    Anemia Sister    Anemia Sister    Anemia Sister    Anemia Sister    Healthy Brother    Cancer Maternal Uncle        throat   Breast cancer Maternal Grandmother 41   Asthma Son    Healthy Son    Healthy Daughter    Diabetes Neg Hx    Early death Neg Hx    Heart disease Neg Hx    Stroke Neg Hx     Social History   Socioeconomic History   Marital status: Married    Spouse name: Tyana   Number of children: Not on file   Years of education: Not  on file   Highest education level: Associate degree: occupational, scientist, product/process development, or vocational program  Occupational History   Not on file  Tobacco Use   Smoking status: Never    Passive exposure: Never   Smokeless tobacco: Never  Vaping Use   Vaping status: Never Used  Substance and Sexual Activity   Alcohol use: Yes    Alcohol/week: 1.0 standard drink of alcohol    Types: 1 Shots of liquor per week    Comment: occasional   Drug use: No   Sexual activity: Yes  Other Topics Concern   Not on file  Social History Narrative   Not on file   Social Drivers of Health   Tobacco Use: Low Risk (06/29/2024)   Patient History    Smoking Tobacco Use: Never    Smokeless Tobacco Use: Never    Passive Exposure: Never  Financial Resource Strain: Medium Risk (05/31/2024)   Overall Financial Resource Strain (CARDIA)    Difficulty of Paying Living Expenses: Somewhat hard  Food Insecurity: No Food  Insecurity (06/14/2024)   Epic    Worried About Programme Researcher, Broadcasting/film/video in the Last Year: Never true    Ran Out of Food in the Last Year: Never true  Transportation Needs: No Transportation Needs (06/14/2024)   Epic    Lack of Transportation (Medical): No    Lack of Transportation (Non-Medical): No  Recent Concern: Transportation Needs - Unmet Transportation Needs (06/13/2024)   Epic    Lack of Transportation (Medical): No    Lack of Transportation (Non-Medical): Yes  Physical Activity: Insufficiently Active (05/31/2024)   Exercise Vital Sign    Days of Exercise per Week: 4 days    Minutes of Exercise per Session: 10 min  Stress: Stress Concern Present (05/31/2024)   [employer] of Occupational Health - Occupational Stress Questionnaire    Feeling of Stress: Very much  Social Connections: Socially Isolated (05/31/2024)   Social Connection and Isolation Panel    Frequency of Communication with Friends and Family: Once a week    Frequency of Social Gatherings with Friends and Family: Once a week    Attends Religious Services: Never    Database Administrator or Organizations: No    Attends Engineer, Structural: Not on file    Marital Status: Married  Catering Manager Violence: Not At Risk (06/14/2024)   Epic    Fear of Current or Ex-Partner: No    Emotionally Abused: No    Physically Abused: No    Sexually Abused: No  Depression (PHQ2-9): Low Risk (06/14/2024)   Depression (PHQ2-9)    PHQ-2 Score: 4  Alcohol Screen: Low Risk (05/31/2024)   Alcohol Screen    Last Alcohol Screening Score (AUDIT): 1  Housing: Low Risk (06/14/2024)   Epic    Unable to Pay for Housing in the Last Year: No    Number of Times Moved in the Last Year: 0    Homeless in the Last Year: No  Recent Concern: Housing - High Risk (06/13/2024)   Epic    Unable to Pay for Housing in the Last Year: Yes    Number of Times Moved in the Last Year: 0    Homeless in the Last Year: No  Utilities: Not At Risk  (06/14/2024)   Epic    Threatened with loss of utilities: No  Health Literacy: Not on file     Constitutional: Denies fever, malaise, fatigue, headache or abrupt weight changes.  HEENT: Pt reports  skin lesion of left lower lid. Denies eye pain, eye redness, ear pain, ringing in the ears, wax buildup, runny nose, nasal congestion, bloody nose, or sore throat. Respiratory: Denies difficulty breathing, shortness of breath, cough or sputum production.   Cardiovascular: Denies chest pain, chest tightness, palpitations or swelling in the hands or feet.  Gastrointestinal: Denies abdominal pain, bloating, constipation, diarrhea or blood in the stool.  GU: Denies urgency, frequency, pain with urination, burning sensation, blood in urine, odor or discharge. Musculoskeletal: Patient reports chronic low back pain, right knee pain.  Denies decrease in range of motion, difficulty with gait, or joint swelling.  Skin: Patient reports dry skin, rash of face.  Denies redness, rashes, lesions or ulcercations.  Neurological: Denies dizziness, difficulty with memory, difficulty with speech or problems with balance and coordination.  Psych: Patient reports intermittent anxiety.  Denies depression, SI/HI.  No other specific complaints in a complete review of systems (except as listed in HPI above).  Objective:   Physical Exam BP 108/74 (BP Location: Right Arm, Patient Position: Sitting, Cuff Size: Large)   Ht 6' (1.829 m)   Wt 248 lb 9.6 oz (112.8 kg)   BMI 33.72 kg/m    Wt Readings from Last 3 Encounters:  06/15/24 248 lb 6.4 oz (112.7 kg)  06/14/24 249 lb 8 oz (113.2 kg)  06/08/24 247 lb (112 kg)    General: Appears his stated age, obese, in NAD. Skin: Warm, dry and intact.  Maclopapular rash noted over nasal bridge and bilateral cheeks. HEENT: Head: normal shape and size; Eyes: sclera white, no icterus, conjunctiva pink, PERRLA and EOMs intact, abnormal lesion noted of left lower  lid; Cardiovascular: Normal rate and rhythm. S1,S2 noted.  No murmur, rubs or gallops noted. No JVD or BLE edema.  Pulmonary/Chest: Normal effort and positive vesicular breath sounds. No respiratory distress. No wheezes, rales or ronchi noted.  Musculoskeletal: Pain with palpation over the lumbar spine. Crepitus noted with ROM of the right knee. No joint effusion. No difficulty with gait.  Neurological: Alert and oriented. Coordination normal.  Psychiatric: Mood and affect normal. Behavior is normal. Judgment and thought content normal.     BMET    Component Value Date/Time   NA 140 06/14/2024 1215   K 3.7 06/14/2024 1215   CL 107 06/14/2024 1215   CO2 28 06/14/2024 1215   GLUCOSE 84 06/14/2024 1215   BUN 11 06/14/2024 1215   CREATININE 1.12 06/14/2024 1215   CREATININE 1.02 02/26/2024 0813   CALCIUM 9.0 06/14/2024 1215   GFRNONAA >60 06/14/2024 1215    Lipid Panel     Component Value Date/Time   CHOL 114 02/26/2024 0813   TRIG 74 02/26/2024 0813   HDL 36 (L) 02/26/2024 0813   CHOLHDL 3.2 02/26/2024 0813   VLDL 16.4 09/12/2020 1534   LDLCALC 63 02/26/2024 0813    CBC    Component Value Date/Time   WBC 4.5 06/14/2024 1215   WBC 4.9 02/26/2024 0813   RBC 4.73 06/14/2024 1215   HGB 12.8 (L) 06/14/2024 1215   HCT 34.7 (L) 06/14/2024 1215   PLT 207 06/14/2024 1215   MCV 73.4 (L) 06/14/2024 1215   MCH 27.1 06/14/2024 1215   MCHC 36.9 (H) 06/14/2024 1215   RDW 16.0 (H) 06/14/2024 1215   LYMPHSABS 1.9 06/14/2024 1215   MONOABS 0.5 06/14/2024 1215   EOSABS 0.1 06/14/2024 1215   BASOSABS 0.0 06/14/2024 1215    Hgb A1C Lab Results  Component Value Date  HGBA1C 4.4 02/26/2024            Assessment & Plan:   Right knee pain:  Complicated by obesity Encouraged weight loss as this can help reduce joint pain Offered xray of right knee for further evaluation but he would like to hold off at this time Continue to wear brace for support Can take ibuprofen  OTC  as needed  RTC in 6 months for your annual exam Angeline Laura, NP  "

## 2024-08-30 NOTE — Assessment & Plan Note (Signed)
 Continue doxycycline  50 mg daily, ivermectin  1% cream BID and metrogel  1% gel BID  He will continue to follow with dermatology

## 2024-08-30 NOTE — Assessment & Plan Note (Addendum)
 Complicate by obesity Encourage regular stretching and core strengthening Encouraged weight loss as this can help reduce back pain Okay to take ibuprofen , tylenol  OTC and cyclobenzaprine  10 mg daily as needed

## 2024-08-30 NOTE — Patient Instructions (Signed)
 Knee Exercises Ask your health care provider which exercises are safe for you. Do exercises exactly as told by your health care provider and adjust them as directed. It is normal to feel mild stretching, pulling, tightness, or discomfort as you do these exercises. Stop right away if you feel sudden pain or your pain gets worse. Do not begin these exercises until told by your health care provider. Stretching and range-of-motion exercises These exercises warm up your muscles and joints and improve the movement and flexibility of your knee. These exercises also help to relieve pain and swelling. Knee extension, prone  Lie on your abdomen (prone position) on a bed. Place your left / right knee just beyond the edge of the surface so your knee is not on the bed. You can put a towel under your left / right thigh just above your kneecap for comfort. Relax your leg muscles and allow gravity to straighten your knee (extension). You should feel a stretch behind your left / right knee. Hold this position for __________ seconds. Scoot up so your knee is supported between repetitions. Repeat __________ times. Complete this exercise __________ times a day. Knee flexion, active  Lie on your back with both legs straight. If this causes back discomfort, bend your left / right knee so your foot is flat on the floor. Slowly slide your left / right heel back toward your buttocks. Stop when you feel a gentle stretch in the front of your knee or thigh (flexion). Hold this position for __________ seconds. Slowly slide your left / right heel back to the starting position. Repeat __________ times. Complete this exercise __________ times a day. Quadriceps stretch, prone  Lie on your abdomen on a firm surface, such as a bed or padded floor. Bend your left / right knee and hold your ankle. If you cannot reach your ankle or pant leg, loop a belt around your foot and grab the belt instead. Gently pull your heel toward your  buttocks. Your knee should not slide out to the side. You should feel a stretch in the front of your thigh and knee (quadriceps). Hold this position for __________ seconds. Repeat __________ times. Complete this exercise __________ times a day. Hamstring, supine  Lie on your back (supine position). Loop a belt or towel over the ball of your left / right foot. The ball of your foot is on the walking surface, right under your toes. Straighten your left / right knee and slowly pull on the belt to raise your leg until you feel a gentle stretch behind your knee (hamstring). Do not let your knee bend while you do this. Keep your other leg flat on the floor. Hold this position for __________ seconds. Repeat __________ times. Complete this exercise __________ times a day. Strengthening exercises These exercises build strength and endurance in your knee. Endurance is the ability to use your muscles for a long time, even after they get tired. Quadriceps, isometric This exercise strengthens the muscles in front of your thigh (quadriceps) without moving your knee joint (isometric). Lie on your back with your left / right leg extended and your other knee bent. Put a rolled towel or small pillow under your knee if told by your health care provider. Slowly tense the muscles in the front of your left / right thigh. You should see your kneecap slide up toward your hip or see increased dimpling just above the knee. This motion will push the back of the knee toward the floor.  For __________ seconds, hold the muscle as tight as you can without increasing your pain. Relax the muscles slowly and completely. Repeat __________ times. Complete this exercise __________ times a day. Straight leg raises This exercise strengthens the muscles in front of your thigh (quadriceps) and the muscles that move your hips (hip flexors). Lie on your back with your left / right leg extended and your other knee bent. Tense the  muscles in the front of your left / right thigh. You should see your kneecap slide up or see increased dimpling just above the knee. Your thigh may even shake a bit. Keep these muscles tight as you raise your leg 4-6 inches (10-15 cm) off the floor. Do not let your knee bend. Hold this position for __________ seconds. Keep these muscles tense as you lower your leg. Relax your muscles slowly and completely after each repetition. Repeat __________ times. Complete this exercise __________ times a day. Hamstring, isometric  Lie on your back on a firm surface. Bend your left / right knee about __________ degrees. Dig your left / right heel into the surface as if you are trying to pull it toward your buttocks. Tighten the muscles in the back of your thighs (hamstring) to "dig" as hard as you can without increasing any pain. Hold this position for __________ seconds. Release the tension gradually and allow your muscles to relax completely for __________ seconds after each repetition. Repeat __________ times. Complete this exercise __________ times a day. Hamstring curls If told by your health care provider, do this exercise while wearing ankle weights. Begin with __________lb / kg weights. Then increase the weight by 1 lb (0.5 kg) increments. Do not wear ankle weights that are more than __________lb / kg. Lie on your abdomen with your legs straight. Bend your left / right knee as far as you can without feeling pain. Keep your hips flat against the floor. Hold this position for __________ seconds. Slowly lower your leg to the starting position. Repeat __________ times. Complete this exercise __________ times a day. Squats This exercise strengthens the muscles in front of your thigh and knee (quadriceps). Stand in front of a table, with your feet and knees pointing straight ahead. You may rest your hands on the table for balance but not for support. Slowly bend your knees and lower your hips like you  are going to sit in a chair. Keep your weight over your heels, not over your toes. Keep your lower legs upright so they are parallel with the table legs. Do not let your hips go lower than your knees. Do not bend lower than told by your health care provider. If your knee pain increases, do not bend as low. Hold the squat position for __________ seconds. Slowly push with your legs to return to standing. Do not use your hands to pull yourself to standing. Repeat __________ times. Complete this exercise __________ times a day. Wall slides This exercise strengthens the muscles in front of your thigh and knee (quadriceps). Lean your back against a smooth wall or door, and walk your feet out 18-24 inches (46-61 cm) from it. Place your feet hip-width apart. Slowly slide down the wall or door until your knees bend __________ degrees. Keep your knees over your heels, not over your toes. Keep your knees in line with your hips. Hold this position for __________ seconds. Repeat __________ times. Complete this exercise __________ times a day. Straight leg raises, side-lying This exercise strengthens the muscles that rotate  the leg at the hip and move it away from your body (hip abductors). Lie on your side with your left / right leg in the top position. Lie so your head, shoulder, knee, and hip line up. You may bend your bottom knee to help you keep your balance. Roll your hips slightly forward so your hips are stacked directly over each other and your left / right knee is facing forward. Leading with your heel, lift your top leg 4-6 inches (10-15 cm). You should feel the muscles in your outer hip lifting. Do not let your foot drift forward. Do not let your knee roll toward the ceiling. Hold this position for __________ seconds. Slowly return your leg to the starting position. Let your muscles relax completely after each repetition. Repeat __________ times. Complete this exercise __________ times a  day. Straight leg raises, prone This exercise stretches the muscles that move your hips away from the front of the pelvis (hip extensors). Lie on your abdomen on a firm surface. You can put a pillow under your hips if that is more comfortable. Tense the muscles in your buttocks and lift your left / right leg about 4-6 inches (10-15 cm). Keep your knee straight as you lift your leg. Hold this position for __________ seconds. Slowly lower your leg to the starting position. Let your leg relax completely after each repetition. Repeat __________ times. Complete this exercise __________ times a day. This information is not intended to replace advice given to you by your health care provider. Make sure you discuss any questions you have with your health care provider. Document Revised: 04/10/2021 Document Reviewed: 04/10/2021 Elsevier Patient Education  2024 [employer].

## 2024-08-30 NOTE — Assessment & Plan Note (Signed)
 Continue alprazolam  0.5 mg daily as needed for flying

## 2024-08-30 NOTE — Assessment & Plan Note (Addendum)
 Continue clobetasol  0.05 % BID as needed

## 2024-08-30 NOTE — Assessment & Plan Note (Signed)
 Encourage diet and exercise for weight loss

## 2024-08-30 NOTE — Assessment & Plan Note (Signed)
 CBC reviewed He will continue to follow with hematology

## 2024-09-06 ENCOUNTER — Other Ambulatory Visit: Payer: Self-pay | Admitting: Internal Medicine

## 2024-09-07 ENCOUNTER — Ambulatory Visit: Admitting: Rheumatology

## 2024-09-07 NOTE — Telephone Encounter (Signed)
 Requested medication (s) are due for refill today - yes  Requested medication (s) are on the active medication list -yes  Future visit scheduled -yes  Last refill: 07/12/24 #15 1RF  Notes to clinic: non delegated Rx  Requested Prescriptions  Pending Prescriptions Disp Refills   zolpidem  (AMBIEN  CR) 12.5 MG CR tablet [Pharmacy Med Name: ZOLPIDEM  TART ER 12.5 MG TAB] 15 tablet 1    Sig: TAKE 1 TABLET BY MOUTH AT BEDTIME AS NEEDED FOR SLEEP     Not Delegated - Psychiatry:  Anxiolytics/Hypnotics Failed - 09/07/2024  4:12 PM      Failed - This refill cannot be delegated      Failed - Urine Drug Screen completed in last 360 days      Passed - Valid encounter within last 6 months    Recent Outpatient Visits           1 week ago Psoriasis   [place] [facility] [place], [place] W, NP   3 months ago Chronic bilateral low back pain without sciatica   [place] [facility] [place], [place] W, NP   3 months ago Chronic midline low back pain without sciatica   [place] [facility] [place], Angeline ORN, NP   6 months ago Encounter for general adult medical examination with abnormal findings   [place] [facility] [place], Angeline ORN, NP   7 months ago Dysuria   [facility] [place], [place], NP                 Requested Prescriptions  Pending Prescriptions Disp Refills   zolpidem  (AMBIEN  CR) 12.5 MG CR tablet [Pharmacy Med Name: ZOLPIDEM  TART ER 12.5 MG TAB] 15 tablet 1    Sig: TAKE 1 TABLET BY MOUTH AT BEDTIME AS NEEDED FOR SLEEP     Not Delegated - Psychiatry:  Anxiolytics/Hypnotics Failed - 09/07/2024  4:12 PM      Failed - This refill cannot be delegated      Failed - Urine Drug Screen completed in last 360 days      Passed - Valid encounter within last 6 months    Recent Outpatient Visits           1 week ago Psoriasis   [facility] [place], [place] W, NP    3 months ago Chronic bilateral low back pain without sciatica   [place] [facility] [place], [place] W, NP   3 months ago Chronic midline low back pain without sciatica   [place] [facility] [place], Angeline ORN, NP   6 months ago Encounter for general adult medical examination with abnormal findings   [place] [facility] [place], Angeline ORN, NP   7 months ago Dysuria   [facility] [place], Angeline ORN, [place]

## 2024-09-14 ENCOUNTER — Other Ambulatory Visit: Payer: Self-pay | Admitting: Internal Medicine

## 2024-09-14 DIAGNOSIS — N6489 Other specified disorders of breast: Secondary | ICD-10-CM

## 2024-09-14 DIAGNOSIS — R928 Other abnormal and inconclusive findings on diagnostic imaging of breast: Secondary | ICD-10-CM

## 2024-09-14 DIAGNOSIS — N62 Hypertrophy of breast: Secondary | ICD-10-CM

## 2024-10-26 ENCOUNTER — Inpatient Hospital Stay

## 2024-10-29 ENCOUNTER — Inpatient Hospital Stay

## 2024-11-24 ENCOUNTER — Ambulatory Visit: Admitting: Dermatology

## 2025-02-28 ENCOUNTER — Encounter: Admitting: Internal Medicine
